# Patient Record
Sex: Female | Born: 1940 | Race: White | Hispanic: No | Smoking: Current every day smoker
Health system: Southern US, Community
[De-identification: ages and names within clinical notes are randomized; demographics above are authoritative.]

## PROBLEM LIST (undated history)

## (undated) DIAGNOSIS — E079 Disorder of thyroid, unspecified: Secondary | ICD-10-CM

## (undated) DIAGNOSIS — I5032 Chronic diastolic (congestive) heart failure: Secondary | ICD-10-CM

## (undated) DIAGNOSIS — R000 Tachycardia, unspecified: Secondary | ICD-10-CM

## (undated) DIAGNOSIS — J449 Chronic obstructive pulmonary disease, unspecified: Secondary | ICD-10-CM

## (undated) DIAGNOSIS — F419 Anxiety disorder, unspecified: Secondary | ICD-10-CM

## (undated) DIAGNOSIS — F329 Major depressive disorder, single episode, unspecified: Secondary | ICD-10-CM

## (undated) DIAGNOSIS — F32A Depression, unspecified: Secondary | ICD-10-CM

## (undated) DIAGNOSIS — E039 Hypothyroidism, unspecified: Secondary | ICD-10-CM

## (undated) DIAGNOSIS — G8929 Other chronic pain: Secondary | ICD-10-CM

## (undated) HISTORY — PX: CHOLECYSTECTOMY: SHX55

## (undated) HISTORY — PX: TONSILLECTOMY: SUR1361

## (undated) HISTORY — PX: ABDOMINAL HYSTERECTOMY: SHX81

---

## 1999-01-16 ENCOUNTER — Inpatient Hospital Stay (HOSPITAL_COMMUNITY): Admission: AD | Admit: 1999-01-16 | Discharge: 1999-01-17 | Payer: Self-pay | Admitting: Internal Medicine

## 1999-01-17 ENCOUNTER — Encounter: Payer: Self-pay | Admitting: Internal Medicine

## 2000-06-05 ENCOUNTER — Encounter: Payer: Self-pay | Admitting: Gastroenterology

## 2000-06-05 ENCOUNTER — Ambulatory Visit (HOSPITAL_COMMUNITY): Admission: RE | Admit: 2000-06-05 | Discharge: 2000-06-05 | Payer: Self-pay | Admitting: Gastroenterology

## 2000-11-02 ENCOUNTER — Emergency Department (HOSPITAL_COMMUNITY): Admission: EM | Admit: 2000-11-02 | Discharge: 2000-11-02 | Payer: Self-pay | Admitting: Emergency Medicine

## 2000-11-02 ENCOUNTER — Encounter: Payer: Self-pay | Admitting: Emergency Medicine

## 2000-11-09 ENCOUNTER — Inpatient Hospital Stay (HOSPITAL_COMMUNITY): Admission: AD | Admit: 2000-11-09 | Discharge: 2000-11-12 | Payer: Self-pay | Admitting: Family Medicine

## 2001-01-27 ENCOUNTER — Ambulatory Visit (HOSPITAL_COMMUNITY): Admission: RE | Admit: 2001-01-27 | Discharge: 2001-01-27 | Payer: Self-pay | Admitting: Internal Medicine

## 2001-01-27 ENCOUNTER — Encounter: Payer: Self-pay | Admitting: Internal Medicine

## 2002-01-19 ENCOUNTER — Encounter: Payer: Self-pay | Admitting: Family Medicine

## 2002-01-19 ENCOUNTER — Ambulatory Visit (HOSPITAL_COMMUNITY): Admission: RE | Admit: 2002-01-19 | Discharge: 2002-01-19 | Payer: Self-pay | Admitting: Family Medicine

## 2003-01-19 ENCOUNTER — Encounter: Payer: Self-pay | Admitting: Emergency Medicine

## 2003-01-19 ENCOUNTER — Inpatient Hospital Stay (HOSPITAL_COMMUNITY): Admission: EM | Admit: 2003-01-19 | Discharge: 2003-01-24 | Payer: Self-pay | Admitting: Emergency Medicine

## 2003-01-24 ENCOUNTER — Encounter: Payer: Self-pay | Admitting: Cardiology

## 2003-04-05 ENCOUNTER — Emergency Department (HOSPITAL_COMMUNITY): Admission: EM | Admit: 2003-04-05 | Discharge: 2003-04-05 | Payer: Self-pay | Admitting: Emergency Medicine

## 2003-06-28 ENCOUNTER — Ambulatory Visit (HOSPITAL_COMMUNITY): Admission: RE | Admit: 2003-06-28 | Discharge: 2003-06-28 | Payer: Self-pay | Admitting: *Deleted

## 2004-06-10 ENCOUNTER — Ambulatory Visit (HOSPITAL_COMMUNITY): Admission: RE | Admit: 2004-06-10 | Discharge: 2004-06-10 | Payer: Self-pay | Admitting: Family Medicine

## 2006-09-16 ENCOUNTER — Emergency Department (HOSPITAL_COMMUNITY): Admission: EM | Admit: 2006-09-16 | Discharge: 2006-09-16 | Payer: Self-pay | Admitting: Emergency Medicine

## 2008-05-28 ENCOUNTER — Emergency Department (HOSPITAL_COMMUNITY): Admission: EM | Admit: 2008-05-28 | Discharge: 2008-05-28 | Payer: Self-pay | Admitting: Emergency Medicine

## 2008-06-09 ENCOUNTER — Ambulatory Visit: Payer: Self-pay | Admitting: Cardiothoracic Surgery

## 2009-05-10 ENCOUNTER — Ambulatory Visit (HOSPITAL_COMMUNITY): Admission: RE | Admit: 2009-05-10 | Discharge: 2009-05-10 | Payer: Self-pay | Admitting: Family Medicine

## 2010-06-02 ENCOUNTER — Encounter: Payer: Self-pay | Admitting: Cardiothoracic Surgery

## 2010-08-26 LAB — COMPREHENSIVE METABOLIC PANEL WITH GFR
ALT: 14 U/L (ref 0–35)
AST: 17 U/L (ref 0–37)
Albumin: 4.1 g/dL (ref 3.5–5.2)
Alkaline Phosphatase: 99 U/L (ref 39–117)
BUN: 12 mg/dL (ref 6–23)
CO2: 29 meq/L (ref 19–32)
Calcium: 9.6 mg/dL (ref 8.4–10.5)
Chloride: 103 meq/L (ref 96–112)
Creatinine, Ser: 0.65 mg/dL (ref 0.4–1.2)
GFR calc non Af Amer: >60 mL/min
Glucose, Bld: 103 mg/dL — ABNORMAL HIGH (ref 70–99)
Potassium: 3.8 meq/L (ref 3.5–5.1)
Sodium: 139 meq/L (ref 135–145)
Total Bilirubin: 0.5 mg/dL (ref 0.3–1.2)
Total Protein: 7.1 g/dL (ref 6.0–8.3)

## 2010-08-26 LAB — DIFFERENTIAL
Eosinophils Absolute: 0.1 10*3/uL (ref 0.0–0.7)
Lymphs Abs: 2.8 10*3/uL (ref 0.7–4.0)
Monocytes Absolute: 0.4 10*3/uL (ref 0.1–1.0)
Monocytes Relative: 5 % (ref 3–12)
Neutrophils Relative %: 62 % (ref 43–77)

## 2010-08-26 LAB — CBC
HCT: 39.5 % (ref 36.0–46.0)
Hemoglobin: 13.1 g/dL (ref 12.0–15.0)
MCHC: 33 g/dL (ref 30.0–36.0)
MCV: 88.4 fL (ref 78.0–100.0)
Platelets: 420 K/uL — ABNORMAL HIGH (ref 150–400)
RBC: 4.47 MIL/uL (ref 3.87–5.11)
RDW: 16.1 % — ABNORMAL HIGH (ref 11.5–15.5)
WBC: 8.7 K/uL (ref 4.0–10.5)

## 2010-09-06 ENCOUNTER — Ambulatory Visit (HOSPITAL_COMMUNITY)
Admission: RE | Admit: 2010-09-06 | Discharge: 2010-09-06 | Disposition: A | Discharge status: Home / Self Care | Payer: Medicare Other | Source: Ambulatory Visit | Attending: Family Medicine | Admitting: Family Medicine

## 2010-09-06 ENCOUNTER — Other Ambulatory Visit (HOSPITAL_COMMUNITY): Payer: Self-pay | Admitting: Family Medicine

## 2010-09-06 DIAGNOSIS — M25519 Pain in unspecified shoulder: Secondary | ICD-10-CM | POA: Insufficient documentation

## 2010-09-06 DIAGNOSIS — M25511 Pain in right shoulder: Secondary | ICD-10-CM

## 2010-09-24 NOTE — Consult Note (Signed)
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  The  patient was recently hospitalized at [facility] with abdominal  pain and a CT scan was performed.  The CT scan of the abdomen showed  some nonspecific findings in the abdomen and a small area of air fluid  levels in the right colon.  The cuts through the thoracic aorta showed a  ascending aorta measuring 4.7 cm x 4.4 cm.  The descending thoracic  aorta measures 2.6 cm.  There is no evidence of dissection, false lumen,  or penetrating ulcer of the aorta.  The abdominal aorta shows no  significant abnormalities.  Because of her abnormal CT scan, a CT  surgical evaluation was requested.   The patient previously has been seen and evaluated for her ascending  aortic disease and in 2004 and 2005, she had CT scan by [facility].  The ascending aorta at this time measured 4.5 cm.  The  patient has been a heavy smoker and has hyperlipidemia and is currently  not taking her Vytorin due to concern over side effects as seen on TV.  She denies any significant chest pain but does have palpitations.  She  denies any significant symptoms of CHF or fluid retention.  She did have  a 2-D echo in 2004 which showed a normal EF and no significant valve  disease.  She also had a left heart cath at that time which showed no  significant coronary disease, but there was some dilatation of the  ascending aorta.   PAST MEDICAL HISTORY:  1. History of DVT in the left leg with pulmonary embolus in 2004      treated with over a year of Coumadin,  now resolved.  2. Hyperlipidemia.  3. GERD.  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  5. Degenerative arthritis.  6. Status post cholecystectomy and abdominal hysterectomy.   CURRENT MEDICATIONS:  1. Doxepin 100 mg nightly.  2. Ativan 2 mg t.i.d.  3. Albuterol inhaler p.r.n.  4. Aspirin a 325 mg daily.  5. Hydrocodone p.r.n. pain.  6. Prevacid 50 mg daily.  7. Vytorin 10/41 daily.  8. Flexeril 10 mg p.r.n.   ALLERGIES/INTOLERANCE:  DARVOCET causes her to have a feeling of  suffocation.   SOCIAL HISTORY:  The patient is married.  She is retired.  He smokes  least a pack of cigarettes a day.   FAMILY HISTORY:  No history of aneurysmal disease.  Positive history of  coronary disease and MI.   REVIEW OF SYSTEMS:  No fever, weight loss, productive cough, or  hemoptysis.  No history of thoracic trauma or abnormal pulmonary nodules  on her x-ray studies.  No active dental problems and she has a full  upper plate and intact remaining mandibular teeth.  No difficulty  swallowing.  No hepatitis, jaundice, or blood per rectum.  No history of  kidney stones or hematuria.  No history of diabetes or thyroid disease.  No history of bleeding complications from prior Coumadin therapy or  history of blood transfusions.  Positive history of DVT in her left leg,  not related to an injury or operation.  Positive history of leg pain at  night.  Negative history of stroke, seizure, or TIA.   PHYSICAL EXAMINATION:  The patient is 5 feet 6 and weighs 131 pounds.  Blood pressure 108/70, pulse 86, respirations 18, saturation room air is  94%.  She is anxious and she is accompanied by her daughter.  HEENT exam  is normocephalic with upper dental plate.  Pharynx clear.  Neck US  without JVD, mass, or bruit.  Lymphatics show no palpable  supraclavicular or cervical adenopathy.  Thorax is without deformity.  Breath sounds are distant, but clear.  Cardiac rhythm is regular without  S3,  gallop, murmur, or rub.  Abdominal exam is soft, well-healed lower  midline incision from her hysterectomy.  There is no pulsatile mass.  Extremities reveal mild clubbing but no cyanosis or edema.  Peripheral  pulses are intact in all extremities.  There is no evidence of venous  insufficiency of the lower extremities.  Her neurologic exam is intact  and she is alert and oriented.   LABORATORY DATA:  Reviewed her CT scans dating back to 2004 at which  time she had a 4.5-cm fusiform ascending aneurysm.  This is not  significantly changed over the past 6 years measuring now 4.6 cm.   IMPRESSION AND RECOMMENDATIONS:  Surgical intervention of a fusiform  ascending aorta were usually reserved for a aortic diameter of 5 cm or  greater unless there is a strong family history of dissection.  I have  recommended smoking cessation and that she resume taking her cholesterol  medication as well as aspirin 81 mg a day and again smoking cessation.  Her ascending aorta has been fairly stable over 6 years, so I will plan  on seeing her back with a CT scan of her thoracic aorta in 1 year.  She  understands that she is at a small but definite risk of dissection of  about 5% and she should report any symptoms of intrascapular back pain  or severe chest pain immediately.  Smoking cessation would also have a  significant positive impact on her operative recovery if she does come  to Surgery.  All questions were addressed and the patient agrees to  return for the CT scan in 1 year's time.   Kerin Perna, M.D.  Electronically Signed   PV/MEDQ  D:  06/09/2008  T:  06/10/2008  Job:  [id number]   cc:   Kirk Ruths, M.D.  [facility] Office

## 2010-09-27 NOTE — H&P (Signed)
NAME:  [name], [name]                      ACCOUNT NO.:  [id number]   MEDICAL RECORD NO.:  [id number]                   PATIENT TYPE:  INP   LOCATION:  A226                                 FACILITY:  [facility]   PHYSICIAN:  Thomas C. Wall, M.D.                DATE OF BIRTH:  1941-03-05   DATE OF ADMISSION:  01/19/2003  DATE OF DISCHARGE:                                HISTORY & PHYSICAL   Mrs. Ashley Houston is a 70 year old white female who has a history of DVD and  pulmonary emboli as well as gastroesophageal reflux.  She reported having  substernal chest pressure and heaviness radiating into her left arm with  left arm numbness and shortness of breath while walking up the stairs  yesterday.  It was relieved with 1 sublingual nitroglycerin by EMS.  It was  relieved completely by 1 sublingual nitroglycerin in the emergency room.   Since admission, she has had a negative EKG except for nonspecific ST  segment changes.  She has had negative cardiac enzymes x 3.   Her risk factors are significant and positive for age, tobacco use, and  hyperlipidemia.   ALLERGIES:  She is intolerant of DARVOCET.   MEDICATIONS PRIOR TO ADMISSION:  1. Prevacid 30 mg a day.  2. Hydrocodone APAP p.r.n.  3. Lorazepam 2 mg p.r.n.  4. Coumadin 5 mg q.h.s.  5. Doxepin HCl 10 mg q.h.s.  6. Albuterol negative q.6h. p.r.n.  7. Lescol 80 mg q.h.s.   She has a history of anxiety.  She became quite tearful today when we talked  to her.   She had a cardiac catheterization in 2000.  She had minor irregularities to  the proximal right coronary artery and normal left ventricular function.  She had a slight dilatation of the proximal distal aorta with normal renal  vessels.   She has had a cholecystectomy and hysterectomy.   SOCIAL HISTORY:  She lives in [place] with her husband.  She has five children.  She is married and retired.  She does not drink.   FAMILY HISTORY:  Remarkable for no premature history of  coronary disease.   REVIEW OF SYSTEMS:  Really unremarkable.  Her reflux symptoms have been  under good control and do not feel similar to what she had yesterday as  described in the HPI.   PHYSICAL EXAMINATION:  VITAL SIGNS:  Blood pressure 106/57, pulse 78 and  regular, temperature 96.7, respiratory rate 20, O2 saturation 91% on 1  liter.  Weight 147/7.  GENERAL:  She is in no acute distress.  She is quite anxious and tearful,  however, after being told she needs catheterization.  HEENT:  Unremarkable.  NECK:  No JVD.  Carotid upstrokes are equal bilaterally without bruits.  There is no thyromegaly.  LUNGS: Relatively clear except for decreased breath sounds throughout.  HEART:  Regular rate and rhythm without gallop, rub, or murmur.  ABDOMEN:  Soft with good bowel sounds.  EXTREMITIES:  No cyanosis, clubbing, or edema.  Pulses are intact.  NEUROLOGIC:  Exam is intact.   LABORATORY DATA:  Chest x-ray shows no acute cardiopulmonary disease.   LABORATORY DATA:  Remarkable for AST and ALT being slightly up.  This may be  secondary to drug.  Alkaline phosphatase and total bilirubin were normal.  Total protein and albumin are normal.   ASSESSMENT:  1. Chest discomfort consistent with angina.  She has significant risk     factors.  She had nonobstructive plaque on catheterization in 2000.     Repeat catheterization has been recommended.  We are waiting for a bed at     [facility].  She and her husband are in agreement with this plan.  2. Hyperlipidemia.  3. Tobacco use.  4. History of deep vein thrombosis and pulmonary emboli on Coumadin.  She     was subtherapeutic on admission.  D-dimer is normal.  5. Gastroesophageal reflux.  This is under good control on her current     medications.  6. Elevated transaminase.  Most likely culprit would be her Lescol.  We will     discontinue this for now and reassess liver function tests again in about     a week.                                                 Thomas C. Wall, M.D.    TCW/MEDQ  D:  01/20/2003  T:  01/21/2003  Job:  [id number]   cc:   Kirk Ruths, M.D.  [address]  [place]  [place] [address]  Fax: [phone]   Duke Salvia, M.D.

## 2010-09-27 NOTE — Discharge Summary (Signed)
   NAME:  EVVA, DIN                      ACCOUNT NO.:  [id number]   MEDICAL RECORD NO.:  [id number]                   PATIENT TYPE:  INP   LOCATION:  4740                                 FACILITY:  [facility]   PHYSICIAN:  Corrie Mckusick, M.D.               DATE OF BIRTH:  [birth year]   DATE OF ADMISSION:  01/19/2003  DATE OF DISCHARGE:  01/23/2003                                 DISCHARGE SUMMARY   DISPOSITION:  Transferred to [facility] on January 23, 2003.   HISTORY OF PRESENT ILLNESS AND PAST MEDICAL HISTORY:  Please see admission  H&P.   HOSPITAL COURSE:  A 70 year old female with history of DVT and pulmonary  embolism with mild chronic obstructive pulmonary disease who presented with  chest pain.  She was admitted for rule out protocol and the [facility]  was consulted.  Catheterization status was reviewed which was negative in  2000.  She was on Coumadin on admission.  CPK and troponins were all  negative x3.   The day after admission the patient had no chest pain and felt quite well.  It was decided after rule out protocol that she should be set up for cardiac  catheterization.  The [place] group set her up for transfer on September 13  for catheterization.  INR had normalized to 1.1 on that day.  Vital signs  had otherwise remained stable and the patient was chest-pain free over the  weekend.   CONDITION ON TRANSFER:  Stable.     ___________________________________________                                         Corrie Mckusick, M.D.   JCG/MEDQ  D:  03/22/2003  T:  03/22/2003  Job:  [id number]

## 2010-09-27 NOTE — Cardiovascular Report (Signed)
Ashley Houston, Ashley Houston                      ACCOUNT NO.:  [id number]   MEDICAL RECORD NO.:  [id number]                   PATIENT TYPE:  INP   LOCATION:  4740                                 FACILITY:  [facility]   PHYSICIAN:  Veneda Melter, M.D.                   DATE OF BIRTH:  11/22/1940   DATE OF PROCEDURE:  01/23/2003  DATE OF DISCHARGE:                              CARDIAC CATHETERIZATION   PROCEDURES PERFORMED:  1. Left heart catheterization.  2. Left ventriculogram.  3. Selective coronary angiography.  4. Aortic root angiogram.  5. Abdominal aortogram.   DIAGNOSES:  1. Mild coronary artery disease by angiogram.  2. Normal left ventricular systolic function.  3. Thoracic aortic aneurysm.   CARDIOLOGIST:  Veneda Melter, M.D.   HISTORY:  Ashley Houston is a 70 year old female with a long history of  tobacco use who presented with substernal chest discomfort.  The patient was  admitted to the hospital and ruled out for acute infarction.  She had a  prior cardiac catheterization in September 2000 showing only mild disease.  She also has a history of deep venous thrombosis requiring anticoagulation  with Coumadin.   TECHNIQUE:  Informed consent was obtained.  The patient was brought to the  catheterization lab. A 6 French sheath was placed in the right femoral  artery using the modified Seldinger technique.  A 6 [place] JL-4 catheter was  introduced and attempts were made to engage the left coronary artery;  however, due to aortic root enlargement this was unsuccessful, and an AL-2  catheter was used to engage the left coronary artery.  Selective angiography  was performed in various projections using manual injection of contrast.  A  JR-4 catheter was then introduced and used to engaged the right coronary  artery; again, selective angiography was performed. Subsequently a 6 French  pigtail catheter was advanced into the left ventricle and left  ventriculogram was performed using  power injection of contrast.  Pigtail  catheter was brought back into the ascending aorta and aortic root angiogram  performed using power injection of contrast.  The pigtail catheter was  brought back into the abdominal aorta and abdominal aortogram performed  using power injection of contrast.   At the termination of the case catheters abdomen sheaths were removed.  Manuel pressure was applied until adequate hemostasis was achieved.   The patient tolerated the procedure well and was transferred to the floor in  stable condition.   FINDINGS:  Findings are as follows:   1. Left Main Trunk:  Large caliber vessel with mild irregularities.   1. LAD:  The LAD is a medium caliber vessel that provides a medium-sized     first diagonal branch in the midsection.  The LAD has mid disease of 30%.   1. Left Circumflex Artery:  This is a large caliber vessel that provides a     medium caliber first marginal  branch in the proximal segment and large     second marginal branch in the midsection.  The left circumflex system had     diffuse disease of 30%.   1. Right Coronary:  Dominant, medium  caliber vessel that provides the     posterior descending artery and several posterior ventricular branches at     the terminal segment.  The right coronary has mild disease of 30% in the     midsection.   1. LV:  Normal end systolic and end diastolic dimensions.  Overall left     ventricular function is well-preserved.  The ejection fraction was     greater than 55%.  No mitral regurgitation.  Left ventricular pressure     124/8, aortic was 125/68 and LVEDP was 15.   1. Aortic Root:  This is dilated.  Evidence of dilatation is noted through     the ascending and descending segments up to the diaphragm.  The origin of     the great vessels appears normal.   1. The abdominal aorta is of normal caliber vessel.  There is moderate     atheromatous disease without critical stenosis or dilatation.  The  renal     arteries are single and patent bilaterally.  The iliac arteries have mild     disease.   ASSESSMENT/PLAN:  Ashley Houston is a 70 year old female with mild coronary  artery disease and well-preserved left ventricular function.  By angiogram  she has dilated aortic root and ascending aorta, which will be sized using  imaging study.  She has had a history of pulmonary embolus; consideration  should be given towards discontinuation of the Coumadin.  Should she be at  high risk for recurrent pulmonary embolus placement of an IVC filter will be  considered.                                                 Veneda Melter, M.D.    NG/MEDQ  D:  01/23/2003  T:  01/23/2003  Job:  [id number]   cc:   Kirk Ruths, M.D.  [address]  [place]  [place] [address]  Fax: [phone]   Jesse Sans. Wall, M.D.   Duke Salvia, M.D.

## 2010-09-27 NOTE — Discharge Summary (Signed)
NAME:  Ashley Houston, Ashley Houston                      ACCOUNT NO.:  [id number]   MEDICAL RECORD NO.:  [id number]                   PATIENT TYPE:  INP   LOCATION:  4740                                 FACILITY:  [facility]   PHYSICIAN:  Thomas C. Wall, M.D.                DATE OF BIRTH:  09-Feb-1941   DATE OF ADMISSION:  01/23/2003  DATE OF DISCHARGE:  01/24/2003                           DISCHARGE SUMMARY - REFERRING   PROCEDURES:  1. Cardiac catheterization.  2. Coronary arteriogram.  3. Left ventriculogram.  4. CT scan of the chest with contrast.   HOSPITAL COURSE:  [name] is a 70 year old female with a history of  DVT as well as pulmonary embolus and reflux disease who went to [facility] for evaluation of chest pain.  She was admitted there and ruled out  for an MI.  She had a catheterization in 2000 which showed moderate  irregularities but otherwise normal coronaries and a normal LV.  At that  time she also had slight dilatation of the proximal and distal aorta but no  renal artery stenosis.  She was evaluated at [facility] and it was  felt that further evaluation was needed so she was transferred to [facility] for cardiac catheterization.   [name] had a cardiac catheterization on January 23, 2003 and it  showed nonobstructive coronary disease in the LAD, circumflex and RCA.  Her  EF was greater than 55% with no MR.  She had mild irregularities in her  abdominal aorta but her ascending aorta was dilated.  CT scan was ordered to  size her thoracic aorta and to rule out PE as well.   The chest CT scan was performed with contrast and it showed no pulmonary  embolus.  There was no dissection.  She had a dilated ascending aorta that  was 4.5 cm in the maximum diameter.   [name] had been on Coumadin prior to admission for her history of PE.  However, this was discontinued and she was started on a full-strength  aspirin every day.  Her  catheter site was without hematoma, ecchymosis or  bruit. She was ambulating without chest pain or shortness of breath and  considered stable for discharge on January 24, 2003.  She will be followed  closely for her aortic root aneurysm with followup CT scans at regular  intervals.   LABORATORY DATA:  Hemoglobin 13.5, hematocrit 40.3, WBC 9.1, platelets 346.  Sodium 139, potassium 3.9, chloride 104, CO2 29, BUN 13, creatinine 0.7,  glucose 96.  Serial CK-MB and troponin are negative for MI.  D-dimer within  normal limits at 0.26.   Chest x-ray (performed at [facility]): Heart size and vascularity are  normal.  Lungs are hyperinflated with slight flattening of the diaphragm and  accentuation of the interstitial markings on a chronic basis.  Findings are  consistent with chronic interstitial and obstructive disease with  no acute  problems.   CONDITION ON DISCHARGE:  Stable.   DISCHARGE DIAGNOSES:  1. Chest pain, negative pulmonary embolus by CT scan and no obstructive     coronary artery disease by catheterization this admission.  2. Preserved left ventricular function with an ejection fraction greater     than 55% by catheterization this admission.  3. History of deep venous thrombosis and pulmonary embolus.  4. Hyperlipidemia.  5. Gastroesophageal reflux disease.  6. Anxiety.  7. Chronic obstructive pulmonary disease/interstitial lung disease.  8. Allergies to DARVOCET.  9. Status post cholecystectomy and hysterectomy.  10.      Arthritis.   DISCHARGE INSTRUCTIONS:  Her activity level is to include no driving, sexual  or strenuous activity for two days.  She was advised not to overexert  herself but it is okay to start a walking program at a low level next week.  She is to stick to a low-fat diet and not use tobacco.  She is to call the  office for problems with the cath site.  She has an appointment with a  physician and she is to check with Dr. Daleen Squibb on September 27 at 2 p.m.   Dr.  Daleen Squibb will determine the frequency of the CT scans for her aneurysm.  She is  to follow up with Dr. Regino Schultze as scheduled.   DISCHARGE MEDICATIONS:  1. Aspirin 325 mg daily.  2. Prevacid 30 mg daily.  3. Ativan 2 mg b.i.d.  4. Doxepin 100 mg q.h.s.  5. Albuterol MDI p.r.n.  6. Lescol 80 mg daily.  7. She is not to take Coumadin.         Lavella Hammock, P.A. [facility]                  Thomas C. Wall, M.D.    RG/MEDQ  D:  01/24/2003  T:  01/24/2003  Job:  [id number]   cc:   Kirk Ruths, M.D.  [address]  [place]  [place] [address]  Fax: [phone]

## 2010-09-27 NOTE — H&P (Signed)
NAME:  Ashley Houston, Ashley Houston                      ACCOUNT NO.:  [id number]   MEDICAL RECORD NO.:  [id number]                   PATIENT TYPE:  INP   LOCATION:  A226                                 FACILITY:  [facility]   PHYSICIAN:  Corrie Mckusick, M.D.               DATE OF BIRTH:  1941/04/16   DATE OF ADMISSION:  01/19/2003  DATE OF DISCHARGE:                                HISTORY & PHYSICAL   ADMITTING DIAGNOSIS:  Chest pain.   HISTORY OF PRESENT ILLNESS:  [age]-year-old female with 2 days of  intermittent left-sided chest pain, some radiation to the left arm, some  associated shortness of breath, no nausea, vomiting, or diaphoresis.  Patient does have some underlying mild COPD.  She has had a DVT and  resultant pulmonary embolism 4 to 5 years ago, and is on Coumadin for that.  She had no acute shortness of breath.  She is on nebs at home.  Respiratory  status is otherwise in pretty good shape for [place].  She had a  catheterization 3 to 4 years ago by the [facility]; it was reportedly  negative.   PAST MEDICAL HISTORY:  GERD, COPD, history of DVT and PE.   PAST SURGICAL HISTORY:  Cholecystectomy, hysterectomy.   MEDICATIONS ON ADMISSION:  1. Prevacid 30 mg q.d.  2. Hydrocodone q.6h. p.r.n.  3. Lorazepam 2 mg q.6h. p.r.n.  4. Coumadin 5 mg q.p.m.  5. Doxepin 100 mg q.h.s.  6. Albuterol nebs q.i.d. p.r.n.   ALLERGIES:  No known drug allergies.   SOCIAL HISTORY:  She smokes.   FAMILY HISTORY:  With some coronary artery disease.   PHYSICAL EXAMINATION:  VITAL SIGNS:  Temp 97.9, pulse 89, respirations 16,  blood pressure 122/71, O2 sat 96% on room air.  GENERAL:  When I saw the patient, she was pleasant and talking in no acute  distress with no chest pain.  HEENT:  Normocephalic, atraumatic.  Pupils are equal and round.  Extraocular  muscles are intact.  Nasopharynx clear.  NECK:  Supple, no lymphadenopathy noted or JVD.  CHEST:  Clear to auscultation bilaterally.  CARDIOVASCULAR:  Regular rate and rhythm with normal S1 and S2, no S3,  murmurs, gallops, or rubs.  ABDOMEN:  Soft, nontender, nondistended.  EXTREMITIES:  No clubbing, cyanosis, or erythema, no edema.   LABORATORIES:  CBC:  White blood cell count 9.1, hemoglobin 13.5, hematocrit  40.3, platelets 346, INR 1.8.  D-dimer 0.26.  Sodium 133, potassium 3.4,  chloride 103, bicarb 28, glucose 96, BUN 9, creatinine 0.7, ALT 54, AST 93,  CPK 68, CK-MB 1.6, troponin 0.01.  Chest x-ray reportedly negative by the  emergency department.   ASSESSMENT:  [age]-year-old female with history of deep vein thrombosis and  pulmonary embolism with mild chronic obstructive pulmonary disease who  presents with chest pain.   PLAN:  1. Admit for rule out protocol.  2. Monitor.  3. [facility]  to see.  4. Will continue home mediations.                                                 Corrie Mckusick, M.D.    Flint Melter  D:  01/20/2003  T:  01/20/2003  Job:  [id number]

## 2011-04-07 DIAGNOSIS — I359 Nonrheumatic aortic valve disorder, unspecified: Secondary | ICD-10-CM

## 2011-04-07 DIAGNOSIS — G459 Transient cerebral ischemic attack, unspecified: Secondary | ICD-10-CM

## 2011-04-21 ENCOUNTER — Other Ambulatory Visit (HOSPITAL_COMMUNITY): Payer: Self-pay | Admitting: Family Medicine

## 2011-04-21 DIAGNOSIS — Z139 Encounter for screening, unspecified: Secondary | ICD-10-CM

## 2011-04-23 ENCOUNTER — Other Ambulatory Visit (HOSPITAL_COMMUNITY): Payer: Medicare Other

## 2011-05-26 ENCOUNTER — Other Ambulatory Visit (HOSPITAL_COMMUNITY): Payer: Medicare Other

## 2011-07-15 ENCOUNTER — Other Ambulatory Visit (HOSPITAL_COMMUNITY): Payer: Medicare Other

## 2011-07-22 ENCOUNTER — Other Ambulatory Visit (HOSPITAL_COMMUNITY): Payer: Medicare Other

## 2011-11-28 DIAGNOSIS — R079 Chest pain, unspecified: Secondary | ICD-10-CM

## 2011-12-31 ENCOUNTER — Other Ambulatory Visit (HOSPITAL_COMMUNITY): Payer: Self-pay | Admitting: Family Medicine

## 2011-12-31 DIAGNOSIS — Z139 Encounter for screening, unspecified: Secondary | ICD-10-CM

## 2012-01-06 ENCOUNTER — Ambulatory Visit (HOSPITAL_COMMUNITY): Payer: Medicare Other

## 2012-01-06 ENCOUNTER — Other Ambulatory Visit (HOSPITAL_COMMUNITY): Payer: Medicare Other

## 2012-01-15 ENCOUNTER — Ambulatory Visit (HOSPITAL_COMMUNITY)
Admission: RE | Admit: 2012-01-15 | Discharge: 2012-01-15 | Disposition: A | Discharge status: Home / Self Care | Payer: Medicare Other | Source: Ambulatory Visit | Attending: Family Medicine | Admitting: Family Medicine

## 2012-01-15 DIAGNOSIS — Z139 Encounter for screening, unspecified: Secondary | ICD-10-CM

## 2012-01-15 DIAGNOSIS — Z1231 Encounter for screening mammogram for malignant neoplasm of breast: Secondary | ICD-10-CM | POA: Insufficient documentation

## 2012-01-15 DIAGNOSIS — M899 Disorder of bone, unspecified: Secondary | ICD-10-CM | POA: Insufficient documentation

## 2012-01-15 DIAGNOSIS — M949 Disorder of cartilage, unspecified: Secondary | ICD-10-CM | POA: Insufficient documentation

## 2012-11-08 ENCOUNTER — Ambulatory Visit (HOSPITAL_COMMUNITY)
Admission: RE | Admit: 2012-11-08 | Discharge: 2012-11-08 | Disposition: A | Discharge status: Home / Self Care | Payer: Medicare Other | Source: Ambulatory Visit | Attending: Family Medicine | Admitting: Family Medicine

## 2012-11-08 ENCOUNTER — Other Ambulatory Visit (HOSPITAL_COMMUNITY): Payer: Self-pay | Admitting: Family Medicine

## 2012-11-08 DIAGNOSIS — G8929 Other chronic pain: Secondary | ICD-10-CM

## 2012-11-08 DIAGNOSIS — S99929A Unspecified injury of unspecified foot, initial encounter: Secondary | ICD-10-CM | POA: Insufficient documentation

## 2012-11-08 DIAGNOSIS — M25579 Pain in unspecified ankle and joints of unspecified foot: Secondary | ICD-10-CM | POA: Insufficient documentation

## 2012-11-08 DIAGNOSIS — S8990XA Unspecified injury of unspecified lower leg, initial encounter: Secondary | ICD-10-CM | POA: Insufficient documentation

## 2012-11-08 DIAGNOSIS — X500XXA Overexertion from strenuous movement or load, initial encounter: Secondary | ICD-10-CM | POA: Insufficient documentation

## 2012-12-27 ENCOUNTER — Other Ambulatory Visit (HOSPITAL_COMMUNITY): Payer: Self-pay | Admitting: Family Medicine

## 2012-12-27 ENCOUNTER — Ambulatory Visit (HOSPITAL_COMMUNITY)
Admission: RE | Admit: 2012-12-27 | Discharge: 2012-12-27 | Disposition: A | Discharge status: Home / Self Care | Payer: Medicare Other | Source: Ambulatory Visit | Attending: Family Medicine | Admitting: Family Medicine

## 2012-12-27 DIAGNOSIS — I517 Cardiomegaly: Secondary | ICD-10-CM | POA: Insufficient documentation

## 2012-12-27 DIAGNOSIS — M549 Dorsalgia, unspecified: Secondary | ICD-10-CM

## 2012-12-27 DIAGNOSIS — J449 Chronic obstructive pulmonary disease, unspecified: Secondary | ICD-10-CM | POA: Insufficient documentation

## 2012-12-27 DIAGNOSIS — R0789 Other chest pain: Secondary | ICD-10-CM | POA: Insufficient documentation

## 2012-12-27 DIAGNOSIS — J4489 Other specified chronic obstructive pulmonary disease: Secondary | ICD-10-CM | POA: Insufficient documentation

## 2014-04-25 ENCOUNTER — Other Ambulatory Visit (HOSPITAL_COMMUNITY): Payer: Self-pay | Admitting: Physician Assistant

## 2014-04-25 DIAGNOSIS — J029 Acute pharyngitis, unspecified: Secondary | ICD-10-CM

## 2014-04-25 DIAGNOSIS — R634 Abnormal weight loss: Secondary | ICD-10-CM

## 2014-05-23 DIAGNOSIS — H2513 Age-related nuclear cataract, bilateral: Secondary | ICD-10-CM | POA: Diagnosis not present

## 2014-05-23 DIAGNOSIS — H5203 Hypermetropia, bilateral: Secondary | ICD-10-CM | POA: Diagnosis not present

## 2014-05-23 DIAGNOSIS — H524 Presbyopia: Secondary | ICD-10-CM | POA: Diagnosis not present

## 2014-05-23 DIAGNOSIS — H52223 Regular astigmatism, bilateral: Secondary | ICD-10-CM | POA: Diagnosis not present

## 2014-05-31 DIAGNOSIS — Z6821 Body mass index (BMI) 21.0-21.9, adult: Secondary | ICD-10-CM | POA: Diagnosis not present

## 2014-05-31 DIAGNOSIS — E782 Mixed hyperlipidemia: Secondary | ICD-10-CM | POA: Diagnosis not present

## 2014-05-31 DIAGNOSIS — H269 Unspecified cataract: Secondary | ICD-10-CM | POA: Diagnosis not present

## 2014-05-31 DIAGNOSIS — I10 Essential (primary) hypertension: Secondary | ICD-10-CM | POA: Diagnosis not present

## 2014-06-20 DIAGNOSIS — M5410 Radiculopathy, site unspecified: Secondary | ICD-10-CM | POA: Diagnosis not present

## 2014-06-20 DIAGNOSIS — M4806 Spinal stenosis, lumbar region: Secondary | ICD-10-CM | POA: Diagnosis not present

## 2014-06-20 DIAGNOSIS — M7062 Trochanteric bursitis, left hip: Secondary | ICD-10-CM | POA: Diagnosis not present

## 2014-06-23 DIAGNOSIS — M5116 Intervertebral disc disorders with radiculopathy, lumbar region: Secondary | ICD-10-CM | POA: Diagnosis not present

## 2014-06-23 DIAGNOSIS — M4806 Spinal stenosis, lumbar region: Secondary | ICD-10-CM | POA: Diagnosis not present

## 2014-06-23 DIAGNOSIS — M5117 Intervertebral disc disorders with radiculopathy, lumbosacral region: Secondary | ICD-10-CM | POA: Diagnosis not present

## 2014-06-23 DIAGNOSIS — M47816 Spondylosis without myelopathy or radiculopathy, lumbar region: Secondary | ICD-10-CM | POA: Diagnosis not present

## 2014-06-23 DIAGNOSIS — M5126 Other intervertebral disc displacement, lumbar region: Secondary | ICD-10-CM | POA: Diagnosis not present

## 2014-06-27 DIAGNOSIS — M5126 Other intervertebral disc displacement, lumbar region: Secondary | ICD-10-CM | POA: Diagnosis not present

## 2014-06-27 DIAGNOSIS — M4806 Spinal stenosis, lumbar region: Secondary | ICD-10-CM | POA: Diagnosis not present

## 2014-06-28 DIAGNOSIS — M5136 Other intervertebral disc degeneration, lumbar region: Secondary | ICD-10-CM | POA: Diagnosis not present

## 2014-06-28 DIAGNOSIS — G894 Chronic pain syndrome: Secondary | ICD-10-CM | POA: Diagnosis not present

## 2014-06-28 DIAGNOSIS — Z6821 Body mass index (BMI) 21.0-21.9, adult: Secondary | ICD-10-CM | POA: Diagnosis not present

## 2014-07-10 DIAGNOSIS — Z6820 Body mass index (BMI) 20.0-20.9, adult: Secondary | ICD-10-CM | POA: Diagnosis not present

## 2014-07-10 DIAGNOSIS — R102 Pelvic and perineal pain: Secondary | ICD-10-CM | POA: Diagnosis not present

## 2014-07-28 DIAGNOSIS — G894 Chronic pain syndrome: Secondary | ICD-10-CM | POA: Diagnosis not present

## 2014-07-28 DIAGNOSIS — Z6821 Body mass index (BMI) 21.0-21.9, adult: Secondary | ICD-10-CM | POA: Diagnosis not present

## 2014-08-03 DIAGNOSIS — H5211 Myopia, right eye: Secondary | ICD-10-CM | POA: Diagnosis not present

## 2014-08-03 DIAGNOSIS — H538 Other visual disturbances: Secondary | ICD-10-CM | POA: Diagnosis not present

## 2014-08-21 DIAGNOSIS — H524 Presbyopia: Secondary | ICD-10-CM | POA: Diagnosis not present

## 2014-08-21 DIAGNOSIS — J449 Chronic obstructive pulmonary disease, unspecified: Secondary | ICD-10-CM | POA: Diagnosis not present

## 2014-08-21 DIAGNOSIS — H2513 Age-related nuclear cataract, bilateral: Secondary | ICD-10-CM | POA: Diagnosis not present

## 2014-08-21 DIAGNOSIS — Z7982 Long term (current) use of aspirin: Secondary | ICD-10-CM | POA: Diagnosis not present

## 2014-08-21 DIAGNOSIS — H5200 Hypermetropia, unspecified eye: Secondary | ICD-10-CM | POA: Diagnosis not present

## 2014-08-21 DIAGNOSIS — H52209 Unspecified astigmatism, unspecified eye: Secondary | ICD-10-CM | POA: Diagnosis not present

## 2014-08-21 DIAGNOSIS — Z79899 Other long term (current) drug therapy: Secondary | ICD-10-CM | POA: Diagnosis not present

## 2014-08-21 DIAGNOSIS — H2511 Age-related nuclear cataract, right eye: Secondary | ICD-10-CM | POA: Diagnosis not present

## 2014-08-21 DIAGNOSIS — Z888 Allergy status to other drugs, medicaments and biological substances status: Secondary | ICD-10-CM | POA: Diagnosis not present

## 2014-08-21 DIAGNOSIS — H538 Other visual disturbances: Secondary | ICD-10-CM | POA: Diagnosis not present

## 2014-08-25 DIAGNOSIS — M810 Age-related osteoporosis without current pathological fracture: Secondary | ICD-10-CM | POA: Diagnosis not present

## 2014-08-25 DIAGNOSIS — M5136 Other intervertebral disc degeneration, lumbar region: Secondary | ICD-10-CM | POA: Diagnosis not present

## 2014-08-25 DIAGNOSIS — M5126 Other intervertebral disc displacement, lumbar region: Secondary | ICD-10-CM | POA: Diagnosis not present

## 2014-08-25 DIAGNOSIS — M4806 Spinal stenosis, lumbar region: Secondary | ICD-10-CM | POA: Diagnosis not present

## 2014-08-25 DIAGNOSIS — M47816 Spondylosis without myelopathy or radiculopathy, lumbar region: Secondary | ICD-10-CM | POA: Diagnosis not present

## 2014-09-11 ENCOUNTER — Other Ambulatory Visit (HOSPITAL_COMMUNITY): Payer: Self-pay | Admitting: Physician Assistant

## 2014-09-11 DIAGNOSIS — J449 Chronic obstructive pulmonary disease, unspecified: Secondary | ICD-10-CM | POA: Diagnosis not present

## 2014-09-11 DIAGNOSIS — Z6820 Body mass index (BMI) 20.0-20.9, adult: Secondary | ICD-10-CM | POA: Diagnosis not present

## 2014-09-11 DIAGNOSIS — Z1231 Encounter for screening mammogram for malignant neoplasm of breast: Secondary | ICD-10-CM

## 2014-09-11 DIAGNOSIS — Z0000 Encounter for general adult medical examination without abnormal findings: Secondary | ICD-10-CM | POA: Diagnosis not present

## 2014-09-11 DIAGNOSIS — Z1389 Encounter for screening for other disorder: Secondary | ICD-10-CM | POA: Diagnosis not present

## 2014-09-12 DIAGNOSIS — Z6820 Body mass index (BMI) 20.0-20.9, adult: Secondary | ICD-10-CM | POA: Diagnosis not present

## 2014-09-12 DIAGNOSIS — J449 Chronic obstructive pulmonary disease, unspecified: Secondary | ICD-10-CM | POA: Diagnosis not present

## 2014-09-12 DIAGNOSIS — Z0000 Encounter for general adult medical examination without abnormal findings: Secondary | ICD-10-CM | POA: Diagnosis not present

## 2014-09-14 ENCOUNTER — Ambulatory Visit (HOSPITAL_COMMUNITY): Payer: Self-pay

## 2014-09-14 DIAGNOSIS — Z780 Asymptomatic menopausal state: Secondary | ICD-10-CM | POA: Diagnosis not present

## 2014-09-14 DIAGNOSIS — M85851 Other specified disorders of bone density and structure, right thigh: Secondary | ICD-10-CM | POA: Diagnosis not present

## 2014-09-14 DIAGNOSIS — M8580 Other specified disorders of bone density and structure, unspecified site: Secondary | ICD-10-CM | POA: Diagnosis not present

## 2014-09-14 DIAGNOSIS — Z79899 Other long term (current) drug therapy: Secondary | ICD-10-CM | POA: Diagnosis not present

## 2014-09-14 DIAGNOSIS — M85852 Other specified disorders of bone density and structure, left thigh: Secondary | ICD-10-CM | POA: Diagnosis not present

## 2014-09-14 DIAGNOSIS — F17200 Nicotine dependence, unspecified, uncomplicated: Secondary | ICD-10-CM | POA: Diagnosis not present

## 2014-09-14 DIAGNOSIS — E2839 Other primary ovarian failure: Secondary | ICD-10-CM | POA: Diagnosis not present

## 2014-09-14 DIAGNOSIS — Z7982 Long term (current) use of aspirin: Secondary | ICD-10-CM | POA: Diagnosis not present

## 2014-09-14 DIAGNOSIS — M8589 Other specified disorders of bone density and structure, multiple sites: Secondary | ICD-10-CM | POA: Diagnosis not present

## 2014-09-18 DIAGNOSIS — Z7982 Long term (current) use of aspirin: Secondary | ICD-10-CM | POA: Diagnosis not present

## 2014-09-18 DIAGNOSIS — Z888 Allergy status to other drugs, medicaments and biological substances status: Secondary | ICD-10-CM | POA: Diagnosis not present

## 2014-09-18 DIAGNOSIS — R42 Dizziness and giddiness: Secondary | ICD-10-CM | POA: Diagnosis not present

## 2014-09-18 DIAGNOSIS — F17210 Nicotine dependence, cigarettes, uncomplicated: Secondary | ICD-10-CM | POA: Diagnosis not present

## 2014-09-18 DIAGNOSIS — R072 Precordial pain: Secondary | ICD-10-CM | POA: Diagnosis not present

## 2014-09-18 DIAGNOSIS — J439 Emphysema, unspecified: Secondary | ICD-10-CM | POA: Diagnosis not present

## 2014-09-18 DIAGNOSIS — R918 Other nonspecific abnormal finding of lung field: Secondary | ICD-10-CM | POA: Diagnosis not present

## 2014-09-18 DIAGNOSIS — R404 Transient alteration of awareness: Secondary | ICD-10-CM | POA: Diagnosis not present

## 2014-09-18 DIAGNOSIS — R531 Weakness: Secondary | ICD-10-CM | POA: Diagnosis not present

## 2014-09-18 DIAGNOSIS — R079 Chest pain, unspecified: Secondary | ICD-10-CM | POA: Diagnosis not present

## 2014-09-18 DIAGNOSIS — I719 Aortic aneurysm of unspecified site, without rupture: Secondary | ICD-10-CM | POA: Diagnosis not present

## 2014-09-18 DIAGNOSIS — I2510 Atherosclerotic heart disease of native coronary artery without angina pectoris: Secondary | ICD-10-CM | POA: Diagnosis not present

## 2014-09-18 DIAGNOSIS — I10 Essential (primary) hypertension: Secondary | ICD-10-CM | POA: Diagnosis not present

## 2014-09-18 DIAGNOSIS — I712 Thoracic aortic aneurysm, without rupture: Secondary | ICD-10-CM | POA: Diagnosis not present

## 2014-09-21 DIAGNOSIS — K625 Hemorrhage of anus and rectum: Secondary | ICD-10-CM | POA: Diagnosis not present

## 2014-09-21 DIAGNOSIS — F329 Major depressive disorder, single episode, unspecified: Secondary | ICD-10-CM | POA: Diagnosis not present

## 2014-09-21 DIAGNOSIS — Z6821 Body mass index (BMI) 21.0-21.9, adult: Secondary | ICD-10-CM | POA: Diagnosis not present

## 2014-09-21 DIAGNOSIS — Z0000 Encounter for general adult medical examination without abnormal findings: Secondary | ICD-10-CM | POA: Diagnosis not present

## 2014-09-27 ENCOUNTER — Ambulatory Visit (HOSPITAL_COMMUNITY): Payer: Self-pay

## 2014-09-27 DIAGNOSIS — M5126 Other intervertebral disc displacement, lumbar region: Secondary | ICD-10-CM | POA: Diagnosis not present

## 2014-09-27 DIAGNOSIS — M810 Age-related osteoporosis without current pathological fracture: Secondary | ICD-10-CM | POA: Diagnosis not present

## 2014-09-27 DIAGNOSIS — M4806 Spinal stenosis, lumbar region: Secondary | ICD-10-CM | POA: Diagnosis not present

## 2014-09-27 DIAGNOSIS — M47816 Spondylosis without myelopathy or radiculopathy, lumbar region: Secondary | ICD-10-CM | POA: Diagnosis not present

## 2014-10-24 DIAGNOSIS — Z6820 Body mass index (BMI) 20.0-20.9, adult: Secondary | ICD-10-CM | POA: Diagnosis not present

## 2014-10-24 DIAGNOSIS — G894 Chronic pain syndrome: Secondary | ICD-10-CM | POA: Diagnosis not present

## 2014-11-20 DIAGNOSIS — M4806 Spinal stenosis, lumbar region: Secondary | ICD-10-CM | POA: Diagnosis not present

## 2014-11-20 DIAGNOSIS — M7062 Trochanteric bursitis, left hip: Secondary | ICD-10-CM | POA: Diagnosis not present

## 2014-12-04 DIAGNOSIS — M47816 Spondylosis without myelopathy or radiculopathy, lumbar region: Secondary | ICD-10-CM | POA: Diagnosis not present

## 2014-12-04 DIAGNOSIS — M4806 Spinal stenosis, lumbar region: Secondary | ICD-10-CM | POA: Diagnosis not present

## 2015-01-17 DIAGNOSIS — G894 Chronic pain syndrome: Secondary | ICD-10-CM | POA: Diagnosis not present

## 2015-01-17 DIAGNOSIS — Z1389 Encounter for screening for other disorder: Secondary | ICD-10-CM | POA: Diagnosis not present

## 2015-01-17 DIAGNOSIS — Z6820 Body mass index (BMI) 20.0-20.9, adult: Secondary | ICD-10-CM | POA: Diagnosis not present

## 2015-01-17 DIAGNOSIS — E063 Autoimmune thyroiditis: Secondary | ICD-10-CM | POA: Diagnosis not present

## 2015-02-05 DIAGNOSIS — J439 Emphysema, unspecified: Secondary | ICD-10-CM | POA: Diagnosis not present

## 2015-02-05 DIAGNOSIS — I712 Thoracic aortic aneurysm, without rupture: Secondary | ICD-10-CM | POA: Diagnosis not present

## 2015-02-07 DIAGNOSIS — I712 Thoracic aortic aneurysm, without rupture: Secondary | ICD-10-CM | POA: Diagnosis not present

## 2015-02-16 DIAGNOSIS — M4806 Spinal stenosis, lumbar region: Secondary | ICD-10-CM | POA: Diagnosis not present

## 2015-02-16 DIAGNOSIS — M7062 Trochanteric bursitis, left hip: Secondary | ICD-10-CM | POA: Diagnosis not present

## 2015-02-21 DIAGNOSIS — I2510 Atherosclerotic heart disease of native coronary artery without angina pectoris: Secondary | ICD-10-CM | POA: Diagnosis not present

## 2015-02-21 DIAGNOSIS — E785 Hyperlipidemia, unspecified: Secondary | ICD-10-CM | POA: Diagnosis not present

## 2015-02-21 DIAGNOSIS — J449 Chronic obstructive pulmonary disease, unspecified: Secondary | ICD-10-CM | POA: Diagnosis not present

## 2015-02-21 DIAGNOSIS — I350 Nonrheumatic aortic (valve) stenosis: Secondary | ICD-10-CM | POA: Diagnosis not present

## 2015-02-26 DIAGNOSIS — K047 Periapical abscess without sinus: Secondary | ICD-10-CM | POA: Diagnosis not present

## 2015-02-26 DIAGNOSIS — Z1389 Encounter for screening for other disorder: Secondary | ICD-10-CM | POA: Diagnosis not present

## 2015-02-26 DIAGNOSIS — Z6820 Body mass index (BMI) 20.0-20.9, adult: Secondary | ICD-10-CM | POA: Diagnosis not present

## 2015-02-27 DIAGNOSIS — I2510 Atherosclerotic heart disease of native coronary artery without angina pectoris: Secondary | ICD-10-CM | POA: Diagnosis not present

## 2015-02-27 DIAGNOSIS — I350 Nonrheumatic aortic (valve) stenosis: Secondary | ICD-10-CM | POA: Diagnosis not present

## 2015-03-15 DIAGNOSIS — J449 Chronic obstructive pulmonary disease, unspecified: Secondary | ICD-10-CM | POA: Diagnosis not present

## 2015-03-15 DIAGNOSIS — Z86718 Personal history of other venous thrombosis and embolism: Secondary | ICD-10-CM | POA: Diagnosis not present

## 2015-03-15 DIAGNOSIS — I2510 Atherosclerotic heart disease of native coronary artery without angina pectoris: Secondary | ICD-10-CM | POA: Diagnosis not present

## 2015-03-15 DIAGNOSIS — R002 Palpitations: Secondary | ICD-10-CM | POA: Diagnosis not present

## 2015-03-15 DIAGNOSIS — I350 Nonrheumatic aortic (valve) stenosis: Secondary | ICD-10-CM | POA: Diagnosis not present

## 2015-03-15 DIAGNOSIS — R0602 Shortness of breath: Secondary | ICD-10-CM | POA: Diagnosis not present

## 2015-03-15 DIAGNOSIS — I739 Peripheral vascular disease, unspecified: Secondary | ICD-10-CM | POA: Diagnosis not present

## 2015-03-15 DIAGNOSIS — E785 Hyperlipidemia, unspecified: Secondary | ICD-10-CM | POA: Diagnosis not present

## 2015-03-15 DIAGNOSIS — F17210 Nicotine dependence, cigarettes, uncomplicated: Secondary | ICD-10-CM | POA: Diagnosis not present

## 2015-03-21 DIAGNOSIS — I712 Thoracic aortic aneurysm, without rupture: Secondary | ICD-10-CM | POA: Diagnosis not present

## 2015-03-21 DIAGNOSIS — I2510 Atherosclerotic heart disease of native coronary artery without angina pectoris: Secondary | ICD-10-CM | POA: Diagnosis not present

## 2015-03-21 DIAGNOSIS — I350 Nonrheumatic aortic (valve) stenosis: Secondary | ICD-10-CM | POA: Diagnosis not present

## 2015-04-10 DIAGNOSIS — Z6820 Body mass index (BMI) 20.0-20.9, adult: Secondary | ICD-10-CM | POA: Diagnosis not present

## 2015-04-10 DIAGNOSIS — Z1389 Encounter for screening for other disorder: Secondary | ICD-10-CM | POA: Diagnosis not present

## 2015-04-10 DIAGNOSIS — I2510 Atherosclerotic heart disease of native coronary artery without angina pectoris: Secondary | ICD-10-CM | POA: Diagnosis not present

## 2015-04-10 DIAGNOSIS — G894 Chronic pain syndrome: Secondary | ICD-10-CM | POA: Diagnosis not present

## 2015-04-17 DIAGNOSIS — I712 Thoracic aortic aneurysm, without rupture: Secondary | ICD-10-CM | POA: Diagnosis not present

## 2015-04-17 DIAGNOSIS — Z01810 Encounter for preprocedural cardiovascular examination: Secondary | ICD-10-CM | POA: Diagnosis not present

## 2015-04-17 DIAGNOSIS — I6523 Occlusion and stenosis of bilateral carotid arteries: Secondary | ICD-10-CM | POA: Diagnosis not present

## 2015-06-04 DIAGNOSIS — M7062 Trochanteric bursitis, left hip: Secondary | ICD-10-CM | POA: Diagnosis not present

## 2015-06-04 DIAGNOSIS — Z6820 Body mass index (BMI) 20.0-20.9, adult: Secondary | ICD-10-CM | POA: Diagnosis not present

## 2015-06-04 DIAGNOSIS — Z1389 Encounter for screening for other disorder: Secondary | ICD-10-CM | POA: Diagnosis not present

## 2015-07-16 DIAGNOSIS — Z1389 Encounter for screening for other disorder: Secondary | ICD-10-CM | POA: Diagnosis not present

## 2015-07-16 DIAGNOSIS — Z6820 Body mass index (BMI) 20.0-20.9, adult: Secondary | ICD-10-CM | POA: Diagnosis not present

## 2015-07-16 DIAGNOSIS — J449 Chronic obstructive pulmonary disease, unspecified: Secondary | ICD-10-CM | POA: Diagnosis not present

## 2015-07-16 DIAGNOSIS — G894 Chronic pain syndrome: Secondary | ICD-10-CM | POA: Diagnosis not present

## 2015-08-14 DIAGNOSIS — R0789 Other chest pain: Secondary | ICD-10-CM | POA: Diagnosis not present

## 2015-08-15 ENCOUNTER — Inpatient Hospital Stay (HOSPITAL_COMMUNITY)
Admission: EM | Admit: 2015-08-15 | Discharge: 2015-08-18 | DRG: 190 | Disposition: A | Discharge status: Home / Self Care | Payer: Commercial Managed Care - HMO | Attending: Internal Medicine | Admitting: Internal Medicine

## 2015-08-15 ENCOUNTER — Emergency Department (HOSPITAL_COMMUNITY): Payer: Commercial Managed Care - HMO

## 2015-08-15 ENCOUNTER — Encounter (HOSPITAL_COMMUNITY): Payer: Self-pay | Admitting: Emergency Medicine

## 2015-08-15 DIAGNOSIS — Z720 Tobacco use: Secondary | ICD-10-CM | POA: Diagnosis present

## 2015-08-15 DIAGNOSIS — I110 Hypertensive heart disease with heart failure: Secondary | ICD-10-CM | POA: Diagnosis present

## 2015-08-15 DIAGNOSIS — J440 Chronic obstructive pulmonary disease with acute lower respiratory infection: Secondary | ICD-10-CM | POA: Diagnosis not present

## 2015-08-15 DIAGNOSIS — F17210 Nicotine dependence, cigarettes, uncomplicated: Secondary | ICD-10-CM | POA: Diagnosis not present

## 2015-08-15 DIAGNOSIS — E039 Hypothyroidism, unspecified: Secondary | ICD-10-CM | POA: Diagnosis not present

## 2015-08-15 DIAGNOSIS — I5040 Unspecified combined systolic (congestive) and diastolic (congestive) heart failure: Secondary | ICD-10-CM | POA: Diagnosis not present

## 2015-08-15 DIAGNOSIS — J438 Other emphysema: Secondary | ICD-10-CM | POA: Diagnosis not present

## 2015-08-15 DIAGNOSIS — R0789 Other chest pain: Secondary | ICD-10-CM

## 2015-08-15 DIAGNOSIS — E876 Hypokalemia: Secondary | ICD-10-CM | POA: Diagnosis not present

## 2015-08-15 DIAGNOSIS — I5032 Chronic diastolic (congestive) heart failure: Secondary | ICD-10-CM | POA: Diagnosis not present

## 2015-08-15 DIAGNOSIS — J449 Chronic obstructive pulmonary disease, unspecified: Secondary | ICD-10-CM | POA: Diagnosis not present

## 2015-08-15 DIAGNOSIS — Z7982 Long term (current) use of aspirin: Secondary | ICD-10-CM | POA: Diagnosis not present

## 2015-08-15 DIAGNOSIS — K219 Gastro-esophageal reflux disease without esophagitis: Secondary | ICD-10-CM | POA: Diagnosis not present

## 2015-08-15 DIAGNOSIS — E079 Disorder of thyroid, unspecified: Secondary | ICD-10-CM | POA: Diagnosis present

## 2015-08-15 DIAGNOSIS — R079 Chest pain, unspecified: Secondary | ICD-10-CM | POA: Diagnosis not present

## 2015-08-15 DIAGNOSIS — R0902 Hypoxemia: Secondary | ICD-10-CM

## 2015-08-15 DIAGNOSIS — J439 Emphysema, unspecified: Secondary | ICD-10-CM | POA: Diagnosis present

## 2015-08-15 DIAGNOSIS — J441 Chronic obstructive pulmonary disease with (acute) exacerbation: Secondary | ICD-10-CM

## 2015-08-15 DIAGNOSIS — R0781 Pleurodynia: Secondary | ICD-10-CM

## 2015-08-15 DIAGNOSIS — J189 Pneumonia, unspecified organism: Secondary | ICD-10-CM

## 2015-08-15 DIAGNOSIS — R0600 Dyspnea, unspecified: Secondary | ICD-10-CM | POA: Diagnosis not present

## 2015-08-15 HISTORY — DX: Major depressive disorder, single episode, unspecified: F32.9

## 2015-08-15 HISTORY — DX: Tachycardia, unspecified: R00.0

## 2015-08-15 HISTORY — DX: Anxiety disorder, unspecified: F41.9

## 2015-08-15 HISTORY — DX: Depression, unspecified: F32.A

## 2015-08-15 HISTORY — DX: Chronic obstructive pulmonary disease, unspecified: J44.9

## 2015-08-15 HISTORY — DX: Disorder of thyroid, unspecified: E07.9

## 2015-08-15 HISTORY — DX: Other chronic pain: G89.29

## 2015-08-15 LAB — CBC WITH DIFFERENTIAL/PLATELET
Basophils Absolute: 0.1 10*3/uL (ref 0.0–0.1)
Basophils Relative: 0 %
EOS ABS: 0.2 10*3/uL (ref 0.0–0.7)
EOS PCT: 2 %
HCT: 34.6 % — ABNORMAL LOW (ref 36.0–46.0)
Hemoglobin: 11.2 g/dL — ABNORMAL LOW (ref 12.0–15.0)
LYMPHS ABS: 2.3 10*3/uL (ref 0.7–4.0)
Lymphocytes Relative: 15 %
MCH: 27.7 pg (ref 26.0–34.0)
MCHC: 32.4 g/dL (ref 30.0–36.0)
MCV: 85.4 fL (ref 78.0–100.0)
MONO ABS: 1.7 10*3/uL — AB (ref 0.1–1.0)
Monocytes Relative: 11 %
Neutro Abs: 10.9 10*3/uL — ABNORMAL HIGH (ref 1.7–7.7)
Neutrophils Relative %: 72 %
PLATELETS: 228 10*3/uL (ref 150–400)
RBC: 4.05 MIL/uL (ref 3.87–5.11)
RDW: 17.2 % — AB (ref 11.5–15.5)
WBC: 15.2 10*3/uL — AB (ref 4.0–10.5)

## 2015-08-15 LAB — COMPREHENSIVE METABOLIC PANEL
ALT: 12 U/L — AB (ref 14–54)
AST: 17 U/L (ref 15–41)
Albumin: 3.5 g/dL (ref 3.5–5.0)
Alkaline Phosphatase: 87 U/L (ref 38–126)
Anion gap: 8 (ref 5–15)
BILIRUBIN TOTAL: 0.5 mg/dL (ref 0.3–1.2)
BUN: 19 mg/dL (ref 6–20)
CHLORIDE: 100 mmol/L — AB (ref 101–111)
CO2: 28 mmol/L (ref 22–32)
CREATININE: 0.73 mg/dL (ref 0.44–1.00)
Calcium: 8.5 mg/dL — ABNORMAL LOW (ref 8.9–10.3)
GFR calc Af Amer: >60 mL/min (ref >60)
Glucose, Bld: 132 mg/dL — ABNORMAL HIGH (ref 65–99)
Potassium: 3.5 mmol/L (ref 3.5–5.1)
Sodium: 136 mmol/L (ref 135–145)
Total Protein: 7.4 g/dL (ref 6.5–8.1)

## 2015-08-15 LAB — URINALYSIS, ROUTINE W REFLEX MICROSCOPIC
Bilirubin Urine: NEGATIVE
Glucose, UA: NEGATIVE mg/dL
Hgb urine dipstick: NEGATIVE
KETONES UR: NEGATIVE mg/dL
LEUKOCYTES UA: NEGATIVE
NITRITE: NEGATIVE
PH: 6 (ref 5.0–8.0)
Protein, ur: NEGATIVE mg/dL
SPECIFIC GRAVITY, URINE: 1.02 (ref 1.005–1.030)

## 2015-08-15 LAB — INFLUENZA PANEL BY PCR (TYPE A & B)
H1N1 flu by pcr: NOT DETECTED
Influenza A By PCR: NEGATIVE
Influenza B By PCR: NEGATIVE

## 2015-08-15 LAB — TSH: TSH: 1.859 u[IU]/mL (ref 0.350–4.500)

## 2015-08-15 LAB — LACTIC ACID, PLASMA: Lactic Acid, Venous: 0.9 mmol/L (ref 0.5–2.0)

## 2015-08-15 LAB — STREP PNEUMONIAE URINARY ANTIGEN: STREP PNEUMO URINARY ANTIGEN: NEGATIVE

## 2015-08-15 MED ORDER — CETYLPYRIDINIUM CHLORIDE 0.05 % MT LIQD
7.0000 mL | Freq: Two times a day (BID) | OROMUCOSAL | Status: DC
  Administered 2015-08-15 – 2015-08-18 (×6): 7 mL via OROMUCOSAL

## 2015-08-15 MED ORDER — ENOXAPARIN SODIUM 40 MG/0.4ML ~~LOC~~ SOLN
40.0000 mg | SUBCUTANEOUS | Status: DC
  Administered 2015-08-15 – 2015-08-18 (×4): 40 mg via SUBCUTANEOUS
  Filled 2015-08-15 (×4): qty 0.4

## 2015-08-15 MED ORDER — SODIUM CHLORIDE 0.9 % IV SOLN
INTRAVENOUS | Status: DC
  Administered 2015-08-15 – 2015-08-17 (×5): via INTRAVENOUS

## 2015-08-15 MED ORDER — ALBUTEROL SULFATE (2.5 MG/3ML) 0.083% IN NEBU
2.5000 mg | INHALATION_SOLUTION | Freq: Four times a day (QID) | RESPIRATORY_TRACT | Status: DC
  Administered 2015-08-15 – 2015-08-16 (×7): 2.5 mg via RESPIRATORY_TRACT
  Filled 2015-08-15 (×7): qty 3

## 2015-08-15 MED ORDER — HYDROCODONE-HOMATROPINE 5-1.5 MG/5ML PO SYRP
5.0000 mL | ORAL_SOLUTION | ORAL | Status: DC | PRN
  Administered 2015-08-15 – 2015-08-16 (×5): 5 mL via ORAL
  Filled 2015-08-15 (×5): qty 5

## 2015-08-15 MED ORDER — ALBUTEROL SULFATE (2.5 MG/3ML) 0.083% IN NEBU
2.5000 mg | INHALATION_SOLUTION | RESPIRATORY_TRACT | Status: DC | PRN
  Administered 2015-08-15 – 2015-08-17 (×2): 2.5 mg via RESPIRATORY_TRACT
  Filled 2015-08-15 (×2): qty 3

## 2015-08-15 MED ORDER — ACETAMINOPHEN 325 MG PO TABS
650.0000 mg | ORAL_TABLET | Freq: Four times a day (QID) | ORAL | Status: DC | PRN
  Administered 2015-08-15: 650 mg via ORAL
  Filled 2015-08-15: qty 2

## 2015-08-15 MED ORDER — HYDROCODONE-ACETAMINOPHEN 5-325 MG PO TABS
1.0000 | ORAL_TABLET | Freq: Four times a day (QID) | ORAL | Status: DC | PRN
  Administered 2015-08-15 – 2015-08-18 (×10): 2 via ORAL
  Filled 2015-08-15 (×10): qty 2

## 2015-08-15 MED ORDER — DEXTROSE 5 % IV SOLN
1.0000 g | Freq: Once | INTRAVENOUS | Status: AC
  Administered 2015-08-15: 1 g via INTRAVENOUS
  Filled 2015-08-15: qty 10

## 2015-08-15 MED ORDER — LEVOTHYROXINE SODIUM 25 MCG PO TABS
25.0000 ug | ORAL_TABLET | Freq: Every day | ORAL | Status: DC
  Administered 2015-08-15 – 2015-08-18 (×4): 25 ug via ORAL
  Filled 2015-08-15 (×4): qty 1

## 2015-08-15 MED ORDER — SODIUM CHLORIDE 0.9 % IV BOLUS (SEPSIS)
1000.0000 mL | INTRAVENOUS | Status: AC
  Administered 2015-08-15 (×2): 1000 mL via INTRAVENOUS

## 2015-08-15 MED ORDER — ACETAMINOPHEN 650 MG RE SUPP: 650.0000 mg | Freq: Four times a day (QID) | RECTAL | Status: DC | PRN

## 2015-08-15 MED ORDER — DEXTROSE 5 % IV SOLN
500.0000 mg | Freq: Once | INTRAVENOUS | Status: AC
  Administered 2015-08-15: 500 mg via INTRAVENOUS
  Filled 2015-08-15: qty 500

## 2015-08-15 MED ORDER — ACETAMINOPHEN 500 MG PO TABS
1000.0000 mg | ORAL_TABLET | Freq: Once | ORAL | Status: AC
  Administered 2015-08-15: 1000 mg via ORAL
  Filled 2015-08-15: qty 2

## 2015-08-15 MED ORDER — METOPROLOL TARTRATE 25 MG PO TABS
12.5000 mg | ORAL_TABLET | Freq: Every day | ORAL | Status: DC
  Administered 2015-08-16 – 2015-08-18 (×3): 12.5 mg via ORAL
  Filled 2015-08-15 (×4): qty 1

## 2015-08-15 MED ORDER — BUDESONIDE 0.25 MG/2ML IN SUSP
0.2500 mg | Freq: Two times a day (BID) | RESPIRATORY_TRACT | Status: DC
  Administered 2015-08-15 – 2015-08-18 (×7): 0.25 mg via RESPIRATORY_TRACT
  Filled 2015-08-15 (×7): qty 2

## 2015-08-15 MED ORDER — ASPIRIN EC 81 MG PO TBEC
81.0000 mg | DELAYED_RELEASE_TABLET | Freq: Every day | ORAL | Status: DC
  Administered 2015-08-15 – 2015-08-18 (×4): 81 mg via ORAL
  Filled 2015-08-15 (×4): qty 1

## 2015-08-15 MED ORDER — DEXTROSE 5 % IV SOLN
1.0000 g | INTRAVENOUS | Status: DC
  Administered 2015-08-16 – 2015-08-18 (×3): 1 g via INTRAVENOUS
  Filled 2015-08-15 (×5): qty 10

## 2015-08-15 MED ORDER — LORAZEPAM 1 MG PO TABS
2.0000 mg | ORAL_TABLET | Freq: Four times a day (QID) | ORAL | Status: DC | PRN
  Administered 2015-08-15 – 2015-08-18 (×10): 2 mg via ORAL
  Filled 2015-08-15 (×10): qty 2

## 2015-08-15 MED ORDER — ONDANSETRON HCL 4 MG PO TABS: 4.0000 mg | ORAL_TABLET | Freq: Four times a day (QID) | ORAL | Status: DC | PRN

## 2015-08-15 MED ORDER — KETOROLAC TROMETHAMINE 30 MG/ML IJ SOLN
30.0000 mg | Freq: Once | INTRAMUSCULAR | Status: AC
  Administered 2015-08-15: 30 mg via INTRAVENOUS
  Filled 2015-08-15: qty 1

## 2015-08-15 MED ORDER — ONDANSETRON HCL 4 MG/2ML IJ SOLN: 4.0000 mg | Freq: Four times a day (QID) | INTRAMUSCULAR | Status: DC | PRN

## 2015-08-15 MED ORDER — DEXTROSE 5 % IV SOLN
500.0000 mg | INTRAVENOUS | Status: DC
  Administered 2015-08-16 – 2015-08-18 (×3): 500 mg via INTRAVENOUS
  Filled 2015-08-15 (×5): qty 500

## 2015-08-15 MED ORDER — GABAPENTIN 300 MG PO CAPS
300.0000 mg | ORAL_CAPSULE | Freq: Three times a day (TID) | ORAL | Status: DC
  Administered 2015-08-15 – 2015-08-18 (×10): 300 mg via ORAL
  Filled 2015-08-15 (×3): qty 1
  Filled 2015-08-15 (×2): qty 3
  Filled 2015-08-15 (×2): qty 1
  Filled 2015-08-15: qty 3
  Filled 2015-08-15 (×2): qty 1

## 2015-08-15 NOTE — Care Management Note (Signed)
Case Management Note  Patient Details  Name: Ashley Houston MRN: [id number] Date of Birth: 09/05/1940  Subjective/Objective:      Spoke with patient who is alert and oriented from home with Son and daughter. Stated that she can afford medications and are able to make their medical appointments without difficulty. Still drives. No DME or O2 at home.   May qualify for Home O2.  No other CM needs identified.              Action/Plan: Home with self care. May need O2   Expected Discharge Date:  08/17/15               Expected Discharge Plan:  Home/Self Care  In-House Referral:     Discharge planning Services  CM Consult  Post Acute Care Choice:    Choice offered to:     DME Arranged:    DME Agency:     HH Arranged:    HH Agency:     Status of Service:  In process, will continue to follow  Medicare Important Message Given:    Date Medicare IM Given:    Medicare IM give by:    Date Additional Medicare IM Given:    Additional Medicare Important Message give by:     If discussed at Long Length of Stay Meetings, dates discussed:    Additional Comments:  Adonis HugueninBerkhead, Mirielle Byrum L, RN 08/15/2015, 11:18 AM

## 2015-08-15 NOTE — Progress Notes (Signed)
Patient seen and examined. Admitted after midnight secondary to right side CP, SOB and fever. Found to have right side infiltrates on CXR and initiated on treatment for CAP. Patient also with elevated WBC's and presumed vascular congestion on CXR (possible CHF). Currently afebrile and denying CP. VS stable. Please referred to H&P written by Dr. Conley RollsLe for further info/details on admission.  Plan: -will check influenza -start pulmicort and flutter valve -given CP and presumed vascular congestion, will check 2-D echo -continue current antibiotics and follow culture data -continue supportive care  Vassie LollMadera, Yarlin Breisch [phone]

## 2015-08-15 NOTE — Progress Notes (Signed)
Pharmacy Antibiotic Note  Ashley Houston is a 75 y.o. female admitted on 08/15/2015 with pneumonia.  Pharmacy has been consulted for ROCEPHIN AND ZITHROMAX dosing.  Plan:  Rocephin 1gm IV q24hrs  Zithromax 500mg  IV q24hrs, switch to PO when improved  Monitor labs, progress and c/s  Height: 5\' 5"  (165.1 cm) Weight: 129 lb 14.4 oz (58.922 kg) IBW/kg (Calculated) : 57  Temp (24hrs), Avg:100.7 F (38.2 C), Min:98.6 F (37 C), Max:103.2 F (39.6 C)   Recent Labs Lab 08/15/15 0049 08/15/15 0050  WBC  --  15.2*  CREATININE  --  0.73  LATICACIDVEN 0.9  --     Estimated Creatinine Clearance: 56.4 mL/min (by C-G formula based on Cr of 0.73).    Allergies  Allergen Reactions  . Lipitor [Atorvastatin]    Anti-infectives    Start     Dose/Rate Route Frequency Ordered Stop   08/16/15 0000  cefTRIAXone (ROCEPHIN) 1 g in dextrose 5 % 50 mL IVPB     1 g 100 mL/hr over 30 Minutes Intravenous Every 24 hours 08/15/15 0402     08/16/15 0000  azithromycin (ZITHROMAX) 500 mg in dextrose 5 % 250 mL IVPB     500 mg 250 mL/hr over 60 Minutes Intravenous Every 24 hours 08/15/15 0402     08/15/15 0045  cefTRIAXone (ROCEPHIN) 1 g in dextrose 5 % 50 mL IVPB     1 g 100 mL/hr over 30 Minutes Intravenous  Once 08/15/15 0042 08/15/15 0133   08/15/15 0045  azithromycin (ZITHROMAX) 500 mg in dextrose 5 % 250 mL IVPB     500 mg 250 mL/hr over 60 Minutes Intravenous  Once 08/15/15 0042 08/15/15 0245     Results for orders placed or performed during the hospital encounter of 08/15/15  Blood Culture (routine x 2)     Status: None (Preliminary result)   Collection Time: 08/15/15 12:49 AM  Result Value Ref Range Status   Specimen Description BLOOD RIGHT ARM  Final   Special Requests BOTTLES DRAWN AEROBIC AND ANAEROBIC 4CC EACH  Final   Culture NO GROWTH < 12 HOURS  Final   Report Status PENDING  Incomplete  Blood Culture (routine x 2)     Status: None (Preliminary result)   Collection Time:  08/15/15 12:51 AM  Result Value Ref Range Status   Specimen Description BLOOD LEFT HAND  Final   Special Requests BOTTLES DRAWN AEROBIC AND ANAEROBIC 4CC EAH  Final   Culture NO GROWTH < 12 HOURS  Final   Report Status PENDING  Incomplete   Thank you for allowing pharmacy to be a part of this patient's care.  Valrie HartHall, Markitta Ausburn A 08/15/2015 11:21 AM

## 2015-08-15 NOTE — ED Provider Notes (Signed)
CSN: [id number]     Arrival date & time 08/15/15  0014 History  By signing my name below, I, Linna Darner, attest that this documentation has been prepared under the direction and in the presence of physician practitioner, Devoria Albe, MD at 0033 AM. Electronically Signed: Linna Darner, Scribe. 08/15/2015. 12:35 AM.    Chief Complaint  Patient presents with  . Chest Pain    right sided, ribs  . Fever    [name]. No language interpreter was used.     HPI Comments: Ashley Houston is a 75 y.o. female who presents to the Emergency Department complaining of sudden onset, constant, sharp, right chest/ribcage pain since approximately 10PM tonight when she laid down to go to sleep. Pt notes that she has been sick for the last 4-5 days; she endorses associated  nasal congestion, wheezing, intermittent productive cough with brown-gray mucus, weakness, and sore throat that is now gone. He nose has been stuffy. She denies chills and though she may have had a fever yesterday because she felt hot.  Pt states that coughing exacerbates her chest pain. She states that her sore throat has resolved. She notes that she occasionally uses inhalers but has been using nebulizers recently with significant relief of her breathing issues. She notes that her cough is not worsening. Pt states that she has been eating normally. Pt takes aspirin, gabapentin, ativan, lopressor, doxepin, and hydrocodone. She takes hydrocodone x4 daily for a ruptured disc in her back and bursitis in her right hip. She received a flu shot this year. She also received the pneumonia booster. She denies nausea, vomiting, diarrhea, chills, SOB, dizziness, lightheadedness, or any other associated symptoms.  PCP Darci Current, PA   Past Medical History  Diagnosis Date  . COPD (chronic obstructive pulmonary disease) (HCC)   . Tachycardia   . Thyroid disease   . Anxiety   . Chronic pain     hips back and shoulders  .  Depression    Past Surgical History  Procedure Laterality Date  . Abdominal hysterectomy    . Tonsillectomy    . Cholecystectomy     History reviewed. No pertinent family history. Social History  Substance Use Topics  . Smoking status: Current Every Day Smoker -- 0.50 packs/day    Types: Cigarettes  . Smokeless tobacco: Never Used  . Alcohol Use: No    She smokes slightly less than 1 ppd.  Pt lives with her oldest daughter and her daughter's husband.  OB History    No data available     Review of Systems  Constitutional: Positive for fever. Negative for chills.  HENT: Positive for congestion and sore throat.   Respiratory: Positive for cough and wheezing. Negative for shortness of breath.   Cardiovascular: Positive for chest pain.  Gastrointestinal: Negative for nausea, vomiting, abdominal pain and diarrhea.  Genitourinary: Negative for dysuria and frequency.  Neurological: Positive for weakness. Negative for dizziness and light-headedness.  All other systems reviewed and are negative.   Allergies  Lipitor  Home Medications  doxepin  Prior to Admission medications   Medication Sig Start Date End Date Taking? Authorizing Provider  aspirin 81 MG tablet Take 81 mg by mouth daily.   Yes Historical Provider, MD  gabapentin (NEURONTIN) 300 MG capsule Take 300 mg by mouth 3 (three) times daily.   Yes Historical Provider, MD  HYDROcodone-acetaminophen (NORCO) 10-325 MG tablet Take 1 tablet by mouth every 4 (four) hours as needed.  Yes Historical Provider, MD  levothyroxine (SYNTHROID, LEVOTHROID) 25 MCG tablet Take 25 mcg by mouth daily before breakfast.   Yes Historical Provider, MD  LORazepam (ATIVAN) 2 MG tablet Take 2 mg by mouth every 6 (six) hours as needed for anxiety.   Yes Historical Provider, MD  metoprolol tartrate (LOPRESSOR) 25 MG tablet Take 12.5 mg by mouth daily.   Yes Historical Provider, MD   BP 134/99 mmHg  Pulse 95  Temp(Src) 103.2 F (39.6 C) (Oral)   Resp 20  Ht 5\' 5"  (1.651 m)  Wt 123 lb (55.792 kg)  BMI 20.47 kg/m2  SpO2 88%  Vital signs normal except for fever  Physical Exam  Constitutional: She is oriented to person, place, and time. She appears well-developed and well-nourished.  Non-toxic appearance. She does not appear ill. No distress.  HENT:  Head: Normocephalic and atraumatic.  Right Ear: External ear normal.  Left Ear: External ear normal.  Nose: Nose normal. No mucosal edema or rhinorrhea.  Mouth/Throat: Oropharynx is clear and moist and mucous membranes are normal. No dental abscesses or uvula swelling.  Dry tongue  Eyes: Conjunctivae and EOM are normal. Pupils are equal, round, and reactive to light.  Neck: Normal range of motion and full passive range of motion without pain. Neck supple.  Cardiovascular: Normal rate, regular rhythm and normal heart sounds.  Exam reveals no gallop and no friction rub.   No murmur heard. Pulmonary/Chest: Effort normal and breath sounds normal. No respiratory distress. She has no wheezes. She has no rhonchi. She has no rales. She exhibits no tenderness and no crepitus.  Diminished breath sounds Rales in the bases that cleared when she coughed   Abdominal: Soft. Normal appearance and bowel sounds are normal. She exhibits no distension. There is no tenderness. There is no rebound and no guarding.  Musculoskeletal: Normal range of motion. She exhibits no edema or tenderness.  Moves all extremities well.   Neurological: She is alert and oriented to person, place, and time. She has normal strength. No cranial nerve deficit.  Tremulous  Skin: Skin is warm, dry and intact. No rash noted. No erythema. No pallor.  Psychiatric: She has a normal mood and affect. Her speech is normal and behavior is normal. Her mood appears not anxious.  Nursing note and vitals reviewed.   ED Course  Procedures (including critical care time)  Medications  acetaminophen (TYLENOL) tablet 1,000 mg (1,000 mg  Oral Given 08/15/15 0058)  sodium chloride 0.9 % bolus 1,000 mL (0 mLs Intravenous Stopped 08/15/15 0305)  cefTRIAXone (ROCEPHIN) 1 g in dextrose 5 % 50 mL IVPB (0 g Intravenous Stopped 08/15/15 0133)  azithromycin (ZITHROMAX) 500 mg in dextrose 5 % 250 mL IVPB (0 mg Intravenous Stopped 08/15/15 0245)  ketorolac (TORADOL) 30 MG/ML injection 30 mg (30 mg Intravenous Given 08/15/15 0232)     DIAGNOSTIC STUDIES: Oxygen Saturation is 88% on RA, low by my interpretation.    COORDINATION OF CARE: 12:44 AM Discussed treatment plan with pt at bedside and pt agreed to plan. Patient was given Tylenol for her fever. She was started on community-acquired pneumonia antibiotics due to her high fever and her symptoms of cough with productive sputum. She was given Toradol for her chest pain.  Recheck 01:45 pt is feeling better, still getting IV fluids and IV antibiotics. Discussed her xray and lab results. Will have nurses ambulate with pulse ox.   Patient's x-ray read as consistent with congestive heart failure however patient appeared to be  dehydrated on exam especially and especially en lieu of the fact she has a extremely high fever of 103.  Patient was ambulated by nursing staff and they report that she dropped her pulse ox to 83%, however she denied feeling short of breath. At this point I felt patient should be admitted to the hospital.  02:30 AM Dr Conley Rolls, will see patient and admit.        Labs Review Results for orders placed or performed during the hospital encounter of 08/15/15  Blood Culture (routine x 2)  Result Value Ref Range   Specimen Description BLOOD RIGHT ARM    Special Requests BOTTLES DRAWN AEROBIC AND ANAEROBIC 4CC EACH    Culture PENDING    Report Status PENDING   Blood Culture (routine x 2)  Result Value Ref Range   Specimen Description BLOOD LEFT HAND    Special Requests BOTTLES DRAWN AEROBIC AND ANAEROBIC 4CC EAH    Culture PENDING    Report Status PENDING   Comprehensive  metabolic panel  Result Value Ref Range   Sodium 136 135 - 145 mmol/L   Potassium 3.5 3.5 - 5.1 mmol/L   Chloride 100 (L) 101 - 111 mmol/L   CO2 28 22 - 32 mmol/L   Glucose, Bld 132 (H) 65 - 99 mg/dL   BUN 19 6 - 20 mg/dL   Creatinine, Ser 0.45 0.44 - 1.00 mg/dL   Calcium 8.5 (L) 8.9 - 10.3 mg/dL   Total Protein 7.4 6.5 - 8.1 g/dL   Albumin 3.5 3.5 - 5.0 g/dL   AST 17 15 - 41 U/L   ALT 12 (L) 14 - 54 U/L   Alkaline Phosphatase 87 38 - 126 U/L   Total Bilirubin 0.5 0.3 - 1.2 mg/dL   GFR calc non Af Amer >60 >60 mL/min   GFR calc Af Amer >60 >60 mL/min   Anion gap 8 5 - 15  CBC WITH DIFFERENTIAL  Result Value Ref Range   WBC 15.2 (H) 4.0 - 10.5 K/uL   RBC 4.05 3.87 - 5.11 MIL/uL   Hemoglobin 11.2 (L) 12.0 - 15.0 g/dL   HCT 40.9 (L) 81.1 - 91.4 %   MCV 85.4 78.0 - 100.0 fL   MCH 27.7 26.0 - 34.0 pg   MCHC 32.4 30.0 - 36.0 g/dL   RDW 78.2 (H) 95.6 - 21.3 %   Platelets 228 150 - 400 K/uL   Neutrophils Relative % 72 %   Neutro Abs 10.9 (H) 1.7 - 7.7 K/uL   Lymphocytes Relative 15 %   Lymphs Abs 2.3 0.7 - 4.0 K/uL   Monocytes Relative 11 %   Monocytes Absolute 1.7 (H) 0.1 - 1.0 K/uL   Eosinophils Relative 2 %   Eosinophils Absolute 0.2 0.0 - 0.7 K/uL   Basophils Relative 0 %   Basophils Absolute 0.1 0.0 - 0.1 K/uL  Urinalysis, Routine w reflex microscopic (not at [facility])  Result Value Ref Range   Color, Urine ORANGE (A) YELLOW   APPearance CLEAR CLEAR   Specific Gravity, Urine 1.020 1.005 - 1.030   pH 6.0 5.0 - 8.0   Glucose, UA NEGATIVE NEGATIVE mg/dL   Hgb urine dipstick NEGATIVE NEGATIVE   Bilirubin Urine NEGATIVE NEGATIVE   Ketones, ur NEGATIVE NEGATIVE mg/dL   Protein, ur NEGATIVE NEGATIVE mg/dL   Nitrite NEGATIVE NEGATIVE   Leukocytes, UA NEGATIVE NEGATIVE  Lactic acid, plasma  Result Value Ref Range   Lactic Acid, Venous 0.9 0.5 - 2.0 mmol/L  Laboratory interpretation all normal except leukocytosis   Imaging Review Dg Chest 2 View  08/15/2015  CLINICAL  DATA:  Sudden onset sharp right-sided chest pain, onset at 22:00. EXAM: CHEST  2 VIEW COMPARISON:  12/27/2012 FINDINGS: There is moderate vascular and interstitial prominence, new. This likely represents congestive heart failure. There is hyperinflation, with upper lobe emphysematous changes. There are small pleural effusions. There is no confluent airspace consolidation. There is moderate cardiomegaly. IMPRESSION: Probable CHF superimposed on COPD. Electronically Signed   By: Ellery Plunk M.D.   On: 08/15/2015 01:33   Dg Ribs Unilateral Right  08/15/2015  CLINICAL DATA:  Sudden onset sharp right-sided chest wall pain, onset at 22:00. EXAM: RIGHT RIBS - 2 VIEW COMPARISON:  None. FINDINGS: No fracture or other bone lesions are seen involving the ribs. IMPRESSION: Negative. Electronically Signed   By: Ellery Plunk M.D.   On: 08/15/2015 01:41   I have personally reviewed and evaluated these images and lab results as part of my medical decision-making.   EKG Interpretation   Date/Time:  Wednesday August 15 2015 00:20:08 EDT Ventricular Rate:  95 PR Interval:  168 QRS Duration: 83 QT Interval:  332 QTC Calculation: 417 R Axis:   50 Text Interpretation:  Sinus rhythm Abnormal R-wave progression, early  transition No old tracing to compare Confirmed by Alexei Ey  MD-I, Carlie Corpus ([id number])  on 08/15/2015 12:31:32 AM      MDM   Final diagnoses:  COPD exacerbation (HCC)  CAP (community acquired pneumonia)  Hypoxia    Plan admission   Devoria Albe, MD, FACEP  I personally performed the services described in this documentation, which was scribed in my presence. The recorded information has been reviewed and considered.  Devoria Albe, MD, Concha Pyo, MD 08/15/15 [phone]

## 2015-08-15 NOTE — H&P (Signed)
[facility] History and Physical  Ashley Houston ZOX:[id number] DOB: 12/13/1940    PCP:   Lenise HeraldMANN, BENJAMIN, PA-C   Chief Complaint: right sided chest pain and fever  HPI: Ashley Houston is an 75 y.o. female with a hx of COPD, thyroid disease, and tobacco abuse who presents to the ED with complaints of sudden onset, constant, sharp right sided chest pain that onset around 10pm tonight which is exacerbated by coughing. Pt reports having felt sick for about a week, with fever, nasal congestion, wheeze, intermittent productive cough with brown-gray sputum, weakness and a sore throat. She has inhalers at home that have provided relief of her breathing symptoms. She currently lives with her daughter and son-in-law. She admits to smoking 1ppd. She denies any N/V/D, chills, SOB, or any other associated symptoms.   While in the ED, workup showed her lactic acid was wnl, WBC elevated at 15.2. UA was unremarkable. CXR showed probable CHF superimposed on COPD. Hospitalist was asked to admit for management of CAP.  Rewiew of Systems:  Constitutional: Negative for malaise, fever and chills. No significant weight loss or weight gain Eyes: Negative for eye pain, redness and discharge, diplopia, visual changes, or flashes of light. ENMT: Negative for ear pain, hoarseness, nasal congestion, sinus pressure and sore throat. No headaches; tinnitus, drooling, or problem swallowing. Cardiovascular: Negative for chest pain, palpitations, diaphoresis, dyspnea and peripheral edema. ; No orthopnea, PND Respiratory: Negative for cough, hemoptysis, wheezing and stridor. No pleuritic chestpain. Gastrointestinal: Negative for nausea, vomiting, diarrhea, constipation, abdominal pain, melena, blood in stool, hematemesis, jaundice and rectal bleeding.    Genitourinary: Negative for frequency, dysuria, incontinence,flank pain and hematuria; Musculoskeletal: Negative for back pain and neck pain. Negative for swelling and  trauma.;  Skin: . Negative for pruritus, rash, abrasions, bruising and skin lesion.; ulcerations Neuro: Negative for headache, lightheadedness and neck stiffness. Negative for weakness, altered level of consciousness , altered mental status, extremity weakness, burning feet, involuntary movement, seizure and syncope.  Psych: negative for anxiety, depression, insomnia, tearfulness, panic attacks, hallucinations, paranoia, suicidal or homicidal ideation    Past Medical History  Diagnosis Date  . COPD (chronic obstructive pulmonary disease) (HCC)   . Tachycardia   . Thyroid disease   . Anxiety   . Chronic pain     hips back and shoulders  . Depression     Past Surgical History  Procedure Laterality Date  . Abdominal hysterectomy    . Tonsillectomy    . Cholecystectomy      Medications:  HOME MEDS: Prior to Admission medications   Medication Sig Start Date End Date Taking? Authorizing Provider  aspirin 81 MG tablet Take 81 mg by mouth daily.   Yes Historical Provider, MD  gabapentin (NEURONTIN) 300 MG capsule Take 300 mg by mouth 3 (three) times daily.   Yes Historical Provider, MD  HYDROcodone-acetaminophen (NORCO) 10-325 MG tablet Take 1 tablet by mouth every 4 (four) hours as needed.   Yes Historical Provider, MD  levothyroxine (SYNTHROID, LEVOTHROID) 25 MCG tablet Take 25 mcg by mouth daily before breakfast.   Yes Historical Provider, MD  LORazepam (ATIVAN) 2 MG tablet Take 2 mg by mouth every 6 (six) hours as needed for anxiety.   Yes Historical Provider, MD  metoprolol tartrate (LOPRESSOR) 25 MG tablet Take 12.5 mg by mouth daily.   Yes Historical Provider, MD     Allergies:  Allergies  Allergen Reactions  . Lipitor [Atorvastatin]     Social History:   reports that she  has been smoking Cigarettes.  She has been smoking about 0.50 packs per day. She has never used smokeless tobacco. She reports that she does not drink alcohol or use illicit drugs.  Family History: No  family history on file.   Physical Exam: Filed Vitals:   08/15/15 0017 08/15/15 0100 08/15/15 0151 08/15/15 0200  BP: 134/99 120/82  103/56  Pulse: 95 95  89  Temp: 103.2 F (39.6 C)  102.3 F (39.1 C)   TempSrc: Oral  Oral   Resp: Height:  (1.651 m)     Weight: 55.792 kg (123 lb)     SpO2: 88% 94%  95%   Blood pressure 103/56, pulse 89, temperature 102.3 F (39.1 C), temperature source Oral, resp. rate 20, height  (1.651 m), weight 55.792 kg (123 lb), SpO2 95 %.  GEN:  Pleasant, patient lying in the stretcher in no acute distress; cooperative with exam. PSYCH:  alert and oriented x4; does not appear anxious or depressed; affect is appropriate. HEENT: Mucous membranes pink and anicteric; PERRLA; EOM intact; no cervical lymphadenopathy nor thyromegaly or carotid bruit; no JVD; There were no stridor. Neck is very supple. Breasts:: Not examined CHEST WALL: No tenderness CHEST: Normal respiration, decreased BS throughout, with mild wheezing. No rales.  HEART: Regular rate and rhythm.  There are no murmur, rub, or gallops.   BACK: No kyphosis or scoliosis; no CVA tenderness ABDOMEN: soft and non-tender; no masses, no organomegaly, normal abdominal bowel sounds; no pannus; no intertriginous candida. There is no rebound and no distention. Rectal Exam: Not done EXTREMITIES: No bone or joint deformity; age-appropriate arthropathy of the hands and knees; no edema; no ulcerations.  There is no calf tenderness. Genitalia: not examined PULSES: 2+ and symmetric SKIN: Normal hydration no rash or ulceration CNS: Cranial nerves 2-12 grossly intact no focal lateralizing neurologic deficit.  Speech is fluent; uvula elevated with phonation, facial symmetry and tongue midline. DTR are normal bilaterally, cerebella exam is intact, barbinski is negative and strengths are equaled bilaterally.  No sensory loss.   Labs on Admission:  Basic Metabolic Panel:  Recent Labs Lab  08/15/15 0050  NA 136  K 3.5  CL 100*  CO2 28  GLUCOSE 132*  BUN 19  CREATININE 0.73  CALCIUM 8.5*   Liver Function Tests:  Recent Labs Lab 08/15/15 0050  AST 17  ALT 12*  ALKPHOS 87  BILITOT 0.5  PROT 7.4  ALBUMIN 3.5   CBC:  Recent Labs Lab 08/15/15 0050  WBC 15.2*  NEUTROABS 10.9*  HGB 11.2*  HCT 34.6*  MCV 85.4  PLT 228    Radiological Exams on Admission: Dg Chest 2 View  08/15/2015  CLINICAL DATA:  Sudden onset sharp right-sided chest pain, onset at 22:00. EXAM: CHEST  2 VIEW COMPARISON:  12/27/2012 FINDINGS: There is moderate vascular and interstitial prominence, new. This likely represents congestive heart failure. There is hyperinflation, with upper lobe emphysematous changes. There are small pleural effusions. There is no confluent airspace consolidation. There is moderate cardiomegaly. IMPRESSION: Probable CHF superimposed on COPD. Electronically Signed   By: Ellery Plunk M.D.   On: 08/15/2015 01:33   Dg Ribs Unilateral Right  08/15/2015  CLINICAL DATA:  Sudden onset sharp right-sided chest wall pain, onset at 22:00. EXAM: RIGHT RIBS - 2 VIEW COMPARISON:  None. FINDINGS: No fracture or other bone lesions are seen involving the ribs. IMPRESSION: Negative. Electronically Signed   By: Rosey Bath.D.  On: 08/15/2015 01:41    EKG: Independently reviewed.    Assessment/Plan Present on Admission:  . CAP (community acquired pneumonia) . COPD (chronic obstructive pulmonary disease) (HCC) . Thyroid disease . Chest pain . Tobacco abuse  1. CAP. I suspect she has CAP with fever, productive sputum, hypoxia, and SOB.  CXR showed probably CHF superimposed on COPD, but I don't think she has CHF.  X-ray ribs was unremarkable. O2 level had dropped to 80's while ambulating on RA. Will give abx, IVFs, and O2. Start on pulmonary hygiene and give cough suppressant. 2. COPD. Stable.  She doesn't need IV Steroids.   3. Atypical chest pain: This is clearly from  coughing, as it was on her flank rather than retrosternal CP.  4. Hypothyroidism. Continue on synthroid. Check TSH.  5. Tobacco abuse. Counseled on the importance of cessation.  Other plans as per orders. Code Status: Full  Houston Siren, MD.FACP.  [facility] Pager [phone] 7pm to 7am.  08/15/2015, 2:29 AM  By signing my name below, I, Adron Bene, attest that this documentation has been prepared under the direction and in the presence of Houston Siren, MD. Electronically Signed: Adron Bene, Scribe 08/15/2015 2:30am

## 2015-08-15 NOTE — ED Notes (Signed)
Pt states she started having pain in her right ribs that wrapped around, pt states she has been fighting a cold that isn't getting better

## 2015-08-15 NOTE — ED Notes (Signed)
Patient ambulated to the nurses station with O2 94% on room air. Patient walked back to the room with  O2 83% on room air Denies any short of breath and dizziness. Patient is complaining of her ribs is hurting. Patient ambulated with minimal assistance.

## 2015-08-16 ENCOUNTER — Inpatient Hospital Stay (HOSPITAL_COMMUNITY): Payer: Commercial Managed Care - HMO

## 2015-08-16 DIAGNOSIS — R079 Chest pain, unspecified: Secondary | ICD-10-CM

## 2015-08-16 DIAGNOSIS — R0600 Dyspnea, unspecified: Secondary | ICD-10-CM

## 2015-08-16 DIAGNOSIS — E039 Hypothyroidism, unspecified: Secondary | ICD-10-CM

## 2015-08-16 DIAGNOSIS — K219 Gastro-esophageal reflux disease without esophagitis: Secondary | ICD-10-CM

## 2015-08-16 LAB — ECHOCARDIOGRAM COMPLETE
Height: 65 in
Weight: 2078.4 oz

## 2015-08-16 LAB — URINE CULTURE: CULTURE: NO GROWTH

## 2015-08-16 LAB — BASIC METABOLIC PANEL
ANION GAP: 8 (ref 5–15)
BUN: 10 mg/dL (ref 6–20)
CHLORIDE: 107 mmol/L (ref 101–111)
CO2: 26 mmol/L (ref 22–32)
Calcium: 8.1 mg/dL — ABNORMAL LOW (ref 8.9–10.3)
Creatinine, Ser: 0.45 mg/dL (ref 0.44–1.00)
GFR calc Af Amer: >60 mL/min (ref >60)
GFR calc non Af Amer: >60 mL/min (ref >60)
Glucose, Bld: 95 mg/dL (ref 65–99)
POTASSIUM: 3.3 mmol/L — AB (ref 3.5–5.1)
Sodium: 141 mmol/L (ref 135–145)

## 2015-08-16 LAB — CBC
HCT: 29.5 % — ABNORMAL LOW (ref 36.0–46.0)
HEMOGLOBIN: 9.6 g/dL — AB (ref 12.0–15.0)
MCH: 27.6 pg (ref 26.0–34.0)
MCHC: 32.5 g/dL (ref 30.0–36.0)
MCV: 84.8 fL (ref 78.0–100.0)
Platelets: 228 10*3/uL (ref 150–400)
RBC: 3.48 MIL/uL — AB (ref 3.87–5.11)
RDW: 17.2 % — ABNORMAL HIGH (ref 11.5–15.5)
WBC: 10.4 10*3/uL (ref 4.0–10.5)

## 2015-08-16 LAB — LEGIONELLA PNEUMOPHILA SEROGP 1 UR AG: L. PNEUMOPHILA SEROGP 1 UR AG: NEGATIVE

## 2015-08-16 MED ORDER — POTASSIUM CHLORIDE CRYS ER 20 MEQ PO TBCR
40.0000 meq | EXTENDED_RELEASE_TABLET | ORAL | Status: AC
  Administered 2015-08-16 (×2): 40 meq via ORAL
  Filled 2015-08-16 (×2): qty 2

## 2015-08-16 MED ORDER — PREDNISONE 20 MG PO TABS
50.0000 mg | ORAL_TABLET | Freq: Every day | ORAL | Status: DC
  Administered 2015-08-16 – 2015-08-18 (×3): 50 mg via ORAL
  Filled 2015-08-16 (×3): qty 2

## 2015-08-16 MED ORDER — PANTOPRAZOLE SODIUM 40 MG PO TBEC
40.0000 mg | DELAYED_RELEASE_TABLET | Freq: Every day | ORAL | Status: DC
  Administered 2015-08-16 – 2015-08-18 (×3): 40 mg via ORAL
  Filled 2015-08-16 (×3): qty 1

## 2015-08-16 MED ORDER — ALBUTEROL SULFATE (2.5 MG/3ML) 0.083% IN NEBU
2.5000 mg | INHALATION_SOLUTION | Freq: Three times a day (TID) | RESPIRATORY_TRACT | Status: DC
  Administered 2015-08-17 – 2015-08-18 (×5): 2.5 mg via RESPIRATORY_TRACT
  Filled 2015-08-16 (×5): qty 3

## 2015-08-16 NOTE — Consult Note (Signed)
   [facility]   08/16/2015  Jule Economyhyllis Shiner 02/10/1941 [id number]   Sp[facility] with patient at bedside [facility]. [facility]. [facility] given [facility] and contact information for future reference.  Of note, [facility] services would not replace or interfere with any services that are arranged by inpatient case management or social work. For additional questions or referrals please contact:  Alben SpittleMary E. Albertha GheeNiemczura, RN, BSN, [facility]  [facility] Hospital Liaison [phone]

## 2015-08-16 NOTE — Plan of Care (Signed)
Problem: Safety: Goal: Ability to remain free from injury will improve Outcome: Progressing Pt requires assistance when out of bed. Bed alarm on.   Problem: Physical Regulation: Goal: Ability to maintain clinical measurements within normal limits will improve Outcome: Progressing Pt had a temperature of 101.2 but resolved after administration of acetaminophen and antibiotics. Will continue to monitor pt.

## 2015-08-16 NOTE — Progress Notes (Signed)
[facility] PROGRESS NOTE  Ashley Houston AVW:[id number] DOB: [birth year] DOA: 08/15/2015 PCP: [name], PA-C  Assessment/Plan: 1-shortness of breath and right-sided chest pain: Appears to be secondary to COPD exacerbation and community-acquired pneumonia. -Patient was still spiking fever last night; but overall significantly improve -Will continue current antibiotic therapy, prednisone, nebulizer treatments -Follow clinical response and wean oxygen supplementation as tolerated -Patient endorses improvement in her overall chest discomfort -Continue cough suppressants  2-chronic diastolic heart failure: -Compensated -Preserved ejection fraction on 2-D echo -Continue metoprolol (low dose) -Advised to follow low sodium diet and to check her weights on daily basis -No need for diuretics currently.  3-essential hypertension: Continue metoprolol -Patient advised to follow a heart healthy diet  4-hypothyroidism: -Continue Synthroid  5-GERD: -continue PPI  6-hypokalemia: -Mile and most likely secondary to ongoing use of nebulizer therapy -Will replete as needed Code Status: Full code Family Communication: no family at bedside  Disposition Plan: Remains inpatient, start weaning oxygen supplementation as tolerated; continue IV antibiotics and the use of nebulize steroids. Hopefully home in the next 1-2 days.   Consultants:  None  Procedures:  See below for x-ray reports  2-D echo - Left ventricle: The cavity size was normal. There was mild focal  basal hypertrophy of the septum. Systolic function was normal.  The estimated ejection fraction was in the range of 55% to 60%.  Wall motion was normal; there were no regional wall motion  abnormalities. Features are consistent with a pseudonormal left  ventricular filling pattern, with concomitant abnormal relaxation  and increased filling pressure (grade 2 diastolic dysfunction). - Aortic valve: Possibly bicuspid;  moderately thickened, moderately  calcified leaflets. There was severe stenosis. Mean gradient (S):  42 mm Hg. Peak gradient (S): 71 mm Hg. VTI ratio of LVOT to  aortic valve: 0.27. Valve area (VTI): 1.03 cm^2. Valve area  (Vmax): 0.89 cm^2. - Aortic root: The aortic root was mildly dilated. - Mitral valve: Calcified annulus. There was trivial regurgitation. - Right atrium: Central venous pressure (est): 8 mm Hg. - Tricuspid valve: There was trivial regurgitation. - Pulmonary arteries: Systolic pressure could not be accurately  estimated. - Pericardium, extracardiac: A prominent pericardial fat pad was  present.  Impressions: - Mild basal septal LV hypertrophy with LVEF 55-60%. Grade 2  diastolic dysfunction with increased LV filling pressure. MAC  with trivial mitral regurgitation. Possibly bicuspid aortic  valve, moderately thickened and calcified with evidence of severe  aortic stenosis as outlined above. There is no old study for  comparison. Aortic root is mildly dilated. Trivial tricuspid  regurgitation.  Antibiotics:  Rocephin and Zithromax 4/517  HPI/Subjective: Afebrile, denies chest pain, no nausea, no vomiting. Patient with improvement in her breathing.  Objective: Filed Vitals:   08/16/15 0914 08/16/15 1500  BP: 114/43 108/51  Pulse: 85 74  Temp:  98.4 F (36.9 C)  Resp:  16    Intake/Output Summary (Last 24 hours) at 08/16/15 1658 Last data filed at 08/16/15 1500  Gross per 24 hour  Intake 3723.33 ml  Output      0 ml  Net 3723.33 ml   Filed Weights   08/15/15 0017 08/15/15 0351  Weight: 55.792 kg (123 lb) 58.922 kg (129 lb 14.4 oz)    Exam:   General:  Afebrile, denies chest pain and reports breathing is improving. Still with some intermittent episode of coughing spells and mild difficulty speaking in full sentences.  Cardiovascular: S1 and S2, no rubs, no gallops; no JVD on exam  Respiratory: Positive rhonchi, mild expiratory  wheezing, no crackles  Abdomen: Soft, nontender, nondistended, positive bowel sounds  Musculoskeletal: No edema, no cyanosis  Data Reviewed: Basic Metabolic Panel:  Recent Labs Lab 08/15/15 0050 08/16/15 0534  NA 136 141  K 3.5 3.3*  CL 100* 107  CO2 28 26  GLUCOSE 132* 95  BUN 19 10  CREATININE 0.73 0.45  CALCIUM 8.5* 8.1*   Liver Function Tests:  Recent Labs Lab 08/15/15 0050  AST 17  ALT 12*  ALKPHOS 87  BILITOT 0.5  PROT 7.4  ALBUMIN 3.5   CBC:  Recent Labs Lab 08/15/15 0050 08/16/15 0534  WBC 15.2* 10.4  NEUTROABS 10.9*  --   HGB 11.2* 9.6*  HCT 34.6* 29.5*  MCV 85.4 84.8  PLT 228 228   CBG: No results for input(s): GLUCAP in the last 168 hours.  Recent Results (from the past 240 hour(s))  Blood Culture (routine x 2)     Status: None (Preliminary result)   Collection Time: 08/15/15 12:49 AM  Result Value Ref Range Status   Specimen Description BLOOD RIGHT ARM DRAWN BY RN  Final   Special Requests BOTTLES DRAWN AEROBIC AND ANAEROBIC 4CC EACH  Final   Culture NO GROWTH 1 DAY  Final   Report Status PENDING  Incomplete  Blood Culture (routine x 2)     Status: None (Preliminary result)   Collection Time: 08/15/15 12:51 AM  Result Value Ref Range Status   Specimen Description BLOOD LEFT HAND DRAWN BY RN  Final   Special Requests BOTTLES DRAWN AEROBIC AND ANAEROBIC 4CC EACH  Final   Culture NO GROWTH 1 DAY  Final   Report Status PENDING  Incomplete  Urine culture     Status: None   Collection Time: 08/15/15  1:30 AM  Result Value Ref Range Status   Specimen Description URINE, CLEAN CATCH  Final   Special Requests NONE  Final   Culture   Final    NO GROWTH 1 DAY [facility]    Report Status 08/16/2015 FINAL  Final     Studies: Dg Chest 2 View  08/15/2015  CLINICAL DATA:  Sudden onset sharp right-sided chest pain, onset at 22:00. EXAM: CHEST  2 VIEW COMPARISON:  12/27/2012 FINDINGS: There is moderate vascular and  interstitial prominence, new. This likely represents congestive heart failure. There is hyperinflation, with upper lobe emphysematous changes. There are small pleural effusions. There is no confluent airspace consolidation. There is moderate cardiomegaly. IMPRESSION: Probable CHF superimposed on COPD. Electronically Signed   By: Ellery Plunkaniel R Mitchell M.D.   On: 08/15/2015 01:33   Dg Ribs Unilateral Right  08/15/2015  CLINICAL DATA:  Sudden onset sharp right-sided chest wall pain, onset at 22:00. EXAM: RIGHT RIBS - 2 VIEW COMPARISON:  None. FINDINGS: No fracture or other bone lesions are seen involving the ribs. IMPRESSION: Negative. Electronically Signed   By: Ellery Plunkaniel R Mitchell M.D.   On: 08/15/2015 01:41    Scheduled Meds: . albuterol  2.5 mg Nebulization Q6H  . antiseptic oral rinse  7 mL Mouth Rinse BID  . aspirin EC  81 mg Oral Daily  . azithromycin  500 mg Intravenous Q24H  . budesonide (PULMICORT) nebulizer solution  0.25 mg Nebulization BID  . cefTRIAXone (ROCEPHIN)  IV  1 g Intravenous Q24H  . enoxaparin (LOVENOX) injection  40 mg Subcutaneous Q24H  . gabapentin  300 mg Oral TID  . levothyroxine  25 mcg Oral QAC breakfast  . metoprolol  tartrate  12.5 mg Oral Daily   Continuous Infusions: . sodium chloride 100 mL/hr at 08/16/15 6387    Principal Problem:   CAP (community acquired pneumonia) Active Problems:   COPD (chronic obstructive pulmonary disease) (HCC)   Thyroid disease   Chest pain   Tobacco abuse    Time spent: 35 minutes    Vassie Loll  [facility] Pager [phone]. If 7PM-7AM, please contact night-coverage at [email or web address], password [facility] 08/16/2015, 4:58 PM  LOS: 1 day

## 2015-08-16 NOTE — Clinical Documentation Improvement (Signed)
Internal Medicine  Please clarify if CHF ruled in or out and if it did, please clarify type and acuity. Please document findings in next progress note.   . Acuity - Acute, Chronic, Acute on Chronic   Type - Systolic, Diastolic, Systolic and Diastolic  Other  Clinically Undetermined  Document any associated diagnoses/conditions  Supporting Information:  Patient currently being treated with ASA and PO Lopressor daily  Please exercise your independent, professional judgment when responding. A specific answer is not anticipated or expected.  Thank You,  Shellee MiloEileen T Marely Apgar RN, BSN, CCDS Health Information Management [place] [phone]; Cell: [phone]

## 2015-08-17 DIAGNOSIS — I5032 Chronic diastolic (congestive) heart failure: Secondary | ICD-10-CM

## 2015-08-17 DIAGNOSIS — J441 Chronic obstructive pulmonary disease with (acute) exacerbation: Secondary | ICD-10-CM

## 2015-08-17 LAB — BASIC METABOLIC PANEL
ANION GAP: 8 (ref 5–15)
BUN: 9 mg/dL (ref 6–20)
CALCIUM: 8.8 mg/dL — AB (ref 8.9–10.3)
CO2: 28 mmol/L (ref 22–32)
CREATININE: 0.48 mg/dL (ref 0.44–1.00)
Chloride: 108 mmol/L (ref 101–111)
GFR calc Af Amer: >60 mL/min (ref >60)
GLUCOSE: 128 mg/dL — AB (ref 65–99)
Potassium: 4.4 mmol/L (ref 3.5–5.1)
Sodium: 144 mmol/L (ref 135–145)

## 2015-08-17 NOTE — Care Management (Signed)
Referral placed with [facility] for Home O2. In anticipation of future discharge.

## 2015-08-17 NOTE — Progress Notes (Signed)
Patient's room air saturation at rest was 83 percent. Patient was placed on 4 liters nasal canula, while ambulating oxygen saturation was 86 percent. Oxygen was increased to 6 liters while ambulating,patient's saturation was 87 percent. I increased patient's oxygen to 8 liters via nasal canula, while ambulating oxygen saturation was 92 percent.Dr Gwenlyn PerkingMadera notified. Will continue to monitor patient.

## 2015-08-17 NOTE — Progress Notes (Addendum)
[facility] PROGRESS NOTE  Ashley Houston ZOX:[id number] DOB: February 16, 1941 DOA: 08/15/2015 PCP: [name], PA-C  Assessment/Plan: 1-shortness of breath and right-sided chest pain: Appears to be secondary to COPD exacerbation and community-acquired pneumonia. -Patient is now afebrile and WBC's WNL -still requiring O2 supplementation and desaturating badly even on oxygen while on exertion. -Will continue current antibiotic therapy, prednisone, nebulizer treatments -Follow clinical response and wean oxygen supplementation as tolerated (anticipate will need O2 at discharge) -no further CP -Continue cough suppressants -continue pulmicort and flutter valve  2-chronic diastolic heart failure: -Compensated -Preserved ejection fraction on 2-D echo -Continue metoprolol (low dose) -Advised to follow low sodium diet and to check her weights on daily basis -No need for diuretics currently.  3-essential hypertension: Continue metoprolol -Patient advised to follow a heart healthy diet  4-hypothyroidism: -Continue Synthroid  5-GERD: -continue PPI  6-hypokalemia: -Mild and most likely secondary to ongoing use of nebulizer therapy -Will replete as needed  7. Tobacco abuse -cessation counseling provided   Code Status: Full code Family Communication: no family at bedside  Disposition Plan: Remains inpatient, start weaning oxygen supplementation as tolerated; continue IV antibiotics and the use of nebulize steroids. Hopefully home in the next 1-2 days.   Consultants:  None  Procedures:  See below for x-ray reports  2-D echo - Left ventricle: The cavity size was normal. There was mild focal  basal hypertrophy of the septum. Systolic function was normal.  The estimated ejection fraction was in the range of 55% to 60%.  Wall motion was normal; there were no regional wall motion  abnormalities. Features are consistent with a pseudonormal left  ventricular filling pattern,  with concomitant abnormal relaxation  and increased filling pressure (grade 2 diastolic dysfunction). - Aortic valve: Possibly bicuspid; moderately thickened, moderately  calcified leaflets. There was severe stenosis. Mean gradient (S):  42 mm Hg. Peak gradient (S): 71 mm Hg. VTI ratio of LVOT to  aortic valve: 0.27. Valve area (VTI): 1.03 cm^2. Valve area  (Vmax): 0.89 cm^2. - Aortic root: The aortic root was mildly dilated. - Mitral valve: Calcified annulus. There was trivial regurgitation. - Right atrium: Central venous pressure (est): 8 mm Hg. - Tricuspid valve: There was trivial regurgitation. - Pulmonary arteries: Systolic pressure could not be accurately  estimated. - Pericardium, extracardiac: A prominent pericardial fat pad was  present.  Impressions: - Mild basal septal LV hypertrophy with LVEF 55-60%. Grade 2  diastolic dysfunction with increased LV filling pressure. MAC  with trivial mitral regurgitation. Possibly bicuspid aortic  valve, moderately thickened and calcified with evidence of severe  aortic stenosis as outlined above. There is no old study for  comparison. Aortic root is mildly dilated. Trivial tricuspid  regurgitation.  Antibiotics:  Rocephin and Zithromax 4/517  HPI/Subjective: Afebrile, denies chest pain, no nausea, no vomiting. Patient with some improvement in her breathing. Still requiring O2 supplementation and while attempting ambulation experienced bad desaturation.   Objective: Filed Vitals:   08/17/15 1100 08/17/15 1320  BP: 122/56 137/64  Pulse: 86 84  Temp:  97.7 F (36.5 C)  Resp:  20    Intake/Output Summary (Last 24 hours) at 08/17/15 1911 Last data filed at 08/17/15 1821  Gross per 24 hour  Intake 1948.75 ml  Output    200 ml  Net 1748.75 ml   Filed Weights   08/15/15 0017 08/15/15 0351  Weight: 55.792 kg (123 lb) 58.922 kg (129 lb 14.4 oz)    Exam:   General:  Afebrile, denies chest  pain and reports  breathing continue to improved slowly. Patient still requiring oxygen supplementation and is experiencing bad desaturation on exertion.   Cardiovascular: S1 and S2, no rubs, no gallops; no JVD on exam  Respiratory: Positive rhonchi, mild expiratory wheezing, no crackles; moving more air  Abdomen: Soft, nontender, nondistended, positive bowel sounds  Musculoskeletal: No edema, no cyanosis  Data Reviewed: Basic Metabolic Panel:  Recent Labs Lab 08/15/15 0050 08/16/15 0534 08/17/15 0514  NA 136 141 144  K 3.5 3.3* 4.4  CL 100* 107 108  CO2 28 26 28   GLUCOSE 132* 95 128*  BUN 19 10 9   CREATININE 0.73 0.45 0.48  CALCIUM 8.5* 8.1* 8.8*   Liver Function Tests:  Recent Labs Lab 08/15/15 0050  AST 17  ALT 12*  ALKPHOS 87  BILITOT 0.5  PROT 7.4  ALBUMIN 3.5   CBC:  Recent Labs Lab 08/15/15 0050 08/16/15 0534  WBC 15.2* 10.4  NEUTROABS 10.9*  --   HGB 11.2* 9.6*  HCT 34.6* 29.5*  MCV 85.4 84.8  PLT 228 228    Recent Results (from the past 240 hour(s))  Blood Culture (routine x 2)     Status: None (Preliminary result)   Collection Time: 08/15/15 12:49 AM  Result Value Ref Range Status   Specimen Description BLOOD RIGHT ARM DRAWN BY RN  Final   Special Requests BOTTLES DRAWN AEROBIC AND ANAEROBIC 4CC EACH  Final   Culture NO GROWTH 2 DAYS  Final   Report Status PENDING  Incomplete  Blood Culture (routine x 2)     Status: None (Preliminary result)   Collection Time: 08/15/15 12:51 AM  Result Value Ref Range Status   Specimen Description BLOOD LEFT HAND DRAWN BY RN  Final   Special Requests BOTTLES DRAWN AEROBIC AND ANAEROBIC 4CC EACH  Final   Culture NO GROWTH 2 DAYS  Final   Report Status PENDING  Incomplete  Urine culture     Status: None   Collection Time: 08/15/15  1:30 AM  Result Value Ref Range Status   Specimen Description URINE, CLEAN CATCH  Final   Special Requests NONE  Final   Culture   Final    NO GROWTH 1 DAY [facility]     Report Status 08/16/2015 FINAL  Final     Studies: No results found.  Scheduled Meds: . albuterol  2.5 mg Nebulization TID  . antiseptic oral rinse  7 mL Mouth Rinse BID  . aspirin EC  81 mg Oral Daily  . azithromycin  500 mg Intravenous Q24H  . budesonide (PULMICORT) nebulizer solution  0.25 mg Nebulization BID  . cefTRIAXone (ROCEPHIN)  IV  1 g Intravenous Q24H  . enoxaparin (LOVENOX) injection  40 mg Subcutaneous Q24H  . gabapentin  300 mg Oral TID  . levothyroxine  25 mcg Oral QAC breakfast  . metoprolol tartrate  12.5 mg Oral Daily  . pantoprazole  40 mg Oral Daily  . predniSONE  50 mg Oral Q breakfast   Continuous Infusions: . sodium chloride 75 mL/hr at 08/17/15 0919    Principal Problem:   CAP (community acquired pneumonia) Active Problems:   COPD (chronic obstructive pulmonary disease) (HCC)   Thyroid disease   Chest pain   Tobacco abuse    Time spent: 35 minutes    Vassie LollMadera, Chyrel Taha  [facility] Pager [phone]. If 7PM-7AM, please contact night-coverage at [email or web address], password [email or web address] 08/17/2015, 7:11 PM  LOS: 2 days

## 2015-08-17 NOTE — Care Management Important Message (Signed)
Important Message  Patient Details  Name: Ashley Houston MRN: [id number] Date of Birth: 04/20/1941   Medicare Important Message Given:  Yes    Adonis HugueninBerkhead, [name], RN 08/17/2015, 8:42 AM

## 2015-08-18 DIAGNOSIS — J189 Pneumonia, unspecified organism: Secondary | ICD-10-CM

## 2015-08-18 DIAGNOSIS — J438 Other emphysema: Secondary | ICD-10-CM

## 2015-08-18 MED ORDER — LEVOFLOXACIN 500 MG PO TABS: 500.0000 mg | ORAL_TABLET | Freq: Every day | ORAL | Status: DC

## 2015-08-18 MED ORDER — PREDNISONE 10 MG PO TABS: 10.0000 mg | ORAL_TABLET | Freq: Every day | ORAL | Status: DC

## 2015-08-18 NOTE — Progress Notes (Signed)
[facility]able oxygen delivered by [facility] and patient now waiting for transportation to return.  Oxygen set on 2.5 L for  home use.

## 2015-08-18 NOTE — Discharge Summary (Signed)
Physician Discharge Summary  Ashley Houston ZOX:[id number] DOB: 04/25/1941 DOA: 08/15/2015  PCP: [name], PA-C  Admit date: 08/15/2015 Discharge date: 08/18/2015  Time spent: 45 minutes  Recommendations for Outpatient Follow-up:  -Will be discharged home today. -Advised to follow-up with primary care provider in 2 weeks.   Discharge Diagnoses:  Principal Problem:   CAP (community acquired pneumonia) Active Problems:   COPD (chronic obstructive pulmonary disease) (HCC)   Thyroid disease   Chest pain   Tobacco abuse Chronic Diastolic CHF  Discharge Condition: Stable and improved  Filed Weights   08/15/15 0017 08/15/15 0351  Weight: 55.792 kg (123 lb) 58.922 kg (129 lb 14.4 oz)    History of present illness:  As per Dr. Conley Rolls on 4/4: Ashley Houston is an 75 y.o. female with a hx of COPD, thyroid disease, and tobacco abuse who presents to the ED with complaints of sudden onset, constant, sharp right sided chest pain that onset around 10pm tonight which is exacerbated by coughing. Pt reports having felt sick for about a week, with fever, nasal congestion, wheeze, intermittent productive cough with brown-gray sputum, weakness and a sore throat. She has inhalers at home that have provided relief of her breathing symptoms. She currently lives with her daughter and son-in-law. She admits to smoking 1ppd. She denies any N/V/D, chills, SOB, or any other associated symptoms.   While in the ED, workup showed her lactic acid was wnl, WBC elevated at 15.2. UA was unremarkable. CXR showed probable CHF superimposed on COPD. Hospitalist was asked to admit for management of CAP.  Hospital Course:   1-shortness of breath and right-sided chest pain: Appears to be secondary to COPD exacerbation and community-acquired pneumonia. -Patient is now afebrile and WBC's WNL -still requiring O2 supplementation and desaturating badly even on oxygen while on exertion. -We'll need to be discharged on  home oxygen. -Will discharge on 5 days of Levaquin, prednisone taper.  2-chronic diastolic heart failure: -Compensated -Preserved ejection fraction on 2-D echo -Continue metoprolol (low dose) -Advised to follow low sodium diet and to check her weights on daily basis -No need for diuretics currently.  3-essential hypertension: Continue metoprolol -Patient advised to follow a heart healthy diet  4-hypothyroidism: -Continue Synthroid  5-GERD: -continue PPI  6-hypokalemia: -Mild and most likely secondary to ongoing use of nebulizer therapy -Will replete as needed  7. Tobacco abuse -cessation counseling provided   Procedures:  None   Consultations:  None  Discharge Instructions  Discharge Instructions    Diet - low sodium heart healthy    Complete by:  As directed      Increase activity slowly    Complete by:  As directed             Medication List    STOP taking these medications        OVER THE COUNTER MEDICATION      TAKE these medications        albuterol (2.5 MG/3ML) 0.083% nebulizer solution  Commonly known as:  PROVENTIL  Take 3 mLs by nebulization daily as needed.     aspirin 81 MG tablet  Take 81 mg by mouth daily.     CALCIUM PO  Take 1 tablet by mouth daily.     doxepin 100 MG capsule  Commonly known as:  SINEQUAN  Take 1 capsule by mouth at bedtime.     gabapentin 300 MG capsule  Commonly known as:  NEURONTIN  Take 300 mg by mouth 3 (three)  times daily.     HYDROcodone-acetaminophen 10-325 MG tablet  Commonly known as:  NORCO  Take 1 tablet by mouth every 4 (four) hours as needed for moderate pain.     levofloxacin 500 MG tablet  Commonly known as:  LEVAQUIN  Take 1 tablet (500 mg total) by mouth daily.     levothyroxine 25 MCG tablet  Commonly known as:  SYNTHROID, LEVOTHROID  Take 25 mcg by mouth daily before breakfast.     LORazepam 2 MG tablet  Commonly known as:  ATIVAN  Take 2 mg by mouth every 6 (six) hours as needed  for anxiety.     metoprolol tartrate 25 MG tablet  Commonly known as:  LOPRESSOR  Take 12.5 mg by mouth daily.     predniSONE 10 MG tablet  Commonly known as:  DELTASONE  Take 1 tablet (10 mg total) by mouth daily with breakfast. Take 6 tablets today and then decrease by 1 tablet daily until none are left.       Allergies  Allergen Reactions  . Lipitor [Atorvastatin] Itching       Follow-up Information    Follow up with MANN, BENJAMIN, PA-C. Schedule an appointment as soon as possible for a visit in 2 weeks.   Specialties:  Physician Assistant, Internal Medicine   Contact information:   [address] [place] [address] [phone]        The results of significant diagnostics from this hospitalization (including imaging, microbiology, ancillary and laboratory) are listed below for reference.    Significant Diagnostic Studies: Dg Chest 2 View  08/15/2015  CLINICAL DATA:  Sudden onset sharp right-sided chest pain, onset at 22:00. EXAM: CHEST  2 VIEW COMPARISON:  12/27/2012 FINDINGS: There is moderate vascular and interstitial prominence, new. This likely represents congestive heart failure. There is hyperinflation, with upper lobe emphysematous changes. There are small pleural effusions. There is no confluent airspace consolidation. There is moderate cardiomegaly. IMPRESSION: Probable CHF superimposed on COPD. Electronically Signed   By: Ellery Plunk M.D.   On: 08/15/2015 01:33   Dg Ribs Unilateral Right  08/15/2015  CLINICAL DATA:  Sudden onset sharp right-sided chest wall pain, onset at 22:00. EXAM: RIGHT RIBS - 2 VIEW COMPARISON:  None. FINDINGS: No fracture or other bone lesions are seen involving the ribs. IMPRESSION: Negative. Electronically Signed   By: Ellery Plunk M.D.   On: 08/15/2015 01:41    Microbiology: Recent Results (from the past 240 hour(s))  Blood Culture (routine x 2)     Status: None (Preliminary result)   Collection Time: 08/15/15 12:49  AM  Result Value Ref Range Status   Specimen Description BLOOD RIGHT ARM DRAWN BY RN  Final   Special Requests BOTTLES DRAWN AEROBIC AND ANAEROBIC 4CC EACH  Final   Culture NO GROWTH 2 DAYS  Final   Report Status PENDING  Incomplete  Blood Culture (routine x 2)     Status: None (Preliminary result)   Collection Time: 08/15/15 12:51 AM  Result Value Ref Range Status   Specimen Description BLOOD LEFT HAND DRAWN BY RN  Final   Special Requests BOTTLES DRAWN AEROBIC AND ANAEROBIC 4CC EACH  Final   Culture NO GROWTH 2 DAYS  Final   Report Status PENDING  Incomplete  Urine culture     Status: None   Collection Time: 08/15/15  1:30 AM  Result Value Ref Range Status   Specimen Description URINE, CLEAN CATCH  Final   Special Requests NONE  Final  Culture   Final    NO GROWTH 1 DAY [facility]    Report Status 08/16/2015 FINAL  Final     Labs: Basic Metabolic Panel:  Recent Labs Lab 08/15/15 0050 08/16/15 0534 08/17/15 0514  NA 136 141 144  K 3.5 3.3* 4.4  CL 100* 107 108  CO2 28 26 28   GLUCOSE 132* 95 128*  BUN 19 10 9   CREATININE 0.73 0.45 0.48  CALCIUM 8.5* 8.1* 8.8*   Liver Function Tests:  Recent Labs Lab 08/15/15 0050  AST 17  ALT 12*  ALKPHOS 87  BILITOT 0.5  PROT 7.4  ALBUMIN 3.5   No results for input(s): LIPASE, AMYLASE in the last 168 hours. No results for input(s): AMMONIA in the last 168 hours. CBC:  Recent Labs Lab 08/15/15 0050 08/16/15 0534  WBC 15.2* 10.4  NEUTROABS 10.9*  --   HGB 11.2* 9.6*  HCT 34.6* 29.5*  MCV 85.4 84.8  PLT 228 228   Cardiac Enzymes: No results for input(s): CKTOTAL, CKMB, CKMBINDEX, TROPONINI in the last 168 hours. BNP: BNP (last 3 results) No results for input(s): BNP in the last 8760 hours.  ProBNP (last 3 results) No results for input(s): PROBNP in the last 8760 hours.  CBG: No results for input(s): GLUCAP in the last 168 hours.     SignedChaya Jan:  HERNANDEZ ACOSTA,ESTELA  [facility] Pager: [phone] 08/18/2015, 11:10 AM

## 2015-08-18 NOTE — Progress Notes (Signed)
Down via wheelchair accompanied by staff for discharge home home on portable oxygen at 2.5L  for discharge home in care of family.  Stable at discharge.

## 2015-08-18 NOTE — Progress Notes (Signed)
[facility]  [facility]  Spoke with Misty and delivery window time will be between 3pm to 7pm.

## 2015-08-18 NOTE — Progress Notes (Signed)
Called [facility] to notify of patient's discharge for oxygen t be delivered, patient not in system, called house supervisor, paged Ardyth HarpsHernandez, received phone call from social worker, will have oxygen delivered by [facility] and equipment will be delivered to home.  Patient and family informed.  Discharge instructions reviewed with patient and family questions answered, understanding verbalized.  Waiting delivery of oxygen.

## 2015-08-20 LAB — CULTURE, BLOOD (ROUTINE X 2)
CULTURE: NO GROWTH
CULTURE: NO GROWTH

## 2015-08-28 DIAGNOSIS — J189 Pneumonia, unspecified organism: Secondary | ICD-10-CM | POA: Diagnosis not present

## 2015-08-28 DIAGNOSIS — Z6820 Body mass index (BMI) 20.0-20.9, adult: Secondary | ICD-10-CM | POA: Diagnosis not present

## 2015-08-28 DIAGNOSIS — Z1389 Encounter for screening for other disorder: Secondary | ICD-10-CM | POA: Diagnosis not present

## 2015-09-03 ENCOUNTER — Other Ambulatory Visit: Payer: Self-pay | Admitting: *Deleted

## 2015-09-03 ENCOUNTER — Ambulatory Visit (HOSPITAL_COMMUNITY)
Admission: RE | Admit: 2015-09-03 | Discharge: 2015-09-03 | Disposition: A | Discharge status: Home / Self Care | Payer: Commercial Managed Care - HMO | Source: Ambulatory Visit | Attending: Physician Assistant | Admitting: Physician Assistant

## 2015-09-03 ENCOUNTER — Other Ambulatory Visit (HOSPITAL_COMMUNITY): Payer: Self-pay | Admitting: Physician Assistant

## 2015-09-03 DIAGNOSIS — R918 Other nonspecific abnormal finding of lung field: Secondary | ICD-10-CM | POA: Diagnosis not present

## 2015-09-03 DIAGNOSIS — Z1389 Encounter for screening for other disorder: Secondary | ICD-10-CM | POA: Diagnosis not present

## 2015-09-03 DIAGNOSIS — R0781 Pleurodynia: Secondary | ICD-10-CM

## 2015-09-03 DIAGNOSIS — Z6821 Body mass index (BMI) 21.0-21.9, adult: Secondary | ICD-10-CM | POA: Diagnosis not present

## 2015-09-03 DIAGNOSIS — J189 Pneumonia, unspecified organism: Secondary | ICD-10-CM | POA: Diagnosis not present

## 2015-09-03 NOTE — Patient Outreach (Signed)
[facility] Care Management  09/03/2015  Ashley Houston 04/12/1941 [id number]   [facility] referral: Telephone call to patient who was advised of reason for call. [facility] and unable to [facility] call. States better to call her back tomorrow after noon.   Plan: will follow up. Screening call appointment placed on calendar. [name], RN BSN CCM Care Management Coordinator [facility] Care Management  [phone]

## 2015-09-04 ENCOUNTER — Other Ambulatory Visit: Payer: Self-pay | Admitting: *Deleted

## 2015-09-04 NOTE — Patient Outreach (Signed)
[facility] Care Management  09/04/2015  Ashley Houston 09/12/1940 [id number]   Telephone call to patient; left message on voice mail requesting call back.  Plan: will follow up.  Ashley CanLinda Shayana Hornstein, RN BSN CCM Care Management Coordinator [facility] Care Management  [phone]

## 2015-09-05 ENCOUNTER — Ambulatory Visit: Payer: Self-pay | Admitting: *Deleted

## 2015-09-07 ENCOUNTER — Inpatient Hospital Stay (HOSPITAL_COMMUNITY)
Admission: EM | Admit: 2015-09-07 | Discharge: 2015-09-10 | DRG: 190 | Disposition: A | Discharge status: Home / Self Care | Payer: Commercial Managed Care - HMO | Attending: Internal Medicine | Admitting: Internal Medicine

## 2015-09-07 ENCOUNTER — Emergency Department (HOSPITAL_COMMUNITY): Payer: Commercial Managed Care - HMO

## 2015-09-07 ENCOUNTER — Encounter (HOSPITAL_COMMUNITY): Payer: Self-pay | Admitting: Emergency Medicine

## 2015-09-07 DIAGNOSIS — R531 Weakness: Secondary | ICD-10-CM | POA: Diagnosis not present

## 2015-09-07 DIAGNOSIS — Z7982 Long term (current) use of aspirin: Secondary | ICD-10-CM | POA: Diagnosis not present

## 2015-09-07 DIAGNOSIS — T380X5A Adverse effect of glucocorticoids and synthetic analogues, initial encounter: Secondary | ICD-10-CM | POA: Diagnosis present

## 2015-09-07 DIAGNOSIS — R0602 Shortness of breath: Secondary | ICD-10-CM | POA: Diagnosis present

## 2015-09-07 DIAGNOSIS — J441 Chronic obstructive pulmonary disease with (acute) exacerbation: Secondary | ICD-10-CM | POA: Diagnosis present

## 2015-09-07 DIAGNOSIS — J440 Chronic obstructive pulmonary disease with acute lower respiratory infection: Secondary | ICD-10-CM | POA: Diagnosis present

## 2015-09-07 DIAGNOSIS — F17210 Nicotine dependence, cigarettes, uncomplicated: Secondary | ICD-10-CM | POA: Diagnosis not present

## 2015-09-07 DIAGNOSIS — D649 Anemia, unspecified: Secondary | ICD-10-CM | POA: Diagnosis present

## 2015-09-07 DIAGNOSIS — R42 Dizziness and giddiness: Secondary | ICD-10-CM | POA: Diagnosis not present

## 2015-09-07 DIAGNOSIS — T50905A Adverse effect of unspecified drugs, medicaments and biological substances, initial encounter: Secondary | ICD-10-CM

## 2015-09-07 DIAGNOSIS — J438 Other emphysema: Secondary | ICD-10-CM

## 2015-09-07 DIAGNOSIS — R404 Transient alteration of awareness: Secondary | ICD-10-CM | POA: Diagnosis not present

## 2015-09-07 DIAGNOSIS — F329 Major depressive disorder, single episode, unspecified: Secondary | ICD-10-CM | POA: Diagnosis not present

## 2015-09-07 DIAGNOSIS — J9601 Acute respiratory failure with hypoxia: Secondary | ICD-10-CM | POA: Diagnosis present

## 2015-09-07 DIAGNOSIS — J9621 Acute and chronic respiratory failure with hypoxia: Secondary | ICD-10-CM | POA: Diagnosis present

## 2015-09-07 DIAGNOSIS — E039 Hypothyroidism, unspecified: Secondary | ICD-10-CM | POA: Diagnosis present

## 2015-09-07 DIAGNOSIS — R739 Hyperglycemia, unspecified: Secondary | ICD-10-CM | POA: Diagnosis present

## 2015-09-07 DIAGNOSIS — Z720 Tobacco use: Secondary | ICD-10-CM | POA: Diagnosis present

## 2015-09-07 DIAGNOSIS — Y95 Nosocomial condition: Secondary | ICD-10-CM | POA: Diagnosis present

## 2015-09-07 DIAGNOSIS — J439 Emphysema, unspecified: Secondary | ICD-10-CM | POA: Diagnosis present

## 2015-09-07 DIAGNOSIS — I5032 Chronic diastolic (congestive) heart failure: Secondary | ICD-10-CM | POA: Diagnosis present

## 2015-09-07 DIAGNOSIS — J189 Pneumonia, unspecified organism: Secondary | ICD-10-CM | POA: Insufficient documentation

## 2015-09-07 DIAGNOSIS — Z9981 Dependence on supplemental oxygen: Secondary | ICD-10-CM

## 2015-09-07 HISTORY — DX: Hypothyroidism, unspecified: E03.9

## 2015-09-07 HISTORY — DX: Chronic diastolic (congestive) heart failure: I50.32

## 2015-09-07 LAB — COMPREHENSIVE METABOLIC PANEL
ALK PHOS: 79 U/L (ref 38–126)
ALT: 12 U/L — AB (ref 14–54)
AST: 18 U/L (ref 15–41)
Albumin: 3.1 g/dL — ABNORMAL LOW (ref 3.5–5.0)
Anion gap: 10 (ref 5–15)
BILIRUBIN TOTAL: 0.5 mg/dL (ref 0.3–1.2)
BUN: 15 mg/dL (ref 6–20)
CALCIUM: 9.3 mg/dL (ref 8.9–10.3)
CO2: 27 mmol/L (ref 22–32)
CREATININE: 0.85 mg/dL (ref 0.44–1.00)
Chloride: 101 mmol/L (ref 101–111)
GFR calc non Af Amer: >60 mL/min (ref >60)
GLUCOSE: 97 mg/dL (ref 65–99)
Potassium: 4.7 mmol/L (ref 3.5–5.1)
Sodium: 138 mmol/L (ref 135–145)
Total Protein: 7.4 g/dL (ref 6.5–8.1)

## 2015-09-07 LAB — CBC WITH DIFFERENTIAL/PLATELET
Basophils Absolute: 0 10*3/uL (ref 0.0–0.1)
Basophils Relative: 0 %
EOS PCT: 3 %
Eosinophils Absolute: 0.2 10*3/uL (ref 0.0–0.7)
HCT: 32.9 % — ABNORMAL LOW (ref 36.0–46.0)
Hemoglobin: 10.6 g/dL — ABNORMAL LOW (ref 12.0–15.0)
LYMPHS ABS: 1.5 10*3/uL (ref 0.7–4.0)
LYMPHS PCT: 20 %
MCH: 27.3 pg (ref 26.0–34.0)
MCHC: 32.2 g/dL (ref 30.0–36.0)
MCV: 84.8 fL (ref 78.0–100.0)
MONO ABS: 0.7 10*3/uL (ref 0.1–1.0)
Monocytes Relative: 10 %
Neutro Abs: 5 10*3/uL (ref 1.7–7.7)
Neutrophils Relative %: 68 %
PLATELETS: 302 10*3/uL (ref 150–400)
RBC: 3.88 MIL/uL (ref 3.87–5.11)
RDW: 16.8 % — AB (ref 11.5–15.5)
WBC: 7.4 10*3/uL (ref 4.0–10.5)

## 2015-09-07 LAB — I-STAT CG4 LACTIC ACID, ED
LACTIC ACID, VENOUS: 0.52 mmol/L (ref 0.5–2.0)
LACTIC ACID, VENOUS: 0.68 mmol/L (ref 0.5–2.0)

## 2015-09-07 LAB — TROPONIN I: Troponin I: <0.03 ng/mL (ref <0.031)

## 2015-09-07 MED ORDER — ENOXAPARIN SODIUM 40 MG/0.4ML ~~LOC~~ SOLN
40.0000 mg | SUBCUTANEOUS | Status: DC
  Administered 2015-09-07 – 2015-09-09 (×3): 40 mg via SUBCUTANEOUS
  Filled 2015-09-07 (×3): qty 0.4

## 2015-09-07 MED ORDER — PIPERACILLIN-TAZOBACTAM 3.375 G IVPB 30 MIN
3.3750 g | Freq: Once | INTRAVENOUS | Status: AC
  Administered 2015-09-07: 3.375 g via INTRAVENOUS
  Filled 2015-09-07: qty 50

## 2015-09-07 MED ORDER — LEVOTHYROXINE SODIUM 25 MCG PO TABS
25.0000 ug | ORAL_TABLET | Freq: Every day | ORAL | Status: DC
  Administered 2015-09-08 – 2015-09-10 (×3): 25 ug via ORAL
  Filled 2015-09-07 (×3): qty 1

## 2015-09-07 MED ORDER — ALBUTEROL SULFATE (2.5 MG/3ML) 0.083% IN NEBU
2.5000 mg | INHALATION_SOLUTION | Freq: Four times a day (QID) | RESPIRATORY_TRACT | Status: DC
  Administered 2015-09-07: 2.5 mg via RESPIRATORY_TRACT
  Filled 2015-09-07: qty 3

## 2015-09-07 MED ORDER — GABAPENTIN 300 MG PO CAPS
300.0000 mg | ORAL_CAPSULE | Freq: Three times a day (TID) | ORAL | Status: DC
  Administered 2015-09-07 – 2015-09-10 (×8): 300 mg via ORAL
  Filled 2015-09-07 (×8): qty 1

## 2015-09-07 MED ORDER — METHYLPREDNISOLONE SODIUM SUCC 125 MG IJ SOLR
60.0000 mg | Freq: Two times a day (BID) | INTRAMUSCULAR | Status: DC
  Administered 2015-09-08 – 2015-09-10 (×5): 60 mg via INTRAVENOUS
  Filled 2015-09-07 (×5): qty 2

## 2015-09-07 MED ORDER — ASPIRIN 81 MG PO CHEW
81.0000 mg | CHEWABLE_TABLET | Freq: Every day | ORAL | Status: DC
  Administered 2015-09-08 – 2015-09-10 (×3): 81 mg via ORAL
  Filled 2015-09-07 (×3): qty 1

## 2015-09-07 MED ORDER — CEFEPIME HCL 1 G IJ SOLR
1.0000 g | Freq: Three times a day (TID) | INTRAMUSCULAR | Status: DC
  Administered 2015-09-07 – 2015-09-10 (×8): 1 g via INTRAVENOUS
  Filled 2015-09-07 (×12): qty 1

## 2015-09-07 MED ORDER — DEXTROSE 5 % IV SOLN
INTRAVENOUS | Status: AC
  Filled 2015-09-07 (×2): qty 1

## 2015-09-07 MED ORDER — VANCOMYCIN HCL IN DEXTROSE 1-5 GM/200ML-% IV SOLN
1000.0000 mg | Freq: Once | INTRAVENOUS | Status: AC
  Administered 2015-09-07: 1000 mg via INTRAVENOUS
  Filled 2015-09-07: qty 200

## 2015-09-07 MED ORDER — DOXEPIN HCL 25 MG PO CAPS
100.0000 mg | ORAL_CAPSULE | Freq: Every day | ORAL | Status: DC
  Administered 2015-09-07 – 2015-09-09 (×3): 100 mg via ORAL
  Filled 2015-09-07 (×3): qty 4

## 2015-09-07 MED ORDER — SODIUM CHLORIDE 0.9 % IV BOLUS (SEPSIS)
1000.0000 mL | Freq: Once | INTRAVENOUS | Status: AC
Start: 2015-09-07 — End: 2015-09-07
  Administered 2015-09-07: 1000 mL via INTRAVENOUS

## 2015-09-07 MED ORDER — METOPROLOL SUCCINATE ER 25 MG PO TB24
25.0000 mg | ORAL_TABLET | Freq: Every day | ORAL | Status: DC
  Administered 2015-09-08 – 2015-09-10 (×3): 25 mg via ORAL
  Filled 2015-09-07 (×3): qty 1

## 2015-09-07 MED ORDER — METHYLPREDNISOLONE SODIUM SUCC 125 MG IJ SOLR
125.0000 mg | Freq: Once | INTRAMUSCULAR | Status: AC
  Administered 2015-09-07: 125 mg via INTRAVENOUS
  Filled 2015-09-07: qty 2

## 2015-09-07 MED ORDER — ALBUTEROL SULFATE (2.5 MG/3ML) 0.083% IN NEBU: 2.5000 mg | INHALATION_SOLUTION | RESPIRATORY_TRACT | Status: DC | PRN

## 2015-09-07 MED ORDER — LORAZEPAM 1 MG PO TABS
2.0000 mg | ORAL_TABLET | Freq: Four times a day (QID) | ORAL | Status: DC | PRN
  Administered 2015-09-08 – 2015-09-09 (×4): 2 mg via ORAL
  Filled 2015-09-07 (×4): qty 2

## 2015-09-07 MED ORDER — VANCOMYCIN HCL 500 MG IV SOLR
500.0000 mg | Freq: Two times a day (BID) | INTRAVENOUS | Status: DC
  Administered 2015-09-08 – 2015-09-10 (×5): 500 mg via INTRAVENOUS
  Filled 2015-09-07 (×7): qty 500

## 2015-09-07 MED ORDER — HYDROCODONE-ACETAMINOPHEN 10-325 MG PO TABS
1.0000 | ORAL_TABLET | ORAL | Status: DC | PRN
  Administered 2015-09-08 – 2015-09-10 (×6): 1 via ORAL
  Filled 2015-09-07 (×6): qty 1

## 2015-09-07 NOTE — ED Provider Notes (Signed)
CSN: [id number]     Arrival date & time 09/07/15  1317 History   First MD Initiated Contact with Patient 09/07/15 1325     Chief Complaint  Patient presents with  . Dizziness     (Consider location/radiation/quality/duration/timing/severity/associated sxs/prior Treatment) Patient is a 75 y.o. female presenting with dizziness. [name] (The patient states she has been fatigued and having some chills since this weekend. She recently was discharged from the hospital with pneumonia got better and is now having symptoms again. She saw her family doctor Tuesday and he put her on Levaquin).  Dizziness Quality:  Lightheadedness Severity:  Moderate Onset quality:  Sudden Timing:  Intermittent Progression:  Waxing and waning Chronicity:  Recurrent Context: not with head movement   Relieved by:  Nothing Worsened by:  Nothing Ineffective treatments:  None tried Associated symptoms: no chest pain, no diarrhea and no headaches     Past Medical History  Diagnosis Date  . COPD (chronic obstructive pulmonary disease) (HCC)   . Tachycardia   . Thyroid disease   . Anxiety   . Chronic pain     hips back and shoulders  . Depression    Past Surgical History  Procedure Laterality Date  . Abdominal hysterectomy    . Tonsillectomy    . Cholecystectomy     History reviewed. No pertinent family history. Social History  Substance Use Topics  . Smoking status: Current Every Day Smoker -- 0.50 packs/day    Types: Cigarettes  . Smokeless tobacco: Never Used  . Alcohol Use: No   OB History    Gravida Para Term Preterm AB TAB SAB Ectopic Multiple Living   5 5 5             Review of Systems  Constitutional: Negative for appetite change and fatigue.  HENT: Negative for congestion, ear discharge and sinus pressure.   Eyes: Negative for discharge.  Respiratory: Negative for cough.        Sob   Cardiovascular: Negative for chest pain.  Gastrointestinal: Negative  for abdominal pain and diarrhea.  Genitourinary: Negative for frequency and hematuria.  Musculoskeletal: Negative for back pain.  Skin: Negative for rash.  Neurological: Positive for dizziness. Negative for seizures and headaches.  Psychiatric/Behavioral: Negative for hallucinations.      Allergies  Lipitor  Home Medications   Prior to Admission medications   Medication Sig Start Date End Date Taking? Authorizing Provider  albuterol (PROVENTIL) (2.5 MG/3ML) 0.083% nebulizer solution Take 3 mLs by nebulization daily as needed. 08/04/15  Yes Historical Provider, MD  aspirin 81 MG tablet Take 81 mg by mouth daily.   Yes Historical Provider, MD  CALCIUM PO Take 1 tablet by mouth daily.   Yes Historical Provider, MD  doxepin (SINEQUAN) 100 MG capsule Take 1 capsule by mouth at bedtime. 07/31/15  Yes Historical Provider, MD  gabapentin (NEURONTIN) 300 MG capsule Take 300 mg by mouth 3 (three) times daily.   Yes Historical Provider, MD  HYDROcodone-acetaminophen (NORCO) 10-325 MG tablet Take 1 tablet by mouth every 4 (four) hours as needed for moderate pain.    Yes Historical Provider, MD  levofloxacin (LEVAQUIN) 750 MG tablet  09/03/15  Yes Historical Provider, MD  levothyroxine (SYNTHROID, LEVOTHROID) 25 MCG tablet Take 25 mcg by mouth daily before breakfast.   Yes Historical Provider, MD  LORazepam (ATIVAN) 2 MG tablet Take 2 mg by mouth every 6 (six) hours as needed for anxiety.   Yes Historical Provider,  MD  metoprolol succinate (TOPROL-XL) 25 MG 24 hr tablet  08/30/15  Yes Historical Provider, MD  metoprolol tartrate (LOPRESSOR) 25 MG tablet Take 12.5 mg by mouth daily.   Yes Historical Provider, MD  predniSONE (DELTASONE) 10 MG tablet Take 1 tablet (10 mg total) by mouth daily with breakfast. Take 6 tablets today and then decrease by 1 tablet daily until none are left. Patient not taking: Reported on 09/07/2015 08/18/15   Henderson Cloud, MD   BP 95/76 mmHg  Pulse 70  Temp(Src)  98.2 F (36.8 C) (Oral)  Resp 19  Ht  (1.676 m)  Wt 123 lb (55.792 kg)  BMI 19.86 kg/m2  SpO2 85% Physical Exam  Constitutional: She is oriented to person, place, and time. She appears well-developed.  HENT:  Head: Normocephalic.  Eyes: Conjunctivae and EOM are normal. No scleral icterus.  Neck: Neck supple. No thyromegaly present.  Cardiovascular: Normal rate and regular rhythm.  Exam reveals no gallop and no friction rub.   No murmur heard. Pulmonary/Chest: No stridor. She has no wheezes. She has no rales. She exhibits no tenderness.  Abdominal: She exhibits no distension. There is no tenderness. There is no rebound.  Musculoskeletal: Normal range of motion. She exhibits no edema.  Patient was unable to ambulate because when she stood up she felt too weak and lightheaded  Lymphadenopathy:    She has no cervical adenopathy.  Neurological: She is oriented to person, place, and time. She exhibits normal muscle tone. Coordination normal.  Skin: No rash noted. No erythema.  Psychiatric: She has a normal mood and affect. Her behavior is normal.    ED Course  Procedures (including critical care time) Labs Review Labs Reviewed  COMPREHENSIVE METABOLIC PANEL - Abnormal; Notable for the following:    Albumin 3.1 (*)    ALT 12 (*)    All other components within normal limits  CBC WITH DIFFERENTIAL/PLATELET - Abnormal; Notable for the following:    Hemoglobin 10.6 (*)    HCT 32.9 (*)    RDW 16.8 (*)    All other components within normal limits  TROPONIN I  URINALYSIS, ROUTINE W REFLEX MICROSCOPIC (NOT AT [facility])  I-STAT CG4 LACTIC ACID, ED    Imaging Review Dg Chest 2 View  09/07/2015  CLINICAL DATA:  Pneumonia. Intermittent weakness and dizziness for 3 weeks. Symptoms worse today. EXAM: CHEST  2 VIEW COMPARISON:  Radiographs 09/03/2015 FINDINGS: Small focal right mid lung zone opacity is unchanged from recent prior. Background emphysema wood lower lung volumes from prior.  Bibasilar subsegmental atelectasis, left greater than right. Heart size and mediastinal contours are unchanged allowing for rotation. No pleural effusion. No pneumothorax. IMPRESSION: Unchanged opacity in the right mid lung zone, likely anterior right upper lobe, concerning for pneumonia. No progression or change over the past 4 days. Background emphysema.  Subsegmental atelectasis at the lung bases. Electronically Signed   By: Rubye Oaks M.D.   On: 09/07/2015 14:44   I have personally reviewed and evaluated these images and lab results as part of my medical decision-making.   EKG Interpretation None      MDM   Final diagnoses:  Community acquired pneumonia   Patient with pneumonia she'll be admitted for antibiotics and steroids with COPD   Bethann Berkshire, MD 09/07/15 1600

## 2015-09-07 NOTE — ED Notes (Signed)
Pt c/o intermittent weakness and dizziness since hospital d/c 4/8. Pt states symptoms became worse today. Pt had been hospitalized for pneumonia. Pt seen by PCP on Monday and dx with new area of pneumonia.

## 2015-09-07 NOTE — Progress Notes (Signed)
Pharmacy Antibiotic Note  Ashley Houston is a 75 y.o. female admitted on 09/07/2015 with pneumonia.  Pharmacy has been consulted for vancomycin and renal dose antibiotics  Vancomycin 500 mg IV q12 hours Cont cefepime 1 gm IV q8 hours  Height: 5\' 6"  (167.6 cm) Weight: 123 lb (55.792 kg) IBW/kg (Calculated) : 59.3  Temp (24hrs), Avg:98.3 F (36.8 C), Min:98.2 F (36.8 C), Max:98.3 F (36.8 C)   Recent Labs Lab 09/07/15 1350 09/07/15 1431 09/07/15 1714  WBC 7.4  --   --   CREATININE 0.85  --   --   LATICACIDVEN  --  0.52 0.68    Estimated Creatinine Clearance: 51.9 mL/min (by C-G formula based on Cr of 0.85).    Allergies  Allergen Reactions  . Lipitor [Atorvastatin] Itching    Antimicrobials this admission: vanc 4/28 >>  cefepime 4/28 >>    Thank you for allowing pharmacy to be a part of this patient's care.  Talbert CageSeay, Ashaun Gaughan Poteet 09/07/2015 7:07 PM

## 2015-09-07 NOTE — H&P (Signed)
History and Physical  Ashley Houston ZOX:[id number] DOB: Jun 15, 1940 DOA: 09/07/2015  Referring physician: Dr Ashley Houston, ED physician PCP: Ashley Herald, PA-C   Chief Complaint: Shortness of breath  HPI: Ashley Houston is a 75 y.o. female with a history of COPD, tachycardia, chronic pain, thyroid disease, and depression. Patient was recently hospitalized at the beginning of this month for pneumonia. The patient was discharged to home after 3 days to finish her prescription of levofloxacin. She was discharged on home O2. Patient had persistent mild dyspnea that worsened approximately week ago. The patient saw her outpatient doctor beginning of the week and was prescribed levofloxacin again. Patient also started her home O2 again, as she had discontinued this when her antibiotics ran out. As her symptoms were not improving, particularly her shortness of breath, wheezing, and generalized weakness, the patient presented to the hospital for evaluation. On arrival, the patient was hypoxic. She was placed on oxygen with improvement. Patient also states that she's been doing her nebulizer treatments at home, which improves her shortness of breath briefly. Ambulation and activity make her dyspnea worse. Additionally, she has had a mildly productive cough.   Review of Systems:   Pt denies any fevers, chills, nausea, vomiting, diarrhea, constipation, abdominal pain, orthopnea, cough, wheezing, palpitations, headache, vision changes, lightheadedness, dizziness, melena, rectal bleeding.  Review of systems are otherwise negative  Past Medical History  Diagnosis Date  . COPD (chronic obstructive pulmonary disease) (HCC)   . Tachycardia   . Thyroid disease   . Anxiety   . Chronic pain     hips back and shoulders  . Depression    Past Surgical History  Procedure Laterality Date  . Abdominal hysterectomy    . Tonsillectomy    . Cholecystectomy     Social History:  reports that she has been smoking  Cigarettes.  She has been smoking about 0.50 packs per day. She has never used smokeless tobacco. She reports that she does not drink alcohol or use illicit drugs. Patient lives at home  Allergies  Allergen Reactions  . Lipitor [Atorvastatin] Itching    History reviewed. No pertinent family history.   Prior to Admission medications   Medication Sig Start Date End Date Taking? Authorizing Provider  albuterol (PROVENTIL) (2.5 MG/3ML) 0.083% nebulizer solution Take 3 mLs by nebulization daily as needed. 08/04/15  Yes Historical Provider, MD  aspirin 81 MG tablet Take 81 mg by mouth daily.   Yes Historical Provider, MD  CALCIUM PO Take 1 tablet by mouth daily.   Yes Historical Provider, MD  doxepin (SINEQUAN) 100 MG capsule Take 1 capsule by mouth at bedtime. 07/31/15  Yes Historical Provider, MD  gabapentin (NEURONTIN) 300 MG capsule Take 300 mg by mouth 3 (three) times daily.   Yes Historical Provider, MD  HYDROcodone-acetaminophen (NORCO) 10-325 MG tablet Take 1 tablet by mouth every 4 (four) hours as needed for moderate pain.    Yes Historical Provider, MD  levofloxacin (LEVAQUIN) 750 MG tablet  09/03/15  Yes Historical Provider, MD  levothyroxine (SYNTHROID, LEVOTHROID) 25 MCG tablet Take 25 mcg by mouth daily before breakfast.   Yes Historical Provider, MD  LORazepam (ATIVAN) 2 MG tablet Take 2 mg by mouth every 6 (six) hours as needed for anxiety.   Yes Historical Provider, MD  metoprolol succinate (TOPROL-XL) 25 MG 24 hr tablet  08/30/15  Yes Historical Provider, MD  metoprolol tartrate (LOPRESSOR) 25 MG tablet Take 12.5 mg by mouth daily.   Yes Historical Provider, MD  predniSONE (DELTASONE) 10 MG tablet Take 1 tablet (10 mg total) by mouth daily with breakfast. Take 6 tablets today and then decrease by 1 tablet daily until none are left. Patient not taking: Reported on 09/07/2015 08/18/15   Henderson Cloud, MD    Physical Exam: BP 113/63 mmHg  Pulse 73  Temp(Src) 98.2 F (36.8  C) (Oral)  Resp 20  Ht  (1.676 m)  Wt 55.792 kg (123 lb)  BMI 19.86 kg/m2  SpO2 96%  General: Older Caucasian female. Awake and alert and oriented x3. No acute cardiopulmonary distress.  HEENT: Normocephalic atraumatic.  Right and left ears normal in appearance.  Pupils equal, round, reactive to light. Extraocular muscles are intact. Sclerae anicteric and noninjected.  Moist mucosal membranes. No mucosal lesions.  Neck: Neck supple without lymphadenopathy. No carotid bruits. No masses palpated.  Cardiovascular: Regular rate with normal S1-S2 sounds. No murmurs, rubs, gallops auscultated. No JVD.  Respiratory: Diminished breath sounds. Rales on the right side. Wheezes diffusely throughout. Abdomen: Soft, nontender, nondistended. Active bowel sounds. No masses or hepatosplenomegaly  Skin: No rashes, lesions, or ulcerations.  Dry, warm to touch. 2+ dorsalis pedis and radial pulses. Musculoskeletal: No calf or leg pain. All major joints not erythematous nontender.  No upper or lower joint deformation.  Good ROM.  No contractures  Psychiatric: Intact judgment and insight. Pleasant and cooperative. Neurologic: No focal neurological deficits. Strength is 5/5 and symmetric in upper and lower extremities.  Cranial nerves II through XII are grossly intact.           Labs on Admission: I have personally reviewed following labs and imaging studies  CBC:  Recent Labs Lab 09/07/15 1350  WBC 7.4  NEUTROABS 5.0  HGB 10.6*  HCT 32.9*  MCV 84.8  PLT 302   Basic Metabolic Panel:  Recent Labs Lab 09/07/15 1350  NA 138  K 4.7  CL 101  CO2 27  GLUCOSE 97  BUN 15  CREATININE 0.85  CALCIUM 9.3   GFR: Estimated Creatinine Clearance: 51.9 mL/min (by C-G formula based on Cr of 0.85). Liver Function Tests:  Recent Labs Lab 09/07/15 1350  AST 18  ALT 12*  ALKPHOS 79  BILITOT 0.5  PROT 7.4  ALBUMIN 3.1*   No results for input(s): LIPASE, AMYLASE in the last 168 hours. No  results for input(s): AMMONIA in the last 168 hours. Coagulation Profile: No results for input(s): INR, PROTIME in the last 168 hours. Cardiac Enzymes:  Recent Labs Lab 09/07/15 1350  TROPONINI <0.03   BNP (last 3 results) No results for input(s): PROBNP in the last 8760 hours. HbA1C: No results for input(s): HGBA1C in the last 72 hours. CBG: No results for input(s): GLUCAP in the last 168 hours. Lipid Profile: No results for input(s): CHOL, HDL, LDLCALC, TRIG, CHOLHDL, LDLDIRECT in the last 72 hours. Thyroid Function Tests: No results for input(s): TSH, T4TOTAL, FREET4, T3FREE, THYROIDAB in the last 72 hours. Anemia Panel: No results for input(s): VITAMINB12, FOLATE, FERRITIN, TIBC, IRON, RETICCTPCT in the last 72 hours. Urine analysis:    Component Value Date/Time   COLORURINE ORANGE* 08/15/2015 0130   APPEARANCEUR CLEAR 08/15/2015 0130   LABSPEC 1.020 08/15/2015 0130   PHURINE 6.0 08/15/2015 0130   GLUCOSEU NEGATIVE 08/15/2015 0130   HGBUR NEGATIVE 08/15/2015 0130   BILIRUBINUR NEGATIVE 08/15/2015 0130   KETONESUR NEGATIVE 08/15/2015 0130   PROTEINUR NEGATIVE 08/15/2015 0130   NITRITE NEGATIVE 08/15/2015 0130   LEUKOCYTESUR NEGATIVE 08/15/2015 0130  Sepsis Labs: @LABRCNTIP (procalcitonin:4,lacticidven:4) )No results found for this or any previous visit (from the past 240 hour(s)).   Radiological Exams on Admission: Dg Chest 2 View  09/07/2015  CLINICAL DATA:  Pneumonia. Intermittent weakness and dizziness for 3 weeks. Symptoms worse today. EXAM: CHEST  2 VIEW COMPARISON:  Radiographs 09/03/2015 FINDINGS: Small focal right mid lung zone opacity is unchanged from recent prior. Background emphysema wood lower lung volumes from prior. Bibasilar subsegmental atelectasis, left greater than right. Heart size and mediastinal contours are unchanged allowing for rotation. No pleural effusion. No pneumothorax. IMPRESSION: Unchanged opacity in the right mid lung zone, likely  anterior right upper lobe, concerning for pneumonia. No progression or change over the past 4 days. Background emphysema.  Subsegmental atelectasis at the lung bases. Electronically Signed   By: Rubye OaksMelanie  Ehinger M.D.   On: 09/07/2015 14:44    EKG: Independently reviewed. Sinus rhythm. No acute ST changes.  Assessment/Plan: Active Problems:   COPD (chronic obstructive pulmonary disease) (HCC)   Pneumonia   Acute respiratory failure with hypoxia (HCC)   Generalized weakness    This patient was discussed with the ED physician, including pertinent vitals, physical exam findings, labs, and imaging.  We also discussed care given by the ED provider.  #1 acute respiratory failure with hypoxia  Admit to MedSurg  Oxygen to maintain saturation #2 hospital associated pneumonia  Reviewing x-ray shows that this is a new right middle lobe pneumonia  Vancomycin and cefepime  Blood cultures  Sputum culture  Strep antigen by urine  Repeat CBC and metabolic panel in the morning #3 COPD exacerbation  I requested that the ED physician start Solu-Medrol  We'll continue Solu-Medrol 60 mg IV every 12 hours  Nebulizer treatments #4 depression  Continue home antidepressants #5 generalized weakness  Consult PT  Patient may need SNIF placement  DVT prophylaxis: Lovenox Consultants: None Code Status: Full code Family Communication: Family in the room  Disposition Plan: Admission, may need skilled nursing or rehabilitation   Ashley HeritageJacob J Lupita Rosales, DO [facility] Pager [phone]  If 7PM-7AM, please contact night-coverage [email or web address] Password [email or web address]

## 2015-09-07 NOTE — Progress Notes (Signed)
Patient very apprehensive about having an ABG. RT was unable to obtain the sample.

## 2015-09-08 ENCOUNTER — Encounter (HOSPITAL_COMMUNITY): Payer: Self-pay | Admitting: Internal Medicine

## 2015-09-08 DIAGNOSIS — T50905A Adverse effect of unspecified drugs, medicaments and biological substances, initial encounter: Secondary | ICD-10-CM | POA: Diagnosis present

## 2015-09-08 DIAGNOSIS — J439 Emphysema, unspecified: Secondary | ICD-10-CM

## 2015-09-08 DIAGNOSIS — I5032 Chronic diastolic (congestive) heart failure: Secondary | ICD-10-CM

## 2015-09-08 DIAGNOSIS — J189 Pneumonia, unspecified organism: Secondary | ICD-10-CM

## 2015-09-08 DIAGNOSIS — Z720 Tobacco use: Secondary | ICD-10-CM

## 2015-09-08 DIAGNOSIS — E039 Hypothyroidism, unspecified: Secondary | ICD-10-CM

## 2015-09-08 DIAGNOSIS — R739 Hyperglycemia, unspecified: Secondary | ICD-10-CM | POA: Diagnosis present

## 2015-09-08 DIAGNOSIS — J441 Chronic obstructive pulmonary disease with (acute) exacerbation: Secondary | ICD-10-CM | POA: Diagnosis present

## 2015-09-08 DIAGNOSIS — J9601 Acute respiratory failure with hypoxia: Secondary | ICD-10-CM

## 2015-09-08 HISTORY — DX: Hypothyroidism, unspecified: E03.9

## 2015-09-08 LAB — CBC
HEMATOCRIT: 34.5 % — AB (ref 36.0–46.0)
Hemoglobin: 10.9 g/dL — ABNORMAL LOW (ref 12.0–15.0)
MCH: 26.7 pg (ref 26.0–34.0)
MCHC: 31.6 g/dL (ref 30.0–36.0)
MCV: 84.4 fL (ref 78.0–100.0)
PLATELETS: 407 10*3/uL — AB (ref 150–400)
RBC: 4.09 MIL/uL (ref 3.87–5.11)
RDW: 16.5 % — AB (ref 11.5–15.5)
WBC: 6.7 10*3/uL (ref 4.0–10.5)

## 2015-09-08 LAB — BASIC METABOLIC PANEL
ANION GAP: 11 (ref 5–15)
BUN: 13 mg/dL (ref 6–20)
CHLORIDE: 105 mmol/L (ref 101–111)
CO2: 25 mmol/L (ref 22–32)
Calcium: 9.5 mg/dL (ref 8.9–10.3)
Creatinine, Ser: 0.62 mg/dL (ref 0.44–1.00)
GFR calc non Af Amer: >60 mL/min (ref >60)
Glucose, Bld: 145 mg/dL — ABNORMAL HIGH (ref 65–99)
POTASSIUM: 3.9 mmol/L (ref 3.5–5.1)
Sodium: 141 mmol/L (ref 135–145)

## 2015-09-08 LAB — GLUCOSE, CAPILLARY
GLUCOSE-CAPILLARY: 154 mg/dL — AB (ref 65–99)
GLUCOSE-CAPILLARY: 124 mg/dL — AB (ref 65–99)
GLUCOSE-CAPILLARY: 140 mg/dL — AB (ref 65–99)

## 2015-09-08 LAB — URINALYSIS, ROUTINE W REFLEX MICROSCOPIC
Bilirubin Urine: NEGATIVE
Glucose, UA: NEGATIVE mg/dL
Hgb urine dipstick: NEGATIVE
KETONES UR: NEGATIVE mg/dL
LEUKOCYTES UA: NEGATIVE
NITRITE: NEGATIVE
PH: 7 (ref 5.0–8.0)
PROTEIN: NEGATIVE mg/dL
Specific Gravity, Urine: 1.01 (ref 1.005–1.030)

## 2015-09-08 LAB — STREP PNEUMONIAE URINARY ANTIGEN: Strep Pneumo Urinary Antigen: NEGATIVE

## 2015-09-08 MED ORDER — INSULIN ASPART 100 UNIT/ML ~~LOC~~ SOLN
0.0000 [IU] | Freq: Three times a day (TID) | SUBCUTANEOUS | Status: DC
  Administered 2015-09-08: 3 [IU] via SUBCUTANEOUS
  Administered 2015-09-08: 4 [IU] via SUBCUTANEOUS

## 2015-09-08 MED ORDER — VANCOMYCIN HCL 500 MG IV SOLR
INTRAVENOUS | Status: AC
  Filled 2015-09-08: qty 500

## 2015-09-08 MED ORDER — INSULIN ASPART 100 UNIT/ML ~~LOC~~ SOLN
0.0000 [IU] | Freq: Every day | SUBCUTANEOUS | Status: DC
Start: 2015-09-08 — End: 2015-09-08

## 2015-09-08 MED ORDER — INSULIN ASPART 100 UNIT/ML ~~LOC~~ SOLN
0.0000 [IU] | Freq: Three times a day (TID) | SUBCUTANEOUS | Status: DC
  Administered 2015-09-08: 2 [IU] via SUBCUTANEOUS
  Administered 2015-09-09: 3 [IU] via SUBCUTANEOUS
  Administered 2015-09-09 – 2015-09-10 (×2): 2 [IU] via SUBCUTANEOUS

## 2015-09-08 MED ORDER — ALBUTEROL SULFATE (2.5 MG/3ML) 0.083% IN NEBU
2.5000 mg | INHALATION_SOLUTION | Freq: Three times a day (TID) | RESPIRATORY_TRACT | Status: DC
  Administered 2015-09-08 – 2015-09-10 (×7): 2.5 mg via RESPIRATORY_TRACT
  Filled 2015-09-08 (×7): qty 3

## 2015-09-08 MED ORDER — TIOTROPIUM BROMIDE MONOHYDRATE 18 MCG IN CAPS
18.0000 ug | ORAL_CAPSULE | Freq: Every day | RESPIRATORY_TRACT | Status: DC
  Administered 2015-09-09 – 2015-09-10 (×2): 18 ug via RESPIRATORY_TRACT
  Filled 2015-09-08: qty 5

## 2015-09-08 MED ORDER — INSULIN ASPART 100 UNIT/ML ~~LOC~~ SOLN
0.0000 [IU] | Freq: Every day | SUBCUTANEOUS | Status: DC
Start: 2015-09-08 — End: 2015-09-10

## 2015-09-08 MED ORDER — MOMETASONE FURO-FORMOTEROL FUM 100-5 MCG/ACT IN AERO
2.0000 | INHALATION_SPRAY | Freq: Two times a day (BID) | RESPIRATORY_TRACT | Status: DC
  Administered 2015-09-09 – 2015-09-10 (×3): 2 via RESPIRATORY_TRACT
  Filled 2015-09-08: qty 8.8

## 2015-09-08 NOTE — Progress Notes (Addendum)
PROGRESS NOTE    Ashley Houston  ZOX:[id number] DOB: 06/05/1940 DOA: 09/07/2015 PCP: Lenise HeraldMANN, BENJAMIN, PA-C  Outpatient Specialists:     Brief Narrative:  Patient is a [age] old woman with a history of COPD, hypothyroidism, chronic diastolic heart failure, and depression, who was admitted on 09/07/2015 with a chief complaint of shortness of breath. The patient was recently hospitalized and discharged on 08/18/2015 for treatment of CAP. During the previous hospitalization, she was found to be hypoxic and was discharged on home O2. She was also discharged on Levaquin. She was also evaluated by her PCP last week. Outpatient chest x-ray revealed right lobe pneumonia. She was started on another round of Levaquin. She did not improve. In the ED, she was afebrile and hemodynamically stable. With ambulation in the ED, her oxygen saturation fell to 85% on 2 L nasal cannula oxygen. Her chest x-ray was unchanged with the opacity in the right mid lung zone, likely anterior right upper lobe, concerning for pneumonia; no progression or change over the past 4 days. She was admitted for further evaluation and management.   Assessment & Plan:   Principal Problem:   COPD exacerbation (HCC) Active Problems:   CAP (community acquired pneumonia)   COPD (chronic obstructive pulmonary disease) with emphysema (HCC)   Acute respiratory failure with hypoxia (HCC)   Tobacco abuse   Generalized weakness   Chronic diastolic heart failure (HCC)   Hypothyroidism   Hyperglycemia, drug-induced   1. COPD with exacerbation causing acute on chronic respiratory failure with hypoxia. The patient was just discharged 2 weeks ago for treatment of pneumonia with hypoxia. She was discharged on home oxygen for the first time 2 weeks ago. Her oxygen saturation was 85% on 2 L nasal cannula oxygen with ambulation in the ED. She was wheezing. -She was started on albuterol nebulizer and IV Solu-Medrol. Broad-spectrum antibiotics were started  with cefepime and vancomycin, in part, for treatment of persistent CAP. -She was strongly advised to stop smoking. -We'll add Spiriva and Dulera.  CAP. The patient was treated 2 weeks ago for CAP. The chest x-ray revealed unchanged right mid lobe opacity. -Doubt that this is HCAP, but rather residual radiographic evidence of pneumonia she had 2 weeks ago. She was neither febrile nor was her white blood cell count elevated on admission. -Cefepime and vancomycin were started on admission. We'll continue it for another 24-48 hours before quickly narrowing antibiotic treatment.  Chronic diastolic heart failure.  Patient's echo on 08/15/2015 revealed an EF of 55-60% and grade 2 diastolic dysfunction. Her heart failure appears to be compensated currently.  Steroid-induced hyperglycemia. Patient was started on sliding-scale NovoLog for elevated CBGs on IV Solu-Medrol.  Hypothyroidism. Synthroid was restarted. Her TSH was within normal limits on 08/15/15.  DVT prophylaxis: Lovenox Code Status: Full code Family Communication: Family not available Disposition Plan: Discharged home in clinically appropriate, likely in the next 24-48 hours.   Consultants:   None  Procedures:  None  Antimicrobials:  Cefepime 09/07/15>>  Vancomycin 09/07/15 >>   Subjective: Patient says that she is breathing better.  Objective: Filed Vitals:   09/08/15 [id number] 09/08/15 0705 09/08/15 1236 09/08/15 1451  BP:  114/65  122/56  Pulse:  76  86  Temp:  98 F (36.7 C)  97.8 F (36.6 C)  TempSrc:  Oral  Oral  Resp:  15  18  Height:      Weight:      SpO2: 94% 93% 98% 96%    Intake/Output Summary (Last  24 hours) at 09/08/15 1536 Last data filed at 09/08/15 1216  Gross per 24 hour  Intake    480 ml  Output    150 ml  Net    330 ml   Filed Weights   09/07/15 1328 09/07/15 1850  Weight: 55.792 kg (123 lb) 55.792 kg (123 lb)    Examination:  General exam: Appears calm and comfortable  Respiratory  system: Rare wheezes and crackles on the right greater than left. Respiratory effort normal. Cardiovascular system: S1 & S2 heard with soft systolic murmur. No JVD, murmurs, rubs, gallops or clicks. No pedal edema. Gastrointestinal system: Abdomen is nondistended, soft and nontender. No organomegaly or masses felt. Normal bowel sounds heard. Central nervous system: Alert and oriented. No focal neurological deficits. Extremities: Symmetric 5 x 5 power. Skin: No rashes, lesions or ulcers Psychiatry: Judgement and insight appear normal. Mood & affect appropriate.     Data Reviewed: I have personally reviewed following labs and imaging studies  CBC:  Recent Labs Lab 09/07/15 1350 09/08/15 0624  WBC 7.4 6.7  NEUTROABS 5.0  --   HGB 10.6* 10.9*  HCT 32.9* 34.5*  MCV 84.8 84.4  PLT 302 407*   Basic Metabolic Panel:  Recent Labs Lab 09/07/15 1350 09/08/15 0624  NA 138 141  K 4.7 3.9  CL 101 105  CO2 27 25  GLUCOSE 97 145*  BUN 15 13  CREATININE 0.85 0.62  CALCIUM 9.3 9.5   GFR: Estimated Creatinine Clearance: 55.2 mL/min (by C-G formula based on Cr of 0.62). Liver Function Tests:  Recent Labs Lab 09/07/15 1350  AST 18  ALT 12*  ALKPHOS 79  BILITOT 0.5  PROT 7.4  ALBUMIN 3.1*   No results for input(s): LIPASE, AMYLASE in the last 168 hours. No results for input(s): AMMONIA in the last 168 hours. Coagulation Profile: No results for input(s): INR, PROTIME in the last 168 hours. Cardiac Enzymes:  Recent Labs Lab 09/07/15 1350  TROPONINI <0.03   BNP (last 3 results) No results for input(s): PROBNP in the last 8760 hours. HbA1C: No results for input(s): HGBA1C in the last 72 hours. CBG:  Recent Labs Lab 09/08/15 0845 09/08/15 1130  GLUCAP 154* 124*   Lipid Profile: No results for input(s): CHOL, HDL, LDLCALC, TRIG, CHOLHDL, LDLDIRECT in the last 72 hours. Thyroid Function Tests: No results for input(s): TSH, T4TOTAL, FREET4, T3FREE, THYROIDAB in the  last 72 hours. Anemia Panel: No results for input(s): VITAMINB12, FOLATE, FERRITIN, TIBC, IRON, RETICCTPCT in the last 72 hours. Urine analysis:    Component Value Date/Time   COLORURINE YELLOW 09/08/2015 0605   APPEARANCEUR CLEAR 09/08/2015 0605   LABSPEC 1.010 09/08/2015 0605   PHURINE 7.0 09/08/2015 0605   GLUCOSEU NEGATIVE 09/08/2015 0605   HGBUR NEGATIVE 09/08/2015 0605   BILIRUBINUR NEGATIVE 09/08/2015 0605   KETONESUR NEGATIVE 09/08/2015 0605   PROTEINUR NEGATIVE 09/08/2015 0605   NITRITE NEGATIVE 09/08/2015 0605   LEUKOCYTESUR NEGATIVE 09/08/2015 0605   Sepsis Labs: (procalcitonin:4,lacticidven:4)  ) Recent Results (from the past 240 hour(s))  Culture, blood (routine x 2) Call MD if unable to obtain prior to antibiotics being given     Status: None (Preliminary result)   Collection Time: 09/07/15  6:37 PM  Result Value Ref Range Status   Specimen Description BLOOD LEFT ARM  Final   Special Requests BOTTLES DRAWN AEROBIC AND ANAEROBIC 6CC  Final   Culture NO GROWTH < 12 HOURS  Final   Report Status PENDING  Incomplete  Culture, blood (routine x 2) Call MD if unable to obtain prior to antibiotics being given     Status: None (Preliminary result)   Collection Time: 09/07/15  6:41 PM  Result Value Ref Range Status   Specimen Description BLOOD LEFT HAND  Final   Special Requests BOTTLES DRAWN AEROBIC ONLY 6CC  Final   Culture NO GROWTH < 12 HOURS  Final   Report Status PENDING  Incomplete         Radiology Studies: Dg Chest 2 View  09/07/2015  CLINICAL DATA:  Pneumonia. Intermittent weakness and dizziness for 3 weeks. Symptoms worse today. EXAM: CHEST  2 VIEW COMPARISON:  Radiographs 09/03/2015 FINDINGS: Small focal right mid lung zone opacity is unchanged from recent prior. Background emphysema wood lower lung volumes from prior. Bibasilar subsegmental atelectasis, left greater than right. Heart size and mediastinal contours are unchanged allowing for  rotation. No pleural effusion. No pneumothorax. IMPRESSION: Unchanged opacity in the right mid lung zone, likely anterior right upper lobe, concerning for pneumonia. No progression or change over the past 4 days. Background emphysema.  Subsegmental atelectasis at the lung bases. Electronically Signed   By: Rubye Oaks M.D.   On: 09/07/2015 14:44        Scheduled Meds: . albuterol  2.5 mg Nebulization TID  . aspirin  81 mg Oral Daily  . ceFEPime (MAXIPIME) IV  1 g Intravenous Q8H  . doxepin  100 mg Oral QHS  . enoxaparin (LOVENOX) injection  40 mg Subcutaneous Q24H  . gabapentin  300 mg Oral TID  . insulin aspart  0-20 Units Subcutaneous TID WC  . insulin aspart  0-5 Units Subcutaneous QHS  . levothyroxine  25 mcg Oral QAC breakfast  . methylPREDNISolone (SOLU-MEDROL) injection  60 mg Intravenous Q12H  . metoprolol succinate  25 mg Oral Daily  . tiotropium  18 mcg Inhalation Daily  . vancomycin  500 mg Intravenous Q12H   Continuous Infusions:    LOS: 1 day    Time spent: 30 minutes    Elliot Cousin, MD [facility] Pager [phone]  If 7PM-7AM, please contact night-coverage [email or web address] Password [facility] 09/08/2015, 3:36 PM

## 2015-09-09 DIAGNOSIS — D649 Anemia, unspecified: Secondary | ICD-10-CM | POA: Diagnosis present

## 2015-09-09 LAB — GLUCOSE, CAPILLARY
Glucose-Capillary: 155 mg/dL — ABNORMAL HIGH (ref 65–99)
Glucose-Capillary: 106 mg/dL — ABNORMAL HIGH (ref 65–99)
Glucose-Capillary: 126 mg/dL — ABNORMAL HIGH (ref 65–99)

## 2015-09-09 LAB — IRON AND TIBC
Iron: 29 ug/dL (ref 28–170)
Saturation Ratios: 8 % — ABNORMAL LOW (ref 10.4–31.8)
TIBC: 363 ug/dL (ref 250–450)
UIBC: 334 ug/dL

## 2015-09-09 LAB — EXPECTORATED SPUTUM ASSESSMENT W REFEX TO RESP CULTURE

## 2015-09-09 LAB — EXPECTORATED SPUTUM ASSESSMENT W GRAM STAIN, RFLX TO RESP C

## 2015-09-09 LAB — FERRITIN: Ferritin: 82 ng/mL (ref 11–307)

## 2015-09-09 LAB — RETICULOCYTES
RBC.: 3.83 MIL/uL — AB (ref 3.87–5.11)
RETIC COUNT ABSOLUTE: 38.3 10*3/uL (ref 19.0–186.0)
RETIC CT PCT: 1 % (ref 0.4–3.1)

## 2015-09-09 LAB — VITAMIN B12: VITAMIN B 12: 921 pg/mL — AB (ref 180–914)

## 2015-09-09 NOTE — Progress Notes (Signed)
Medicated for back pain

## 2015-09-09 NOTE — Progress Notes (Addendum)
PROGRESS NOTE    Ashley Houston  ZOX:[id number] DOB: 03/05/1941 DOA: 09/07/2015 PCP: Lenise HeraldMANN, BENJAMIN, PA-C  Outpatient Specialists:     Brief Narrative:  Patient is a [age] old woman with a history of COPD, hypothyroidism, chronic diastolic heart failure, and depression, who was admitted on 09/07/2015 with a chief complaint of shortness of breath. The patient was recently hospitalized and discharged on 08/18/2015 for treatment of CAP. During the previous hospitalization, she was found to be hypoxic and was discharged on home O2. She was also discharged on Levaquin. She was also evaluated by her PCP last week. Outpatient chest x-ray revealed right lobe pneumonia. She was started on another round of Levaquin. She did not improve. In the ED, she was afebrile and hemodynamically stable. With ambulation in the ED, her oxygen saturation fell to 85% on 2 L nasal cannula oxygen. Her chest x-ray was unchanged with the opacity in the right mid lung zone, likely anterior right upper lobe, concerning for pneumonia; no progression or change over the past 4 days. She was admitted for further evaluation and management.   Assessment & Plan:   Principal Problem:   COPD exacerbation (HCC) Active Problems:   CAP (community acquired pneumonia)   COPD (chronic obstructive pulmonary disease) with emphysema (HCC)   Acute respiratory failure with hypoxia (HCC)   Tobacco abuse   Generalized weakness   Chronic diastolic heart failure (HCC)   Hypothyroidism   Hyperglycemia, drug-induced   1. COPD with exacerbation causing acute on chronic respiratory failure with hypoxia. The patient was just discharged 2 weeks ago for treatment of pneumonia with hypoxia. She was discharged on home oxygen for the first time 2 weeks ago. Her oxygen saturation was 85% on 2 L nasal cannula oxygen with ambulation in the ED. She was wheezing. -She was started on albuterol nebulizer and IV Solu-Medrol. Broad-spectrum antibiotics were started  with cefepime and vancomycin, in part, for treatment of persistent CAP. Spiriva and Dulera inhalers were added. -She was strongly advised to stop smoking. -She has less wheezing and is improving clinically.  CAP. The patient was treated 2 weeks ago for CAP. The chest x-ray revealed unchanged right mid lobe opacity. -Doubt that this is HCAP, but rather residual radiographic evidence of pneumonia she had 2 weeks ago. She was neither febrile nor was her white blood cell count elevated on admission. -Blood cultures and sputum culture are negative to date. -Cefepime and vancomycin were started on admission. We'll continue it for another 24-48 hours before quickly narrowing antibiotic treatment.  Chronic diastolic heart failure.  Patient's echo on 08/15/2015 revealed an EF of 55-60% and grade 2 diastolic dysfunction. Her heart failure appears to be compensated currently.  Normocytic anemia. Patient has chronic normocytic anemia. Anemia panel has been ordered for further evaluation.  Steroid-induced hyperglycemia. Patient was started on sliding-scale NovoLog for elevated CBGs on IV Solu-Medrol. Her CBGs have been relatively controlled.  Hypothyroidism. Synthroid was restarted. Her TSH was within normal limits on 08/15/15.  DVT prophylaxis: Lovenox Code Status: Full code Family Communication: Family not available Disposition Plan: Discharged home in clinically appropriate, likely in the next 24 hours.   Consultants:   None  Procedures:  None  Antimicrobials:  Cefepime 09/07/15>>  Vancomycin 09/07/15 >>   Subjective: Patient says that she is breathing better, but she wants to try to walk.  Objective: Filed Vitals:   09/09/15 0723 09/09/15 0926 09/09/15 1338 09/09/15 1445  BP:    123/62  Pulse:    79  Temp:    98.5 F (36.9 C)  TempSrc:    Oral  Resp:    18  Height:      Weight:      SpO2: 100% 98% 94%     Intake/Output Summary (Last 24 hours) at 09/09/15 1635 Last data  filed at 09/09/15 1200  Gross per 24 hour  Intake    720 ml  Output      0 ml  Net    720 ml   Filed Weights   09/07/15 1328 09/07/15 1850  Weight: 55.792 kg (123 lb) 55.792 kg (123 lb)    Examination:  General exam: Appears calm and comfortable  Respiratory system: Near resolution of wheezes and crackles. Respiratory effort normal. Cardiovascular system: S1 & S2 heard with soft systolic murmur. No JVD, murmurs, rubs, gallops or clicks. No pedal edema. Gastrointestinal system: Abdomen is nondistended, soft and nontender. No organomegaly or masses felt. Normal bowel sounds heard. Central nervous system: Alert and oriented. No focal neurological deficits. Extremities: Symmetric 5 x 5 power. Skin: No rashes, lesions or ulcers Psychiatry: Judgement and insight appear normal. Mood & affect appropriate.     Data Reviewed: I have personally reviewed following labs and imaging studies  CBC:  Recent Labs Lab 09/07/15 1350 09/08/15 0624  WBC 7.4 6.7  NEUTROABS 5.0  --   HGB 10.6* 10.9*  HCT 32.9* 34.5*  MCV 84.8 84.4  PLT 302 407*   Basic Metabolic Panel:  Recent Labs Lab 09/07/15 1350 09/08/15 0624  NA 138 141  K 4.7 3.9  CL 101 105  CO2 27 25  GLUCOSE 97 145*  BUN 15 13  CREATININE 0.85 0.62  CALCIUM 9.3 9.5   GFR: Estimated Creatinine Clearance: 55.2 mL/min (by C-G formula based on Cr of 0.62). Liver Function Tests:  Recent Labs Lab 09/07/15 1350  AST 18  ALT 12*  ALKPHOS 79  BILITOT 0.5  PROT 7.4  ALBUMIN 3.1*   No results for input(s): LIPASE, AMYLASE in the last 168 hours. No results for input(s): AMMONIA in the last 168 hours. Coagulation Profile: No results for input(s): INR, PROTIME in the last 168 hours. Cardiac Enzymes:  Recent Labs Lab 09/07/15 1350  TROPONINI <0.03   BNP (last 3 results) No results for input(s): PROBNP in the last 8760 hours. HbA1C: No results for input(s): HGBA1C in the last 72 hours. CBG:  Recent Labs Lab  09/08/15 0845 09/08/15 1130 09/08/15 1701 09/09/15 0824 09/09/15 1114  GLUCAP 154* 124* 140* 155* 106*   Lipid Profile: No results for input(s): CHOL, HDL, LDLCALC, TRIG, CHOLHDL, LDLDIRECT in the last 72 hours. Thyroid Function Tests: No results for input(s): TSH, T4TOTAL, FREET4, T3FREE, THYROIDAB in the last 72 hours. Anemia Panel:  Recent Labs  09/09/15 0937  RETICCTPCT 1.0   Urine analysis:    Component Value Date/Time   COLORURINE YELLOW 09/08/2015 0605   APPEARANCEUR CLEAR 09/08/2015 0605   LABSPEC 1.010 09/08/2015 0605   PHURINE 7.0 09/08/2015 0605   GLUCOSEU NEGATIVE 09/08/2015 0605   HGBUR NEGATIVE 09/08/2015 0605   BILIRUBINUR NEGATIVE 09/08/2015 0605   KETONESUR NEGATIVE 09/08/2015 0605   PROTEINUR NEGATIVE 09/08/2015 0605   NITRITE NEGATIVE 09/08/2015 0605   LEUKOCYTESUR NEGATIVE 09/08/2015 0605   Sepsis Labs: (procalcitonin:4,lacticidven:4)  ) Recent Results (from the past 240 hour(s))  Culture, blood (routine x 2) Call MD if unable to obtain prior to antibiotics being given     Status: None (Preliminary result)   Collection Time: 09/07/15  6:37 PM  Result Value Ref Range Status   Specimen Description BLOOD LEFT ARM  Final   Special Requests BOTTLES DRAWN AEROBIC AND ANAEROBIC 6CC  Final   Culture NO GROWTH 2 DAYS  Final   Report Status PENDING  Incomplete  Culture, blood (routine x 2) Call MD if unable to obtain prior to antibiotics being given     Status: None (Preliminary result)   Collection Time: 09/07/15  6:41 PM  Result Value Ref Range Status   Specimen Description BLOOD LEFT HAND  Final   Special Requests BOTTLES DRAWN AEROBIC ONLY 6CC  Final   Culture NO GROWTH 2 DAYS  Final   Report Status PENDING  Incomplete  Culture, sputum-assessment     Status: None   Collection Time: 09/09/15  8:30 AM  Result Value Ref Range Status   Specimen Description SPUTUM  Final   Special Requests NONE  Final   Sputum evaluation   Final    THIS  SPECIMEN IS ACCEPTABLE. RESPIRATORY CULTURE REPORT TO FOLLOW.   Report Status 09/09/2015 FINAL  Final         Radiology Studies: No results found.      Scheduled Meds: . albuterol  2.5 mg Nebulization TID  . aspirin  81 mg Oral Daily  . ceFEPime (MAXIPIME) IV  1 g Intravenous Q8H  . doxepin  100 mg Oral QHS  . enoxaparin (LOVENOX) injection  40 mg Subcutaneous Q24H  . gabapentin  300 mg Oral TID  . insulin aspart  0-15 Units Subcutaneous TID WC  . insulin aspart  0-5 Units Subcutaneous QHS  . levothyroxine  25 mcg Oral QAC breakfast  . methylPREDNISolone (SOLU-MEDROL) injection  60 mg Intravenous Q12H  . metoprolol succinate  25 mg Oral Daily  . mometasone-formoterol  2 puff Inhalation BID  . tiotropium  18 mcg Inhalation Daily  . vancomycin  500 mg Intravenous Q12H   Continuous Infusions:    LOS: 2 days    Time spent: 30 minutes    Elliot Cousin, MD [facility] Pager [phone]  If 7PM-7AM, please contact night-coverage [email or web address] Password [facility] 09/09/2015, 4:35 PM

## 2015-09-10 ENCOUNTER — Other Ambulatory Visit: Payer: Self-pay | Admitting: *Deleted

## 2015-09-10 DIAGNOSIS — J441 Chronic obstructive pulmonary disease with (acute) exacerbation: Secondary | ICD-10-CM

## 2015-09-10 DIAGNOSIS — J189 Pneumonia, unspecified organism: Secondary | ICD-10-CM

## 2015-09-10 DIAGNOSIS — D649 Anemia, unspecified: Secondary | ICD-10-CM

## 2015-09-10 DIAGNOSIS — I5032 Chronic diastolic (congestive) heart failure: Secondary | ICD-10-CM

## 2015-09-10 LAB — BASIC METABOLIC PANEL
ANION GAP: 8 (ref 5–15)
BUN: 24 mg/dL — ABNORMAL HIGH (ref 6–20)
CALCIUM: 9 mg/dL (ref 8.9–10.3)
CO2: 29 mmol/L (ref 22–32)
Chloride: 107 mmol/L (ref 101–111)
Creatinine, Ser: 0.5 mg/dL (ref 0.44–1.00)
Glucose, Bld: 125 mg/dL — ABNORMAL HIGH (ref 65–99)
Potassium: 4.1 mmol/L (ref 3.5–5.1)
SODIUM: 144 mmol/L (ref 135–145)

## 2015-09-10 LAB — FOLATE: Folate: 22.3 ng/mL (ref >5.9)

## 2015-09-10 LAB — CBC
HCT: 34.2 % — ABNORMAL LOW (ref 36.0–46.0)
Hemoglobin: 10.7 g/dL — ABNORMAL LOW (ref 12.0–15.0)
MCH: 26.4 pg (ref 26.0–34.0)
MCHC: 31.3 g/dL (ref 30.0–36.0)
MCV: 84.4 fL (ref 78.0–100.0)
PLATELETS: 365 10*3/uL (ref 150–400)
RBC: 4.05 MIL/uL (ref 3.87–5.11)
RDW: 16.6 % — AB (ref 11.5–15.5)
WBC: 10.2 10*3/uL (ref 4.0–10.5)

## 2015-09-10 LAB — GLUCOSE, CAPILLARY
GLUCOSE-CAPILLARY: 149 mg/dL — AB (ref 65–99)
Glucose-Capillary: 131 mg/dL — ABNORMAL HIGH (ref 65–99)
Glucose-Capillary: 98 mg/dL (ref 65–99)

## 2015-09-10 MED ORDER — TIOTROPIUM BROMIDE MONOHYDRATE 18 MCG IN CAPS: 18.0000 ug | ORAL_CAPSULE | Freq: Every day | RESPIRATORY_TRACT | Status: DC

## 2015-09-10 MED ORDER — MOMETASONE FURO-FORMOTEROL FUM 100-5 MCG/ACT IN AERO: 2.0000 | INHALATION_SPRAY | Freq: Two times a day (BID) | RESPIRATORY_TRACT | Status: DC

## 2015-09-10 MED ORDER — CEFUROXIME AXETIL 500 MG PO TABS: 500.0000 mg | ORAL_TABLET | Freq: Two times a day (BID) | ORAL | Status: DC

## 2015-09-10 MED ORDER — PREDNISONE 10 MG PO TABS: ORAL_TABLET | ORAL | Status: DC

## 2015-09-10 NOTE — Evaluation (Signed)
Physical Therapy Evaluation Patient Details Name: Ashley Houston MRN: [id number] DOB: 05/02/1941 Today's Date: 09/10/2015   History of Present Illness  75 y.o. female with a history of COPD, tachycardia, chronic pain, thyroid disease, and depression. Patient was recently hospitalized at the beginning of this month for pneumonia. The patient was discharged to home after 3 days to finish her prescription of levofloxacin. She was discharged on home O2. Patient had persistent mild dyspnea that worsened approximately week ago. The patient saw her outpatient doctor beginning of the week and was prescribed levofloxacin again. Patient also started her home O2 again, as she had discontinued this when her antibiotics ran out. As her symptoms were not improving, particularly her shortness of breath, wheezing, and generalized weakness, the patient presented to the hospital for evaluation. On arrival, the patient was hypoxic. She was placed on oxygen with improvement. Patient also states that she's been doing her nebulizer treatments at home, which improves her shortness of breath briefly. Ambulation and activity make her dyspnea worse. Additionally, she has had a mildly productive cough.  Dx: CAP.   Clinical Impression  Pt received sitting up on the EOB, and was agreeable to PT evaluation.  Pt demonstrates bed level mobility, and sit<>stand transfers at Mod (I) level, as well as gait independently x 55000ft, and was able to negotiate 4 steps at Mod (I) level.  Pt's SpO2 briefly dropped to 89% at the very end of gait for just a few seconds, and was immediately improved >90% with PLB.  At this point, pt does not demonstrate need for skilled PT, therefore will sign off.      Follow Up Recommendations No PT follow up    Equipment Recommendations  3in1 (PT)    Recommendations for Other Services       Precautions / Restrictions Precautions Precautions: None Restrictions Weight Bearing Restrictions: No       Mobility  Bed Mobility Overal bed mobility: Modified Independent             General bed mobility comments: Reviewed log roll technique with pt due to pt reporting having occasional back pain.   Transfers Overall transfer level: Modified independent Equipment used: None                Ambulation/Gait Ambulation/Gait assistance: Independent Ambulation Distance (Feet): 500 Feet Assistive device: None Gait Pattern/deviations: WFL(Within Functional Limits)        Stairs Stairs: Yes Stairs assistance: Modified independent (Device/Increase time) Stair Management: One rail Right Number of Stairs: 4 General stair comments: step over step  Wheelchair Mobility    Modified Rankin (Stroke Patients Only)       Balance Overall balance assessment: Independent                                           Pertinent Vitals/Pain Pain Assessment: No/denies pain    Home Living Family/patient expects to be discharged to:: Private residence Living Arrangements: Children (Dtr works during the day, son in Social workerlaw is a Presenter, broadcastinglines-man so is gone for a week at a time. ) Available Help at Discharge: Family (Has a few dtr's in the area that are planning to stay with her this week. ) Type of Home: House Home Access: Level entry     Home Layout: Multi-level Home Equipment: Gilmer Mor[employer]ane - single point      Prior Function Level of Independence: Independent  Hand Dominance        Extremity/Trunk Assessment   Upper Extremity Assessment: Overall WFL for tasks assessed           Lower Extremity Assessment: Overall WFL for tasks assessed         Communication   Communication: No difficulties  Cognition Arousal/Alertness: Awake/alert Behavior During Therapy: WFL for tasks assessed/performed Overall Cognitive Status: Within Functional Limits for tasks assessed                      General Comments General comments (skin integrity,  edema, etc.): Pt demonstrates B UE tremors    Exercises        Assessment/Plan    PT Assessment Patent does not need any further PT services  PT Diagnosis Generalized weakness   PT Problem List    PT Treatment Interventions     PT Goals (Current goals can be found in the Care Plan section) Acute Rehab PT Goals Patient Stated Goal: To go home. PT Goal Formulation: With patient Time For Goal Achievement: 09/10/15 Potential to Achieve Goals: Good    Frequency     Barriers to discharge        Co-evaluation               End of Session Equipment Utilized During Treatment: Gait belt;Oxygen Activity Tolerance: Patient tolerated treatment well Patient left: in bed;with call bell/phone within reach Nurse Communication: Mobility status    Functional Assessment Tool Used: Clinical Judgement Functional Limitation: Mobility: Walking and moving around Mobility: Walking and Moving Around Current Status [phone]): 0 percent impaired, limited or restricted Mobility: Walking and Moving Around Goal Status [phone]): 0 percent impaired, limited or restricted Mobility: Walking and Moving Around Discharge Status [phone]): 0 percent impaired, limited or restricted    Time: 0915-0930 PT Time Calculation (min) (ACUTE ONLY): 15 min   Charges:   PT Evaluation $PT Eval Low Complexity: 1 Procedure     PT G Codes:   PT G-Codes **NOT FOR INPATIENT CLASS** Functional Assessment Tool Used: Clinical Judgement Functional Limitation: Mobility: Walking and moving around Mobility: Walking and Moving Around Current Status ([id number]): 0 percent impaired, limited or restricted Mobility: Walking and Moving Around Goal Status ([id number]): 0 percent impaired, limited or restricted Mobility: Walking and Moving Around Discharge Status (X3244): 0 percent impaired, limited or restricted    Beth Candice Lunney, PT, DPT X: [id number]   09/10/2015, 10:16 AM

## 2015-09-10 NOTE — Discharge Summary (Signed)
Physician Discharge Summary  Ashley Houston ZOX:[id number] DOB: [birth year] DOA: 09/07/2015  PCP: Ashley Herald, PA-C  Admit date: 09/07/2015 Discharge date: 09/10/2015  Time spent: Greater than 30 minutes  Recommendations for Outpatient Follow-up:  1. Recommend follow-up of the patient's ongoing anemia. Consider referral to gastroenterology or hematology.    Discharge Diagnoses:  1. COPD with exacerbation causing acute on chronic respiratory failure with hypoxia. 2. Residual community-acquired pneumonia. 3. Chronic diastolic heart failure. 4. Tobacco abuse. Patient was encouraged to stop smoking. 5. Steroid-induced hyperglycemia. 6. Normocytic anemia. 7. Hypothyroidism.  Discharge Condition: Improved.  Diet recommendation: Heart healthy.  Filed Weights   09/07/15 1328 09/07/15 1850  Weight: 55.792 kg (123 lb) 55.792 kg (123 lb)    History of present illness:  Patient is a 75 old woman with a history of COPD, hypothyroidism, chronic diastolic heart failure, and depression, who was admitted on 09/07/2015 with a chief complaint of shortness of breath. She was recently hospitalized and discharged on 08/18/2015 for treatment of CAP and diastolic heart failure.. During the previous hospitalization, she was found to be hypoxic and was discharged on home O2. She was also discharged on Levaquin. She was also evaluated by her PCP last week. Outpatient chest x-ray revealed right lobe pneumonia. She was started on another round of Levaquin. She did not improve. In the ED, she was afebrile and hemodynamically stable. With ambulation in the ED, her oxygen saturation fell to 85% on 2 L nasal cannula oxygen. Her chest x-ray was unchanged with the opacity in the right mid lung zone, likely anterior right upper lobe, concerning for pneumonia; no progression or change over the past 4 days. She was admitted for further evaluation and management.  Hospital Course:  1. COPD with exacerbation causing acute on  chronic respiratory failure with hypoxia. The patient was just discharged 2 weeks ago for treatment of pneumonia with hypoxia. She was discharged on home oxygen 2 weeks ago. Her oxygen saturation was 85% on 2 L nasal cannula oxygen with ambulation in the ED. She was wheezing. -She was started on albuterol nebulizer. IV Solu-Medrol was started. Broad-spectrum antibiotics were started with cefepime and vancomycin, in part, for treatment of persistent CAP versus HCAP. Spiriva and Dulera were added. She was advised to stop smoking. Over the course of the hospitalization, she improved clinically and symptomatically. Her oxygen saturation improved to the 90s with ambulation on 2 L nasal cannula oxygen. She was discharged on a prednisone taper, added Spiriva and Dulera inhaler, ongoing albuterol inhaler, and a few more days of Ceftin.  CAP. The patient was treated 2 weeks ago for CAP. The chest x-ray revealed unchanged right mid lobe opacity from an outpatient chest x-ray done 4 days prior to admission. -It was doubted that the infiltrate represented HCAP, but rather residual radiographic evidence of pneumonia she had 2 weeks ago. She was neither febrile nor was her white blood cell count elevated on admission. -Blood cultures and sputum culture were negative to date. She improved clinically and symptomatically. -Cefepime and vancomycin were started on admission. She was discharged on Ceftin, in part, due to the COPD exacerbation.  Chronic diastolic heart failure.  Patient's echo on 08/15/2015 revealed an EF of 55-60% and grade 2 diastolic dysfunction. Her heart failure appeared to have been compensated.  Normocytic anemia. Patient has chronic normocytic anemia per review of her labs. Anemia panel revealed a normal total iron of 29, TIBC of 363, ferritin of 82, folate of 22.3, vitamin B12 of 921. Her hemoglobin  was 10.7 at the time of discharge. Recommend outpatient evaluation of her anemia. This will be  deferred to her PCP.  Steroid-induced hyperglycemia. Patient was started on sliding-scale NovoLog for elevated CBGs on IV Solu-Medrol. Her CBGs were relatively controlled.  Hypothyroidism. Synthroid was restarted. Her TSH was within normal limits on 08/15/15.    Procedures:  None  Consultations:  None  Discharge Exam: Filed Vitals:   09/10/15 0618 09/10/15 0938  BP: 115/62   Pulse: 71 82  Temp: 97.4 F (36.3 C)   Resp: 15   Oxygen saturation 96% on 2 L of oxygen.  General: 75 year old Caucasian woman in no acute distress. Overall, she looks better. Cardiovascular: S1, S2, with a soft systolic murmur. Respiratory: Coarse breath sounds, but no audible wheezes or crackles; breathing nonlabored.  Discharge Instructions   Discharge Instructions    Diet - low sodium heart healthy    Complete by:  As directed      Discharge instructions    Complete by:  As directed   Taking medications and inhalers as prescribed     Increase activity slowly    Complete by:  As directed           Current Discharge Medication List    START taking these medications   Details  cefUROXime (CEFTIN) 500 MG tablet Take 1 tablet (500 mg total) by mouth 2 (two) times daily with a meal. Starting 09/11/15 take as prescribed until completed. Qty: 8 tablet, Refills: 0    mometasone-formoterol (DULERA) 100-5 MCG/ACT AERO Inhale 2 puffs into the lungs 2 (two) times daily. Qty: 1 g, Refills: 2    tiotropium (SPIRIVA) 18 MCG inhalation capsule Place 1 capsule (18 mcg total) into inhaler and inhale daily. Qty: 30 capsule, Refills: 12      CONTINUE these medications which have CHANGED   Details  predniSONE (DELTASONE) 10 MG tablet Starting tomorrow, take 5 tablets daily for 1 day; then 4 tablets the next day; then 3 tablets the next day; then 2 tablets the next day; and then 1 tablet the next day; then stop. Qty: 15 tablet, Refills: 0      CONTINUE these medications which have NOT CHANGED    Details  albuterol (PROVENTIL) (2.5 MG/3ML) 0.083% nebulizer solution Take 3 mLs by nebulization daily as needed. Refills: 11    aspirin 81 MG tablet Take 81 mg by mouth daily.    CALCIUM PO Take 1 tablet by mouth daily.    doxepin (SINEQUAN) 100 MG capsule Take 1 capsule by mouth at bedtime.    gabapentin (NEURONTIN) 300 MG capsule Take 300 mg by mouth 3 (three) times daily.    HYDROcodone-acetaminophen (NORCO) 10-325 MG tablet Take 1 tablet by mouth every 4 (four) hours as needed for moderate pain.     levothyroxine (SYNTHROID, LEVOTHROID) 25 MCG tablet Take 25 mcg by mouth daily before breakfast.    LORazepam (ATIVAN) 2 MG tablet Take 2 mg by mouth every 6 (six) hours as needed for anxiety.    metoprolol succinate (TOPROL-XL) 25 MG 24 hr tablet       STOP taking these medications     levofloxacin (LEVAQUIN) 750 MG tablet      metoprolol tartrate (LOPRESSOR) 25 MG tablet        Allergies  Allergen Reactions  . Lipitor [Atorvastatin] Itching   Follow-up Information    Follow up with MANN, BENJAMIN, PA-C. Schedule an appointment as soon as possible for a visit in 1 week.  Specialties:  Physician Assistant, Internal Medicine   Contact information:   [address] [place] [address] [phone]        The results of significant diagnostics from this hospitalization (including imaging, microbiology, ancillary and laboratory) are listed below for reference.    Significant Diagnostic Studies: Dg Chest 2 View  09/07/2015  CLINICAL DATA:  Pneumonia. Intermittent weakness and dizziness for 3 weeks. Symptoms worse today. EXAM: CHEST  2 VIEW COMPARISON:  Radiographs 09/03/2015 FINDINGS: Small focal right mid lung zone opacity is unchanged from recent prior. Background emphysema wood lower lung volumes from prior. Bibasilar subsegmental atelectasis, left greater than right. Heart size and mediastinal contours are unchanged allowing for rotation. No pleural effusion.  No pneumothorax. IMPRESSION: Unchanged opacity in the right mid lung zone, likely anterior right upper lobe, concerning for pneumonia. No progression or change over the past 4 days. Background emphysema.  Subsegmental atelectasis at the lung bases. Electronically Signed   By: Rubye Oaks M.D.   On: 09/07/2015 14:44   Dg Chest 2 View  09/03/2015  CLINICAL DATA:  Recent pna. Sharp pains on both sides of her back. EXAM: CHEST - 2 VIEW COMPARISON:  08/15/2015 and previous FINDINGS: Lungs are hyperinflated with coarse attenuated bronchovascular markings. Focal airspace infiltrate laterally in the right mid lung. Left lung clear. Heart size normal. No effusion. No pneumothorax. Minimal spurring in the mid thoracic spine. IMPRESSION: 1. Hyperinflation with focal right mid lung airspace infiltrate, new since previous study, suggesting early subsegmental pneumonia. Electronically Signed   By: Corlis Leak M.D.   On: 09/03/2015 16:14   Dg Chest 2 View  08/15/2015  CLINICAL DATA:  Sudden onset sharp right-sided chest pain, onset at 22:00. EXAM: CHEST  2 VIEW COMPARISON:  12/27/2012 FINDINGS: There is moderate vascular and interstitial prominence, new. This likely represents congestive heart failure. There is hyperinflation, with upper lobe emphysematous changes. There are small pleural effusions. There is no confluent airspace consolidation. There is moderate cardiomegaly. IMPRESSION: Probable CHF superimposed on COPD. Electronically Signed   By: Ellery Plunk M.D.   On: 08/15/2015 01:33   Dg Ribs Unilateral Right  08/15/2015  CLINICAL DATA:  Sudden onset sharp right-sided chest wall pain, onset at 22:00. EXAM: RIGHT RIBS - 2 VIEW COMPARISON:  None. FINDINGS: No fracture or other bone lesions are seen involving the ribs. IMPRESSION: Negative. Electronically Signed   By: Ellery Plunk M.D.   On: 08/15/2015 01:41    Microbiology: Recent Results (from the past 240 hour(s))  Culture, blood (routine x 2) Call  MD if unable to obtain prior to antibiotics being given     Status: None (Preliminary result)   Collection Time: 09/07/15  6:37 PM  Result Value Ref Range Status   Specimen Description BLOOD LEFT ARM  Final   Special Requests BOTTLES DRAWN AEROBIC AND ANAEROBIC 6CC  Final   Culture NO GROWTH 3 DAYS  Final   Report Status PENDING  Incomplete  Culture, blood (routine x 2) Call MD if unable to obtain prior to antibiotics being given     Status: None (Preliminary result)   Collection Time: 09/07/15  6:41 PM  Result Value Ref Range Status   Specimen Description BLOOD LEFT HAND  Final   Special Requests BOTTLES DRAWN AEROBIC ONLY 6CC  Final   Culture NO GROWTH 3 DAYS  Final   Report Status PENDING  Incomplete  Culture, sputum-assessment     Status: None   Collection Time: 09/09/15  8:30 AM  Result Value Ref Range Status   Specimen Description SPUTUM  Final   Special Requests NONE  Final   Sputum evaluation   Final    THIS SPECIMEN IS ACCEPTABLE. RESPIRATORY CULTURE REPORT TO FOLLOW.   Report Status 09/09/2015 FINAL  Final  Culture, respiratory (NON-Expectorated)     Status: None (Preliminary result)   Collection Time: 09/09/15  8:30 AM  Result Value Ref Range Status   Specimen Description SPUTUM  Final   Special Requests NONE  Final   Gram Stain PENDING  Incomplete   Culture   Final    Culture reincubated for better growth Performed at [facility]    Report Status PENDING  Incomplete     Labs: Basic Metabolic Panel:  Recent Labs Lab 09/07/15 1350 09/08/15 0624 09/10/15 0515  NA 138 141 144  K 4.7 3.9 4.1  CL 101 105 107  CO2 27 25 29   GLUCOSE 97 145* 125*  BUN 15 13 24*  CREATININE 0.85 0.62 0.50  CALCIUM 9.3 9.5 9.0   Liver Function Tests:  Recent Labs Lab 09/07/15 1350  AST 18  ALT 12*  ALKPHOS 79  BILITOT 0.5  PROT 7.4  ALBUMIN 3.1*   No results for input(s): LIPASE, AMYLASE in the last 168 hours. No results for input(s): AMMONIA in the last 168  hours. CBC:  Recent Labs Lab 09/07/15 1350 09/08/15 0624 09/10/15 0515  WBC 7.4 6.7 10.2  NEUTROABS 5.0  --   --   HGB 10.6* 10.9* 10.7*  HCT 32.9* 34.5* 34.2*  MCV 84.8 84.4 84.4  PLT 302 407* 365   Cardiac Enzymes:  Recent Labs Lab 09/07/15 1350  TROPONINI <0.03   BNP: BNP (last 3 results) No results for input(s): BNP in the last 8760 hours.  ProBNP (last 3 results) No results for input(s): PROBNP in the last 8760 hours.  CBG:  Recent Labs Lab 09/09/15 1114 09/09/15 1642 09/10/15 0050 09/10/15 0802 09/10/15 1142  GLUCAP 106* 126* 149* 131* 98       Signed:  Kasidi Shanker MD.  [facility] 09/10/2015, 11:59 AM

## 2015-09-10 NOTE — Progress Notes (Signed)
Patient discharged with instructions, prescription, and care notes.  Verbalized understanding via teach back.  IV was removed and the site was WNL. Patient voiced no further complaints or concerns at the time of discharge.  Appointments scheduled per instructions.  Patient left the floor via w/c with staff and family in stable condition. 

## 2015-09-10 NOTE — Patient Outreach (Signed)
[facility]) Care Management  09/10/2015  Ashley Houston 06/08/1940 [id number]   Telephone call to patient who was advised of reason for call & [facility].  Patient voices that she is currently in hospital with pneumonia but plans are for discharge later today.  States daughter will be helping care for her and that she has no problems getting medications.   Assessment; referral to community care coordinator after discharge. [facility].  Plan: Advise hospital liaison and refer to community when patient discharged from hospital.  Referred to care management assistant to assign to [facility] for complex case management. Diagnoses : Pneumonia, COPD, CHF.  Ashley CanLinda Harsha Yusko, RN BSN CCM Care Management Coordinator [facility] Care Management  [phone]

## 2015-09-11 LAB — CULTURE, RESPIRATORY

## 2015-09-11 LAB — CULTURE, RESPIRATORY W GRAM STAIN: Culture: NORMAL

## 2015-09-12 ENCOUNTER — Encounter: Payer: Self-pay | Admitting: *Deleted

## 2015-09-12 ENCOUNTER — Other Ambulatory Visit: Payer: Self-pay | Admitting: *Deleted

## 2015-09-12 LAB — CULTURE, BLOOD (ROUTINE X 2)
CULTURE: NO GROWTH
CULTURE: NO GROWTH

## 2015-09-12 NOTE — Patient Outreach (Signed)
09/12/15- Pt discharged 09/10/15 with COPD exacerbation/ pneumonia, pt states she lives with her daughter and that one of her 5 daughters provides transportation, pt states she has a scale but has never been told to weigh daily, pt states she can start doing this if needed and would like to discuss further at home visit.  RN CM faxed barrier letter and transition of care note to primary MD office- Lenise HeraldBenjamin Mann PAC.  [facility] CM Care Plan Problem One        Most Recent Value   Care Plan Problem One  Knowledge deficit related to COPD/ pneumonia   Role Documenting the Problem One  Care Management Coordinator   Care Plan for Problem One  Active   [facility] Long Term Goal (31-90 days)  pt will have no readmissions related to COPD within 90 days   [facility] Long Term Goal Start Date  09/12/15   Interventions for Problem One Long Term Goal  RN CM reviewed importance of attending post hospital follow up appointment and taking all medications to appointment, reviewed importance of taking all medications as prescribed   [facility] CM Short Term Goal #1 (0-30 days)  pt will verbalize COPD action plan within 30 days.   [facility] CM Short Term Goal #1 Start Date  09/12/15   Interventions for Short Term Goal #1  RN CM reviewed COPD action plan, importance of calling MD early for change in health status      Outpatient Encounter Prescriptions as of 09/12/2015  Medication Sig Note  . albuterol (PROVENTIL) (2.5 MG/3ML) 0.083% nebulizer solution Take 3 mLs by nebulization daily as needed.   Marland Kitchen. aspirin 81 MG tablet Take 81 mg by mouth daily.   Marland Kitchen. CALCIUM PO Take 1 tablet by mouth daily.   . cefUROXime (CEFTIN) 500 MG tablet Take 1 tablet (500 mg total) by mouth 2 (two) times daily with a meal. Starting 09/11/15 take as prescribed until completed.   . doxepin (SINEQUAN) 100 MG capsule Take 1 capsule by mouth at bedtime.   . gabapentin (NEURONTIN) 300 MG capsule Take 300 mg by mouth 3 (three) times daily.   Marland Kitchen. HYDROcodone-acetaminophen (NORCO) 10-325  MG tablet Take 1 tablet by mouth every 4 (four) hours as needed for moderate pain.    Marland Kitchen. levothyroxine (SYNTHROID, LEVOTHROID) 25 MCG tablet Take 25 mcg by mouth daily before breakfast.   . LORazepam (ATIVAN) 2 MG tablet Take 2 mg by mouth every 6 (six) hours as needed for anxiety.   . metoprolol succinate (TOPROL-XL) 25 MG 24 hr tablet  09/07/2015: Received from: External Pharmacy  . mometasone-formoterol (DULERA) 100-5 MCG/ACT AERO Inhale 2 puffs into the lungs 2 (two) times daily.   . predniSONE (DELTASONE) 10 MG tablet Starting tomorrow, take 5 tablets daily for 1 day; then 4 tablets the next day; then 3 tablets the next day; then 2 tablets the next day; and then 1 tablet the next day; then stop.   Marland Kitchen. tiotropium (SPIRIVA) 18 MCG inhalation capsule Place 1 capsule (18 mcg total) into inhaler and inhale daily.    No facility-administered encounter medications on file as of 09/12/2015.   PLAN Follow up with initial home visit 09/14/15 Focus on COPD teaching, pneumonia, CHF  Irving ShowsJulie Farmer Dr. [facility]ila[facility] HospitalRNC, BSN [facility] Care Coordinator [phone]

## 2015-09-14 ENCOUNTER — Ambulatory Visit: Payer: Self-pay | Admitting: *Deleted

## 2015-09-17 DIAGNOSIS — J449 Chronic obstructive pulmonary disease, unspecified: Secondary | ICD-10-CM | POA: Diagnosis not present

## 2015-09-17 DIAGNOSIS — Z1389 Encounter for screening for other disorder: Secondary | ICD-10-CM | POA: Diagnosis not present

## 2015-09-17 DIAGNOSIS — I5032 Chronic diastolic (congestive) heart failure: Secondary | ICD-10-CM | POA: Diagnosis not present

## 2015-09-17 DIAGNOSIS — E063 Autoimmune thyroiditis: Secondary | ICD-10-CM | POA: Diagnosis not present

## 2015-09-17 DIAGNOSIS — Z6820 Body mass index (BMI) 20.0-20.9, adult: Secondary | ICD-10-CM | POA: Diagnosis not present

## 2015-09-17 DIAGNOSIS — I5040 Unspecified combined systolic (congestive) and diastolic (congestive) heart failure: Secondary | ICD-10-CM | POA: Diagnosis not present

## 2015-09-21 ENCOUNTER — Encounter: Payer: Self-pay | Admitting: *Deleted

## 2015-09-21 ENCOUNTER — Other Ambulatory Visit: Payer: Self-pay | Admitting: *Deleted

## 2015-09-21 NOTE — Patient Outreach (Signed)
[facility]) Care Management   09/21/2015  Ashley Houston [birth year] [id number]  Ashley Houston is an 75 y.o. female  Subjective: Initial home visit with pt, HIPAA verified, patient's daughter present and states her mother lives with her and her spouse.  Pt states she had pneumonia with most recent hospitalization, states she has CHF and says " but this really hasn't been a problem" pt has started weighing daily and recording at direction of [facility] RN CM.  Pt reports she does smoke and says " but I have cut down"  Pt states she has chronic edema in right lower extremity due to having " blood clot in that leg and had collapsed veins years ago and been swollen ever since".  Pt reports " I have 5 daughters and they are so helpful".  Objective:   Filed Vitals:   09/21/15 1244  BP: 104/54  Pulse: 80  Resp: 18  Height: 1.676 m ( )  Weight: 123 lb (55.792 kg)  SpO2: 97%   ROS  Physical Exam  Constitutional: She is oriented to person, place, and time. She appears well-developed and well-nourished.  HENT:  Head: Normocephalic.  Neck: Normal range of motion. Neck supple.  Cardiovascular: Normal rate and regular rhythm.   Respiratory: Effort normal and breath sounds normal.  Pt has dyspnea at  times with exertion  GI: Soft. Bowel sounds are normal.  Musculoskeletal: Normal range of motion. She exhibits edema.  1+ edema left lower extremity 2+ edema right lower extremity (pt says chronic for many years)  Neurological: She is alert and oriented to person, place, and time.  Skin: Skin is warm and dry.  Psychiatric: She has a normal mood and affect. Her behavior is normal. Judgment and thought content normal.    Encounter Medications:   Outpatient Encounter Prescriptions as of 09/21/2015  Medication Sig Note  . albuterol (PROVENTIL) (2.5 MG/3ML) 0.083% nebulizer solution Take 3 mLs by nebulization daily as needed.   Marland Kitchen aspirin 81 MG tablet Take 81 mg by mouth daily.    Marland Kitchen CALCIUM PO Take 1 tablet by mouth daily.   Marland Kitchen doxepin (SINEQUAN) 100 MG capsule Take 1 capsule by mouth at bedtime.   . ferrous gluconate (FERGON) 324 MG tablet Take 324 mg by mouth daily with breakfast.   . gabapentin (NEURONTIN) 300 MG capsule Take 300 mg by mouth 3 (three) times daily.   Marland Kitchen HYDROcodone-acetaminophen (NORCO) 10-325 MG tablet Take 1 tablet by mouth every 4 (four) hours as needed for moderate pain.    Marland Kitchen levothyroxine (SYNTHROID, LEVOTHROID) 25 MCG tablet Take 25 mcg by mouth daily before breakfast.   . LORazepam (ATIVAN) 2 MG tablet Take 2 mg by mouth every 6 (six) hours as needed for anxiety.   . metoprolol succinate (TOPROL-XL) 25 MG 24 hr tablet Take 0.5 mg by mouth daily.  09/07/2015: Received from: External Pharmacy  . mometasone-formoterol (DULERA) 100-5 MCG/ACT AERO Inhale 2 puffs into the lungs 2 (two) times daily.   Marland Kitchen tiotropium (SPIRIVA) 18 MCG inhalation capsule Place 1 capsule (18 mcg total) into inhaler and inhale daily.   . [DISCONTINUED] cefUROXime (CEFTIN) 500 MG tablet Take 1 tablet (500 mg total) by mouth 2 (two) times daily with a meal. Starting 09/11/15 take as prescribed until completed.   . [DISCONTINUED] predniSONE (DELTASONE) 10 MG tablet Starting tomorrow, take 5 tablets daily for 1 day; then 4 tablets the next day; then 3 tablets the next day; then 2 tablets the next day; and then  1 tablet the next day; then stop.    No facility-administered encounter medications on file as of 09/21/2015.    Functional Status:   In your present state of health, do you have any difficulty performing the following activities: 09/21/2015 09/07/2015  Hearing? N N  Vision? N N  Difficulty concentrating or making decisions? N N  Walking or climbing stairs? N N  Dressing or bathing? N N  Doing errands, shopping? N N  Preparing Food and eating ? N -  Using the Toilet? N -  In the past six months, have you accidently leaked urine? N -  Do you have problems with loss of bowel  control? N -  Managing your Medications? N -  Managing your Finances? N -  Housekeeping or managing your Housekeeping? Y -    Fall/Depression Screening:    PHQ 2/9 Scores 09/21/2015  PHQ - 2 Score 0   Fall Risk  09/21/2015  Falls in the past year? No  Risk for fall due to : Medication side effect    Assessment:  RN CM reviewed ways to prevent sickness and importance of calling MD early for change in health status and symptoms,  Pt managing well living with her daughter and has very good family support, focus of education will be on COPD, CHF management. RN CM reviewed oxygen safety.  RN CM faxed initial home visit and barrier letter to primary care provider [facility]Benjamin Mann [facility].    [facility] CM Care Plan Problem One        Most Recent Value   Care Plan Problem One  Knowledge deficit related to COPD/ pneumonia   Role Documenting the Problem One  Care Management Coordinator   Care Plan for Problem One  Active   [facility] Long Term Goal (31-90 days)  pt will have no readmissions related to COPD within 90 days   [facility] Long Term Goal Start Date  09/12/15   Interventions for Problem One Long Term Goal  RN CM observed all medication bottles and reviewed with pt purpose, dosage and side effects.   [facility] CM Short Term Goal #1 (0-30 days)  pt will verbalize COPD action plan within 30 days.   [facility] CM Short Term Goal #1 Start Date  09/12/15   Interventions for Short Term Goal #1  RN CM gave pt COPD booklet and COPD zones/ action plan and reviewed with pt,  reviewed EMMI Handouts with pt.    [facility] CM Care Plan Problem Two        Most Recent Value   Care Plan Problem Two  Pt needs reinforcement related to CHF   Role Documenting the Problem Two  Care Management Coordinator   Care Plan for Problem Two  Active   [facility] CM Short Term Goal #1 (0-30 days)  pt will read over CHF zones/ action plan and verbalize actions to take if in yellow zone within 30 days.   [facility] CM Short Term Goal #1 Start Date  09/21/15   Interventions for  Short Term Goal #2   RN CM reviewed CHF zones/ action plan and gave pt a copy to read over      Plan: follow up with home visit 6/ 9/17 Continue weekly transition of care calls Assess weight log Continue COPD teaching, CHF  Irving ShowsJulie Farmer [facility] CenterRNC, BSN [facility] Care Coordinator [phone]

## 2015-09-28 ENCOUNTER — Other Ambulatory Visit: Payer: Self-pay | Admitting: *Deleted

## 2015-09-28 ENCOUNTER — Ambulatory Visit: Payer: Self-pay | Admitting: *Deleted

## 2015-09-28 NOTE — Patient Outreach (Signed)
09/28/15- Telephone call to pt for transition of care week 3, no answer to telephone , left message requesting return phone call.  Irving ShowsJulie Ivette Castronova [facility] Luke[facility] HospitalRNC, BSN [facility] Community Care Coordinator [phone]

## 2015-10-02 ENCOUNTER — Other Ambulatory Visit: Payer: Self-pay | Admitting: *Deleted

## 2015-10-02 NOTE — Patient Outreach (Addendum)
Transition of care call #4 attempted but pt was not at home. I did leave a message to return my call at her convenience. If I don't hear back from her I will call her another day this week.  Almetta LovelyCarroll Jamire [facility] ScrevenGNP-BC [facility] Care Manager [phone]  Pt called me back right after I left her the message. She says she is doing well. She is taking her Spiriva and Dulera as directed every day and her albuterol nebs if she needs it and she reports she has not needed it lately. She also carries an Albuterol inhaler in her purse.  She states she is weighing every day and that her weight is stable at 123. I made sure she has my number in case she needs to call me while Irving ShowsJulie Farmer, RN, [facility] Care Manager,  remains on vacation this week. Raynelle FanningJulie will be in touch with her next week.  Almetta LovelyCarroll Laquitha Heslin [facility]NP-BC [facility] Care Manager [phone]

## 2015-10-15 DIAGNOSIS — H6690 Otitis media, unspecified, unspecified ear: Secondary | ICD-10-CM | POA: Diagnosis not present

## 2015-10-15 DIAGNOSIS — G894 Chronic pain syndrome: Secondary | ICD-10-CM | POA: Diagnosis not present

## 2015-10-15 DIAGNOSIS — Z6820 Body mass index (BMI) 20.0-20.9, adult: Secondary | ICD-10-CM | POA: Diagnosis not present

## 2015-10-15 DIAGNOSIS — Z1389 Encounter for screening for other disorder: Secondary | ICD-10-CM | POA: Diagnosis not present

## 2015-10-16 ENCOUNTER — Inpatient Hospital Stay (HOSPITAL_COMMUNITY)
Admission: EM | Admit: 2015-10-16 | Discharge: 2015-10-18 | DRG: 190 | Disposition: A | Discharge status: Home Health | Payer: Commercial Managed Care - HMO | Attending: Internal Medicine | Admitting: Internal Medicine

## 2015-10-16 ENCOUNTER — Emergency Department (HOSPITAL_COMMUNITY): Payer: Commercial Managed Care - HMO

## 2015-10-16 ENCOUNTER — Encounter (HOSPITAL_COMMUNITY): Payer: Self-pay | Admitting: Emergency Medicine

## 2015-10-16 DIAGNOSIS — J189 Pneumonia, unspecified organism: Secondary | ICD-10-CM | POA: Diagnosis present

## 2015-10-16 DIAGNOSIS — J9620 Acute and chronic respiratory failure, unspecified whether with hypoxia or hypercapnia: Secondary | ICD-10-CM | POA: Diagnosis present

## 2015-10-16 DIAGNOSIS — I5040 Unspecified combined systolic (congestive) and diastolic (congestive) heart failure: Secondary | ICD-10-CM | POA: Diagnosis not present

## 2015-10-16 DIAGNOSIS — M7050 Other bursitis of knee, unspecified knee: Secondary | ICD-10-CM | POA: Diagnosis present

## 2015-10-16 DIAGNOSIS — F17210 Nicotine dependence, cigarettes, uncomplicated: Secondary | ICD-10-CM | POA: Diagnosis not present

## 2015-10-16 DIAGNOSIS — I5032 Chronic diastolic (congestive) heart failure: Secondary | ICD-10-CM | POA: Diagnosis present

## 2015-10-16 DIAGNOSIS — R531 Weakness: Secondary | ICD-10-CM | POA: Diagnosis not present

## 2015-10-16 DIAGNOSIS — R739 Hyperglycemia, unspecified: Secondary | ICD-10-CM | POA: Diagnosis present

## 2015-10-16 DIAGNOSIS — I110 Hypertensive heart disease with heart failure: Secondary | ICD-10-CM | POA: Diagnosis not present

## 2015-10-16 DIAGNOSIS — J440 Chronic obstructive pulmonary disease with acute lower respiratory infection: Secondary | ICD-10-CM | POA: Diagnosis present

## 2015-10-16 DIAGNOSIS — Z79899 Other long term (current) drug therapy: Secondary | ICD-10-CM | POA: Diagnosis not present

## 2015-10-16 DIAGNOSIS — Z7982 Long term (current) use of aspirin: Secondary | ICD-10-CM

## 2015-10-16 DIAGNOSIS — J40 Bronchitis, not specified as acute or chronic: Secondary | ICD-10-CM | POA: Diagnosis present

## 2015-10-16 DIAGNOSIS — D649 Anemia, unspecified: Secondary | ICD-10-CM | POA: Diagnosis present

## 2015-10-16 DIAGNOSIS — F419 Anxiety disorder, unspecified: Secondary | ICD-10-CM | POA: Diagnosis not present

## 2015-10-16 DIAGNOSIS — F329 Major depressive disorder, single episode, unspecified: Secondary | ICD-10-CM | POA: Diagnosis not present

## 2015-10-16 DIAGNOSIS — J441 Chronic obstructive pulmonary disease with (acute) exacerbation: Secondary | ICD-10-CM | POA: Diagnosis not present

## 2015-10-16 DIAGNOSIS — E039 Hypothyroidism, unspecified: Secondary | ICD-10-CM | POA: Diagnosis not present

## 2015-10-16 DIAGNOSIS — R0602 Shortness of breath: Secondary | ICD-10-CM | POA: Diagnosis not present

## 2015-10-16 DIAGNOSIS — J9621 Acute and chronic respiratory failure with hypoxia: Secondary | ICD-10-CM | POA: Diagnosis not present

## 2015-10-16 DIAGNOSIS — Z66 Do not resuscitate: Secondary | ICD-10-CM | POA: Diagnosis present

## 2015-10-16 DIAGNOSIS — I10 Essential (primary) hypertension: Secondary | ICD-10-CM

## 2015-10-16 DIAGNOSIS — E038 Other specified hypothyroidism: Secondary | ICD-10-CM

## 2015-10-16 DIAGNOSIS — J449 Chronic obstructive pulmonary disease, unspecified: Secondary | ICD-10-CM | POA: Diagnosis not present

## 2015-10-16 DIAGNOSIS — Z9981 Dependence on supplemental oxygen: Secondary | ICD-10-CM | POA: Diagnosis not present

## 2015-10-16 DIAGNOSIS — Z8701 Personal history of pneumonia (recurrent): Secondary | ICD-10-CM | POA: Diagnosis not present

## 2015-10-16 DIAGNOSIS — Z720 Tobacco use: Secondary | ICD-10-CM

## 2015-10-16 DIAGNOSIS — R079 Chest pain, unspecified: Secondary | ICD-10-CM | POA: Diagnosis not present

## 2015-10-16 DIAGNOSIS — T380X5A Adverse effect of glucocorticoids and synthetic analogues, initial encounter: Secondary | ICD-10-CM | POA: Diagnosis present

## 2015-10-16 LAB — URINALYSIS, ROUTINE W REFLEX MICROSCOPIC
Bilirubin Urine: NEGATIVE
Glucose, UA: NEGATIVE mg/dL
Hgb urine dipstick: NEGATIVE
Ketones, ur: NEGATIVE mg/dL
NITRITE: NEGATIVE
PROTEIN: NEGATIVE mg/dL
SPECIFIC GRAVITY, URINE: 1.01 (ref 1.005–1.030)
pH: 5.5 (ref 5.0–8.0)

## 2015-10-16 LAB — HEPATIC FUNCTION PANEL
ALBUMIN: 3.2 g/dL — AB (ref 3.5–5.0)
ALK PHOS: 93 U/L (ref 38–126)
ALT: 16 U/L (ref 14–54)
AST: 19 U/L (ref 15–41)
Bilirubin, Direct: 0.2 mg/dL (ref 0.1–0.5)
Indirect Bilirubin: 0.4 mg/dL (ref 0.3–0.9)
TOTAL PROTEIN: 6.9 g/dL (ref 6.5–8.1)
Total Bilirubin: 0.6 mg/dL (ref 0.3–1.2)

## 2015-10-16 LAB — BASIC METABOLIC PANEL
ANION GAP: 7 (ref 5–15)
BUN: 20 mg/dL (ref 6–20)
CO2: 29 mmol/L (ref 22–32)
Calcium: 8.9 mg/dL (ref 8.9–10.3)
Chloride: 99 mmol/L — ABNORMAL LOW (ref 101–111)
Creatinine, Ser: 0.76 mg/dL (ref 0.44–1.00)
GLUCOSE: 133 mg/dL — AB (ref 65–99)
POTASSIUM: 4 mmol/L (ref 3.5–5.1)
Sodium: 135 mmol/L (ref 135–145)

## 2015-10-16 LAB — URINE MICROSCOPIC-ADD ON: RBC / HPF: NONE SEEN RBC/hpf (ref 0–5)

## 2015-10-16 LAB — CBC
HEMATOCRIT: 27.7 % — AB (ref 36.0–46.0)
HEMOGLOBIN: 8.9 g/dL — AB (ref 12.0–15.0)
MCH: 27.1 pg (ref 26.0–34.0)
MCHC: 32.1 g/dL (ref 30.0–36.0)
MCV: 84.2 fL (ref 78.0–100.0)
Platelets: 284 10*3/uL (ref 150–400)
RBC: 3.29 MIL/uL — AB (ref 3.87–5.11)
RDW: 17.7 % — ABNORMAL HIGH (ref 11.5–15.5)
WBC: 11.2 10*3/uL — AB (ref 4.0–10.5)

## 2015-10-16 LAB — I-STAT CG4 LACTIC ACID, ED
LACTIC ACID, VENOUS: 1.26 mmol/L (ref 0.5–2.0)
LACTIC ACID, VENOUS: 0.87 mmol/L (ref 0.5–2.0)

## 2015-10-16 MED ORDER — PIPERACILLIN-TAZOBACTAM 3.375 G IVPB 30 MIN
3.3750 g | Freq: Once | INTRAVENOUS | Status: AC
  Administered 2015-10-16: 3.375 g via INTRAVENOUS
  Filled 2015-10-16: qty 50

## 2015-10-16 MED ORDER — ONDANSETRON HCL 4 MG/2ML IJ SOLN: 4.0000 mg | Freq: Four times a day (QID) | INTRAMUSCULAR | Status: DC | PRN

## 2015-10-16 MED ORDER — IPRATROPIUM-ALBUTEROL 0.5-2.5 (3) MG/3ML IN SOLN
3.0000 mL | Freq: Four times a day (QID) | RESPIRATORY_TRACT | Status: DC
  Administered 2015-10-17 (×4): 3 mL via RESPIRATORY_TRACT
  Filled 2015-10-16 (×4): qty 3

## 2015-10-16 MED ORDER — LEVOTHYROXINE SODIUM 25 MCG PO TABS
25.0000 ug | ORAL_TABLET | Freq: Every day | ORAL | Status: DC
  Administered 2015-10-17 – 2015-10-18 (×2): 25 ug via ORAL
  Filled 2015-10-16 (×2): qty 1

## 2015-10-16 MED ORDER — ENOXAPARIN SODIUM 40 MG/0.4ML ~~LOC~~ SOLN
40.0000 mg | SUBCUTANEOUS | Status: DC
  Administered 2015-10-17 – 2015-10-18 (×2): 40 mg via SUBCUTANEOUS
  Filled 2015-10-16 (×2): qty 0.4

## 2015-10-16 MED ORDER — ACETAMINOPHEN 650 MG RE SUPP: 650.0000 mg | Freq: Four times a day (QID) | RECTAL | Status: DC | PRN

## 2015-10-16 MED ORDER — DOXEPIN HCL 25 MG PO CAPS
100.0000 mg | ORAL_CAPSULE | Freq: Every day | ORAL | Status: DC
  Administered 2015-10-17 (×2): 100 mg via ORAL
  Filled 2015-10-16: qty 4

## 2015-10-16 MED ORDER — FLEET ENEMA 7-19 GM/118ML RE ENEM: 1.0000 | ENEMA | Freq: Once | RECTAL | Status: DC | PRN

## 2015-10-16 MED ORDER — ALBUTEROL SULFATE HFA 108 (90 BASE) MCG/ACT IN AERS
2.0000 | INHALATION_SPRAY | RESPIRATORY_TRACT | Status: DC | PRN
  Filled 2015-10-16: qty 6.7

## 2015-10-16 MED ORDER — ACETAMINOPHEN 325 MG PO TABS: 650.0000 mg | ORAL_TABLET | Freq: Four times a day (QID) | ORAL | Status: DC | PRN

## 2015-10-16 MED ORDER — DOCUSATE SODIUM 100 MG PO CAPS
100.0000 mg | ORAL_CAPSULE | Freq: Two times a day (BID) | ORAL | Status: DC
  Administered 2015-10-17 – 2015-10-18 (×4): 100 mg via ORAL
  Filled 2015-10-16 (×4): qty 1

## 2015-10-16 MED ORDER — ONDANSETRON HCL 4 MG PO TABS: 4.0000 mg | ORAL_TABLET | Freq: Four times a day (QID) | ORAL | Status: DC | PRN

## 2015-10-16 MED ORDER — GABAPENTIN 300 MG PO CAPS
300.0000 mg | ORAL_CAPSULE | Freq: Three times a day (TID) | ORAL | Status: DC
  Administered 2015-10-17 – 2015-10-18 (×5): 300 mg via ORAL
  Filled 2015-10-16 (×5): qty 1

## 2015-10-16 MED ORDER — ALBUTEROL SULFATE (2.5 MG/3ML) 0.083% IN NEBU
5.0000 mg | INHALATION_SOLUTION | Freq: Once | RESPIRATORY_TRACT | Status: AC
  Administered 2015-10-16: 5 mg via RESPIRATORY_TRACT
  Filled 2015-10-16: qty 6

## 2015-10-16 MED ORDER — LORAZEPAM 1 MG PO TABS: 2.0000 mg | ORAL_TABLET | Freq: Four times a day (QID) | ORAL | Status: DC | PRN

## 2015-10-16 MED ORDER — IPRATROPIUM-ALBUTEROL 0.5-2.5 (3) MG/3ML IN SOLN
3.0000 mL | Freq: Once | RESPIRATORY_TRACT | Status: AC
  Administered 2015-10-16: 3 mL via RESPIRATORY_TRACT
  Filled 2015-10-16: qty 3

## 2015-10-16 MED ORDER — ADULT MULTIVITAMIN W/MINERALS CH
1.0000 | ORAL_TABLET | Freq: Two times a day (BID) | ORAL | Status: DC
  Administered 2015-10-17 – 2015-10-18 (×4): 1 via ORAL
  Filled 2015-10-16 (×4): qty 1

## 2015-10-16 MED ORDER — SODIUM CHLORIDE 0.9 % IV BOLUS (SEPSIS)
1000.0000 mL | Freq: Once | INTRAVENOUS | Status: AC
  Administered 2015-10-16: 1000 mL via INTRAVENOUS

## 2015-10-16 MED ORDER — ASPIRIN EC 81 MG PO TBEC
81.0000 mg | DELAYED_RELEASE_TABLET | Freq: Every day | ORAL | Status: DC
  Administered 2015-10-17 – 2015-10-18 (×2): 81 mg via ORAL
  Filled 2015-10-16 (×2): qty 1

## 2015-10-16 MED ORDER — METOPROLOL SUCCINATE ER 25 MG PO TB24
12.5000 mg | ORAL_TABLET | Freq: Every day | ORAL | Status: DC
  Administered 2015-10-18: 12.5 mg via ORAL
  Filled 2015-10-16 (×2): qty 1

## 2015-10-16 MED ORDER — OXYCODONE HCL 5 MG PO TABS
5.0000 mg | ORAL_TABLET | ORAL | Status: DC | PRN
  Filled 2015-10-16: qty 1

## 2015-10-16 MED ORDER — POTASSIUM CHLORIDE IN NACL 20-0.45 MEQ/L-% IV SOLN
INTRAVENOUS | Status: DC
  Administered 2015-10-16 – 2015-10-18 (×3): via INTRAVENOUS
  Filled 2015-10-16 (×5): qty 1000

## 2015-10-16 MED ORDER — METHYLPREDNISOLONE SODIUM SUCC 125 MG IJ SOLR: 125.0000 mg | Freq: Once | INTRAMUSCULAR | Status: DC

## 2015-10-16 MED ORDER — HYDROCODONE-ACETAMINOPHEN 10-325 MG PO TABS
1.0000 | ORAL_TABLET | ORAL | Status: DC | PRN
  Administered 2015-10-17 – 2015-10-18 (×3): 1 via ORAL
  Filled 2015-10-16 (×3): qty 1

## 2015-10-16 MED ORDER — METHYLPREDNISOLONE SODIUM SUCC 125 MG IJ SOLR
60.0000 mg | Freq: Three times a day (TID) | INTRAMUSCULAR | Status: DC
  Administered 2015-10-17 – 2015-10-18 (×4): 60 mg via INTRAVENOUS
  Filled 2015-10-16 (×4): qty 2

## 2015-10-16 MED ORDER — ALBUTEROL SULFATE (2.5 MG/3ML) 0.083% IN NEBU: 3.0000 mL | INHALATION_SOLUTION | RESPIRATORY_TRACT | Status: DC | PRN

## 2015-10-16 MED ORDER — ALBUTEROL SULFATE (2.5 MG/3ML) 0.083% IN NEBU: 2.5000 mg | INHALATION_SOLUTION | RESPIRATORY_TRACT | Status: DC | PRN

## 2015-10-16 MED ORDER — LEVOFLOXACIN IN D5W 750 MG/150ML IV SOLN
750.0000 mg | INTRAVENOUS | Status: DC
  Administered 2015-10-17 – 2015-10-18 (×2): 750 mg via INTRAVENOUS
  Filled 2015-10-16 (×2): qty 150

## 2015-10-16 MED ORDER — POLYETHYLENE GLYCOL 3350 17 G PO PACK: 17.0000 g | PACK | Freq: Every day | ORAL | Status: DC | PRN

## 2015-10-16 MED ORDER — DOXEPIN HCL 50 MG PO CAPS
ORAL_CAPSULE | ORAL | Status: AC
  Filled 2015-10-16: qty 2

## 2015-10-16 MED ORDER — VANCOMYCIN HCL IN DEXTROSE 1-5 GM/200ML-% IV SOLN
1000.0000 mg | Freq: Once | INTRAVENOUS | Status: AC
  Administered 2015-10-16: 1000 mg via INTRAVENOUS
  Filled 2015-10-16: qty 200

## 2015-10-16 NOTE — ED Notes (Signed)
Patient states she can't void.

## 2015-10-16 NOTE — ED Notes (Signed)
Patient transported to X-ray 

## 2015-10-16 NOTE — ED Notes (Addendum)
PT c/o increased SOB on exertion with fever at night x3 days and pt reports hx of recent pneumonia. PT states she wears oxygen on 2L per N/C prn at home.

## 2015-10-16 NOTE — ED Provider Notes (Signed)
CSN: [id number]     Arrival date & time 10/16/15  1535 History   First MD Initiated Contact with Patient 10/16/15 1559     Chief Complaint  Patient presents with  . Shortness of Breath     (Consider location/radiation/quality/duration/timing/severity/associated sxs/prior Treatment) Patient is a 75 y.o. female presenting with shortness of breath. [name] (pt complains of sob and cough.  history of pneumonia).  Shortness of Breath Severity:  Moderate Onset quality:  Sudden Timing:  Constant Progression:  Waxing and waning Chronicity:  New Context: activity   Relieved by:  Nothing Worsened by:  Nothing tried Associated symptoms: wheezing   Associated symptoms: no abdominal pain, no chest pain, no cough, no headaches and no rash     Past Medical History  Diagnosis Date  . COPD (chronic obstructive pulmonary disease) (HCC)   . Tachycardia   . Thyroid disease   . Anxiety   . Chronic pain     hips back and shoulders  . Depression   . Chronic diastolic heart failure (HCC)   . Hypothyroidism 09/08/2015   Past Surgical History  Procedure Laterality Date  . Abdominal hysterectomy    . Tonsillectomy    . Cholecystectomy     History reviewed. No pertinent family history. Social History  Substance Use Topics  . Smoking status: Current Every Day Smoker -- 0.50 packs/day    Types: Cigarettes  . Smokeless tobacco: Never Used  . Alcohol Use: No   OB History    Gravida Para Term Preterm AB TAB SAB Ectopic Multiple Living   5 5 5       5      Review of Systems  Constitutional: Negative for appetite change and fatigue.  HENT: Negative for congestion, ear discharge and sinus pressure.   Eyes: Negative for discharge.  Respiratory: Positive for shortness of breath and wheezing. Negative for cough.   Cardiovascular: Negative for chest pain.  Gastrointestinal: Negative for abdominal pain and diarrhea.  Genitourinary: Negative for frequency and  hematuria.  Musculoskeletal: Negative for back pain.  Skin: Negative for rash.  Neurological: Negative for seizures and headaches.  Psychiatric/Behavioral: Negative for hallucinations.      Allergies  Lipitor  Home Medications   Prior to Admission medications   Medication Sig Start Date End Date Taking? Authorizing Provider  albuterol (PROVENTIL) (2.5 MG/3ML) 0.083% nebulizer solution Take 3 mLs by nebulization daily as needed for wheezing or shortness of breath.  08/04/15  Yes Historical Provider, MD  aspirin 81 MG tablet Take 81 mg by mouth daily.   Yes Historical Provider, MD  azithromycin (ZITHROMAX) 250 MG tablet TAKE 2 TABLETS TODAY, THEN 1 TABLET DAILY THEREAFTER UNTIL ALL ARE TAKEN FOR 5 DAYS (ZPAK) 10/15/15  Yes Historical Provider, MD  CALCIUM PO Take 1 tablet by mouth daily.   Yes Historical Provider, MD  doxepin (SINEQUAN) 100 MG capsule Take 1 capsule by mouth at bedtime. 07/31/15  Yes Historical Provider, MD  gabapentin (NEURONTIN) 300 MG capsule Take 300 mg by mouth 3 (three) times daily.   Yes Historical Provider, MD  HYDROcodone-acetaminophen (NORCO) 10-325 MG tablet Take 1 tablet by mouth every 4 (four) hours as needed for moderate pain.    Yes Historical Provider, MD  levothyroxine (SYNTHROID, LEVOTHROID) 25 MCG tablet Take 25 mcg by mouth daily before breakfast.   Yes Historical Provider, MD  LORazepam (ATIVAN) 2 MG tablet Take 2 mg by mouth every 6 (six) hours as needed for anxiety.  Yes Historical Provider, MD  metoprolol succinate (TOPROL-XL) 25 MG 24 hr tablet Take 0.5 mg by mouth daily.  08/30/15  Yes Historical Provider, MD  mometasone-formoterol (DULERA) 100-5 MCG/ACT AERO Inhale 2 puffs into the lungs 2 (two) times daily. 09/10/15  Yes Elliot Cousin, MD  Multiple Vitamin (MULTIVITAMIN WITH MINERALS) TABS tablet Take 1 tablet by mouth 2 (two) times daily.   Yes Historical Provider, MD  SSD 1 % cream APPLY CREAM TO AFFECTED AREA(S) FOUR TIMES DAILY 10/15/15  Yes  Historical Provider, MD  tiotropium (SPIRIVA) 18 MCG inhalation capsule Place 1 capsule (18 mcg total) into inhaler and inhale daily. 09/10/15  Yes Elliot Cousin, MD  ferrous gluconate (FERGON) 324 MG tablet Take 324 mg by mouth daily with breakfast.    Historical Provider, MD   BP 108/64 mmHg  Pulse 69  Temp(Src) 98.1 F (36.7 C) (Oral)  Resp 17  Ht 5\' 6"  (1.676 m)  Wt 124 lb 14.4 oz (56.654 kg)  BMI 20.17 kg/m2  SpO2 95% Physical Exam  Constitutional: She is oriented to person, place, and time. She appears well-developed.  HENT:  Head: Normocephalic.  Eyes: Conjunctivae and EOM are normal. No scleral icterus.  Neck: Neck supple. No thyromegaly present.  Cardiovascular: Normal rate and regular rhythm.  Exam reveals no gallop and no friction rub.   No murmur heard. Pulmonary/Chest: No stridor. She has wheezes. She has no rales. She exhibits no tenderness.  Abdominal: She exhibits no distension. There is no tenderness. There is no rebound.  Musculoskeletal: Normal range of motion. She exhibits no edema.  Lymphadenopathy:    She has no cervical adenopathy.  Neurological: She is oriented to person, place, and time. She exhibits normal muscle tone. Coordination normal.  Skin: No rash noted. No erythema.  Psychiatric: She has a normal mood and affect. Her behavior is normal.    ED Course  Procedures (including critical care time) Labs Review Labs Reviewed  BASIC METABOLIC PANEL - Abnormal; Notable for the following:    Chloride 99 (*)    Glucose, Bld 133 (*)    All other components within normal limits  CBC - Abnormal; Notable for the following:    WBC 11.2 (*)    RBC 3.29 (*)    Hemoglobin 8.9 (*)    HCT 27.7 (*)    RDW 17.7 (*)    All other components within normal limits  HEPATIC FUNCTION PANEL - Abnormal; Notable for the following:    Albumin 3.2 (*)    All other components within normal limits  CULTURE, BLOOD (ROUTINE X 2)  CULTURE, BLOOD (ROUTINE X 2)  URINE CULTURE   URINALYSIS, ROUTINE W REFLEX MICROSCOPIC (NOT AT [facility])  I-STAT CG4 LACTIC ACID, ED    Imaging Review Dg Chest 2 View  10/16/2015  CLINICAL DATA:  Fever and shortness of breath for 3 days. Right-sided chest pain. Hypoxia. Recent pneumonia. EXAM: CHEST  2 VIEW COMPARISON:  09/07/2015 FINDINGS: Changes of COPD again demonstrated. Patchy airspace disease in the right midlung, likely in the inferior aspect of the right upper lobe shows no significant change compared to previous study, and remains suspicious for pneumonia. Left lung remains clear. No evidence of pleural effusion. IMPRESSION: Patchy inferior right upper lobe airspace disease shows little or no significant change, can't remains suspicious for pneumonia. Consider continued radiographic followup or chest CT. COPD. Electronically Signed   By: Myles Rosenthal M.D.   On: 10/16/2015 17:42   I have personally reviewed and evaluated these  images and lab results as part of my medical decision-making.   EKG Interpretation   Date/Time:  Tuesday October 16 2015 16:04:07 EDT Ventricular Rate:  69 PR Interval:  164 QRS Duration: 85 QT Interval:  399 QTC Calculation: 427 R Axis:   32 Text Interpretation:  Sinus rhythm Abnormal R-wave progression, early  transition Confirmed by Lianny Molter  MD, Voncille Simm [phone]) on 10/16/2015 4:54:55 PM      MDM   Final diagnoses:  Healthcare-associated pneumonia    Pneumonia and copd.   Admit for antibiotics and steroids    Bethann Berkshire, MD 10/16/15 [phone]

## 2015-10-16 NOTE — H&P (Signed)
[facility] History and Physical  Ashley Houston QMV:[id number] DOB: 1940-07-22 DOA: 10/16/2015  Referring physician: Dr. Clelia Houston PCP: Ashley Herald, PA-C   Chief Complaint: Dyspnea  HPI: Ashley Houston is a 75 y.o. female with history of COPD, hypothyroid, depression/ anxiety who presented to ED today w fevers and chills x 3 days, worsening DOE.  Coughing as well and some R sided chest pain.  Chronic gray sputum production unchanged.  Fevers to 102 at home.  Was here April 5-8 with same symptoms (SOB/ R chest pain) and treated for CAP/ COPD flare w usual medications. Then she was admitted from 4/28 - 09/10/15 with COPD exacerbation / hypoxemia and was treated with IV abx and improved.  The CXR changes R mid lung were felt to be residual from prior admission. IN ED today pt is afebril,e BP's soft 105/65, HR 70 and R 18,  RA sat was 89%, up to 94% on 2L.    Pt denies any N/V/d, abd pain, no dysuria, no HA.  Has had nasal congestion/ stuffy nose.    Patient grew up in [place], went to [employer] on [address]. Moved to [place] as a young mother.  1st marriage was 25 yrs, husband was an Probation officer, they had 5 daughters. He died of leukemia.  Divorced and married again for 26 yrs, 2nd husband worked for [employer] working on big trucks. 2nd husband died of multiple health problems. 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 No etoh, long-term smoker, has cut back.  In her spare time she likes to shop at [employer].  She won't drive to [place] however due to disc and back pain problems as well as knee bursitis. She doesn't use a cane or walker.  She lives w her oldest daughter and her husband.  He works as a TEFL teacher on [employer] and is out of town frequently, comes home on the weekends.        ROS  denies CP  no joint pain   no HA  no blurry vision  no rash  no diarrhea  no nausea/ vomiting  no dysuria  no difficulty voiding  no change in urine color    Past Medical  History  Past Medical History  Diagnosis Date  . COPD (chronic obstructive pulmonary disease) (HCC)   . Tachycardia   . Thyroid disease   . Anxiety   . Chronic pain     hips back and shoulders  . Depression   . Chronic diastolic heart failure (HCC)   . Hypothyroidism 09/08/2015   Past Surgical History  Past Surgical History  Procedure Laterality Date  . Abdominal hysterectomy    . Tonsillectomy    . Cholecystectomy     Family History History reviewed. No pertinent family history. Social History  reports that she has been smoking Cigarettes.  She has been smoking about 0.50 packs per day. She has never used smokeless tobacco. She reports that she does not drink alcohol or use illicit drugs. Allergies  Allergies  Allergen Reactions  . Lipitor [Atorvastatin] Itching   Home medications Prior to Admission medications   Medication Sig Start Date End Date Taking? Authorizing Provider  albuterol (PROVENTIL) (2.5 MG/3ML) 0.083% nebulizer solution Take 3 mLs by nebulization daily as needed for wheezing or shortness of breath.  08/04/15  Yes Historical Provider, MD  aspirin 81 MG tablet Take 81 mg by mouth daily.   Yes Historical Provider, MD  azithromycin (ZITHROMAX) 250 MG tablet TAKE  2 TABLETS TODAY, THEN 1 TABLET DAILY THEREAFTER UNTIL ALL ARE TAKEN FOR 5 DAYS (ZPAK) 10/15/15  Yes Historical Provider, MD  CALCIUM PO Take 1 tablet by mouth daily.   Yes Historical Provider, MD  doxepin (SINEQUAN) 100 MG capsule Take 1 capsule by mouth at bedtime. 07/31/15  Yes Historical Provider, MD  gabapentin (NEURONTIN) 300 MG capsule Take 300 mg by mouth 3 (three) times daily.   Yes Historical Provider, MD  HYDROcodone-acetaminophen (NORCO) 10-325 MG tablet Take 1 tablet by mouth every 4 (four) hours as needed for moderate pain.    Yes Historical Provider, MD  levothyroxine (SYNTHROID, LEVOTHROID) 25 MCG tablet Take 25 mcg by mouth daily before breakfast.   Yes Historical Provider, MD  LORazepam (ATIVAN)  2 MG tablet Take 2 mg by mouth every 6 (six) hours as needed for anxiety.   Yes Historical Provider, MD  metoprolol succinate (TOPROL-XL) 25 MG 24 hr tablet Take 0.5 mg by mouth daily.  08/30/15  Yes Historical Provider, MD  mometasone-formoterol (DULERA) 100-5 MCG/ACT AERO Inhale 2 puffs into the lungs 2 (two) times daily. 09/10/15  Yes Elliot Cousinenise Fisher, MD  Multiple Vitamin (MULTIVITAMIN WITH MINERALS) TABS tablet Take 1 tablet by mouth 2 (two) times daily.   Yes Historical Provider, MD  SSD 1 % cream APPLY CREAM TO AFFECTED AREA(S) FOUR TIMES DAILY 10/15/15  Yes Historical Provider, MD  tiotropium (SPIRIVA) 18 MCG inhalation capsule Place 1 capsule (18 mcg total) into inhaler and inhale daily. 09/10/15  Yes Elliot Cousinenise Fisher, MD  ferrous gluconate (FERGON) 324 MG tablet Take 324 mg by mouth daily with breakfast.    Historical Provider, MD   Liver Function Tests  Recent Labs Lab 10/16/15 1604  AST 19  ALT 16  ALKPHOS 93  BILITOT 0.6  PROT 6.9  ALBUMIN 3.2*   No results for input(s): LIPASE, AMYLASE in the last 168 hours. CBC  Recent Labs Lab 10/16/15 1604  WBC 11.2*  HGB 8.9*  HCT 27.7*  MCV 84.2  PLT 284   Basic Metabolic Panel  Recent Labs Lab 10/16/15 1604  NA 135  K 4.0  CL 99*  CO2 29  GLUCOSE 133*  BUN 20  CREATININE 0.76  CALCIUM 8.9     Filed Vitals:   10/16/15 1900 10/16/15 1919 10/16/15 2024 10/16/15 2121  BP: 100/61 100/61 101/56 101/55  Pulse: 72 78 72 84  Temp:   98.9 F (37.2 C) 99.6 F (37.6 C)  TempSrc:   Oral Oral  Resp: 15 18 18 16   Height:    5\' 6"  (1.676 m)  Weight:    57.153 kg (126 lb)  SpO2: 96% 91% 95% 94%   Exam: Gen nasal O2, not in distress, looks tired No rash, cyanosis or gangrene Sclera anicteric, throat clear  No jvd or bruits Chest scattered exp wheezes, occ crackles at bases, mostly clear RRR no MRG Abd soft ntnd no mass or ascites +bs GU defer MS no joint effusions or deformity Ext no LE edema / no wounds or ulcers Neuro is  alert, Ox 3 , nf   EKG (independ reviewed) > NSR no acute change CXR (independ reviewed) > faint / small infiltrate R mid lung, similar to prior films in April, COPD   Assessment: 1  Acute exacerbation of COPD - assoc w fevers, poss bronchitis/ sinusitis.  CXR changes not new, unchanged from prior so doubt new PNA.  Possibly scarring.  Plan admit, IVF, IV abx, steroids and nebs.  O2 support.  Not sure why having recurrent resp infections. Consider sinus imaging 2  Tobacco use 3  Depression/ anxiety 4  HTN on metoprolol 5  DNR - pt request  Plan - as above     Maree Krabbe [facility] Pager [phone]  Cell [phone]  If 7PM-7AM, please contact night-coverage [email or web address] Password [facility] 10/16/2015, 9:46 PM

## 2015-10-17 DIAGNOSIS — J9621 Acute and chronic respiratory failure with hypoxia: Secondary | ICD-10-CM

## 2015-10-17 DIAGNOSIS — J441 Chronic obstructive pulmonary disease with (acute) exacerbation: Principal | ICD-10-CM

## 2015-10-17 LAB — CBC
HCT: 30.1 % — ABNORMAL LOW (ref 36.0–46.0)
Hemoglobin: 9.7 g/dL — ABNORMAL LOW (ref 12.0–15.0)
MCH: 27.2 pg (ref 26.0–34.0)
MCHC: 32.2 g/dL (ref 30.0–36.0)
MCV: 84.6 fL (ref 78.0–100.0)
Platelets: 342 10*3/uL (ref 150–400)
RBC: 3.56 MIL/uL — ABNORMAL LOW (ref 3.87–5.11)
RDW: 17.6 % — ABNORMAL HIGH (ref 11.5–15.5)
WBC: 15.4 10*3/uL — ABNORMAL HIGH (ref 4.0–10.5)

## 2015-10-17 MED ORDER — LORAZEPAM 1 MG PO TABS
1.0000 mg | ORAL_TABLET | Freq: Four times a day (QID) | ORAL | Status: DC | PRN
  Administered 2015-10-18: 1 mg via ORAL
  Filled 2015-10-17: qty 1

## 2015-10-17 NOTE — Evaluation (Signed)
Physical Therapy Evaluation Patient Details Name: Ashley Houston MRN: [id number] DOB: 1940/11/06 Today's Date: 10/17/2015   History of Present Illness  75 yo F admitted with COPD exacerbation with 3 days of DOE, cough, and R sided chest pain. PMH: COPD, tachycardia, thyroid disease, anxiety, chronic pain in hips, back and shoulders, depression, CHF, hypothyroidism, cholecystectomy.   Clinical Impression  Pt received in bed, and was agreeable to PT evaluation.  Pt states she is normally independent with ambulation, ADL's, and driving.  Today, she demonstrated all transfers at Mod (I) level, as well as ambulated 467ft with no AD.  Pt does not demonstrate need for skilled PT at this time, therefore will sign off.  Encouraged pt to mobilize as much as possible while in the acute care setting.     Follow Up Recommendations No PT follow up    Equipment Recommendations  None recommended by PT    Recommendations for Other Services       Precautions / Restrictions Restrictions Weight Bearing Restrictions: No      Mobility  Bed Mobility Overal bed mobility: Modified Independent                Transfers Overall transfer level: Modified independent                  Ambulation/Gait Ambulation/Gait assistance: Supervision Ambulation Distance (Feet): 400 Feet Assistive device: None Gait Pattern/deviations: WFL(Within Functional Limits)     General Gait Details: 1 minor LOB where pt was able to steady herself.   Stairs            Wheelchair Mobility    Modified Rankin (Stroke Patients Only)       Balance Overall balance assessment: Independent                                           Pertinent Vitals/Pain Pain Assessment: 0-10 Pain Score: 6  Pain Location: chronic low back pain.  Pain Descriptors / Indicators: Aching Pain Intervention(s): Limited activity within patient's tolerance    Home Living Family/patient expects to be  discharged to:: Private residence Living Arrangements: Children (lives with her dtr and son in Social [place]. ) Available Help at Discharge: Family Type of Home: House Home Access: Level entry     Home Layout: Two level;Multi-level Home Equipment: Environmental consultant - 2 wheels      Prior Function Level of Independence: Independent               Hand Dominance        Extremity/Trunk Assessment   Upper Extremity Assessment: Overall WFL for tasks assessed           Lower Extremity Assessment: Overall WFL for tasks assessed         Communication   Communication: No difficulties  Cognition Arousal/Alertness: Awake/alert Behavior During Therapy: WFL for tasks assessed/performed Overall Cognitive Status: Within Functional Limits for tasks assessed                      General Comments      Exercises        Assessment/Plan    PT Assessment Patent does not need any further PT services  PT Diagnosis Generalized weakness   PT Problem List    PT Treatment Interventions     PT Goals (Current goals can be found in the Care Plan section)  Frequency     Barriers to discharge        Co-evaluation               End of Session Equipment Utilized During Treatment: Gait belt;Oxygen Activity Tolerance: Patient tolerated treatment well Patient left: in chair Nurse Communication: Mobility status    Functional Assessment Tool Used: [employer] AM-PAC "6-clicks"  Functional Limitation: Mobility: Walking and moving around Mobility: Walking and Moving Around Current Status [phone](G8978): At least 1 percent but less than 20 percent impaired, limited or restricted Mobility: Walking and Moving Around Goal Status [phone](G8979): At least 1 percent but less than 20 percent impaired, limited or restricted Mobility: Walking and Moving Around Discharge Status [phone](G8980): At least 1 percent but less than 20 percent impaired, limited or restricted    Time: 1410-1436 PT Time  Calculation (min) (ACUTE ONLY): 26 min   Charges:   PT Evaluation $PT Eval Low Complexity: 1 Procedure PT Treatments $Gait Training: 8-22 mins   PT G Codes:   PT G-Codes **NOT FOR INPATIENT CLASS** Functional Assessment Tool Used: [employer] AM-PAC "6-clicks"  Functional Limitation: Mobility: Walking and moving around Mobility: Walking and Moving Around Current Status [phone]([phone]): At least 1 percent but less than 20 percent impaired, limited or restricted Mobility: Walking and Moving Around Goal Status [phone]([id number]): At least 1 percent but less than 20 percent impaired, limited or restricted Mobility: Walking and Moving Around Discharge Status [phone]([id number]): At least 1 percent but less than 20 percent impaired, limited or restricted   Carollee HerterBeth Reia Viernes, PT, DPT X: [phone]   10/17/2015, 4:22 PM

## 2015-10-17 NOTE — Care Management Note (Addendum)
Case Management Note  Patient Details  Name: Ashley Houston MRN: [id number] Date of Birth: 08/05/1940  Subjective/Objective:   Patient is from home, lives with daughter. Patient is independent with ADL's. She drives herself to appointments. Her PCP is Dr. Loreta AveMann. [facility] and can afford her medications. She uses oxygen at home, provided by [facility].                 Action/Plan: No CM needs.    Expected Discharge Date:       10/18/2015           Expected Discharge Plan:  Home/Self Care  In-House Referral:     Discharge planning Services  CM Consult  Post Acute Care Choice:  NA Choice offered to:  NA  DME Arranged:    DME Agency:     HH Arranged:    HH Agency:     Status of Service:  Completed, signed off  Medicare Important Message Given:  Yes Date Medicare IM Given:    Medicare IM give by:    Date Additional Medicare IM Given:    Additional Medicare Important Message give by:     If discussed at Long Length of Stay Meetings, dates discussed:    Additional Comments:  Ger Ringenberg, Chrystine OilerSharley Diane, RN 10/17/2015, 3:08 PM

## 2015-10-17 NOTE — Progress Notes (Addendum)
[facility] PROGRESS NOTE  Ashley Houston WUJ:[id number] DOB: 02/01/1941 DOA: 10/16/2015   PCP: [name], PA-C     Assessment/Plan: Principal Problem:   COPD with acute exacerbation (HCC) Active Problems:   Tobacco abuse   Generalized weakness   COPD exacerbation (HCC)   Hypothyroidism   Normocytic anemia   Acute on chronic respiratory failure (HCC)   Essential hypertension   Brief summary  75 y.o. female with history of COPD, hypothyroid, depression/ anxiety who presented to ED today w fevers and chills x 3 days, worsening DOE. Coughing as well and some R sided chest pain. Chronic gray sputum production unchanged. Fevers to 102 at home. Was here April 5-8 with same symptoms (SOB/ R chest pain) and treated for CAP/ COPD flare w usual medications. Then she was admitted from 4/28 - 09/10/15 with COPD exacerbation / hypoxemia and was treated with IV abx and improved. The CXR changes R mid lung were felt to be residual from prior admission. IN ED today pt is afebril,e BP's soft 105/65, HR 70 and R 18, RA sat was 89%, up to 94% on 2L.   Assessment: 1 Acute exacerbation of COPD /Pneumonia- assoc w fevers, poss bronchitis/ sinusitis. CXR changes not new, unchanged from prior so doubt new PNA. Possibly scarring. Continue IVF, IV abx, steroids and nebs. O2 support. Not sure why having recurrent resp infections. Consider sinus imaging 2 Tobacco use  3 Depression/ anxiety-doxepin, gabapentin,  4 HTN on metoprolol 5. Hypothyroidism - cont synthroid  6.  DNR - pt request    DVT prophylaxsis lovenox   Code Status:  DNR        Family Communication: Discussed in detail with the patient, all imaging results, lab results explained to the patient   Disposition Plan:  As above     Consultants:  none  Procedures:  None   Antibiotics: Anti-infectives    Start     Dose/Rate Route Frequency Ordered Stop   10/17/15 0800  levofloxacin (LEVAQUIN) IVPB  750 mg     750 mg 100 mL/hr over 90 Minutes Intravenous Every 24 hours 10/16/15 2213     10/16/15 1830  vancomycin (VANCOCIN) IVPB 1000 mg/200 mL premix     1,000 mg 200 mL/hr over 60 Minutes Intravenous  Once 10/16/15 1827 10/16/15 2041   10/16/15 1830  piperacillin-tazobactam (ZOSYN) IVPB 3.375 g     3.375 g 100 mL/hr over 30 Minutes Intravenous  Once 10/16/15 1827 10/16/15 1919         HPI/Subjective: Patient currently afebrile,denies any shortness of breath or chest pain No fever but has a nonproductive cough  Objective: Filed Vitals:   10/16/15 2121 10/17/15 0155 10/17/15 0937 10/17/15 1040  BP: 101/55   99/50  Pulse: 84   69  Temp: 99.6 F (37.6 C)     TempSrc: Oral     Resp: 16     Height: 5\' 6"  (1.676 m)     Weight: 57.153 kg (126 lb)     SpO2: 94% 90% 90%    No intake or output data in the 24 hours ending 10/17/15 1120  Exam:  Examination:  General exam: Appears calm and comfortable  Respiratory system: Clear to auscultation. Respiratory effort normal. Cardiovascular system: S1 & S2 heard, RRR. No JVD, murmurs, rubs, gallops or clicks. No pedal edema. Gastrointestinal system: Abdomen is nondistended, soft and nontender. No organomegaly or masses felt. Normal bowel sounds heard. Central nervous system: Alert and oriented. No  focal neurological deficits. Extremities: Symmetric 5 x 5 power. Skin: No rashes, lesions or ulcers Psychiatry: Judgement and insight appear normal. Mood & affect appropriate.     Data Reviewed: I have personally reviewed following labs and imaging studies  Micro Results Recent Results (from the past 240 hour(s))  Blood Culture (routine x 2)     Status: None (Preliminary result)   Collection Time: 10/16/15  4:04 PM  Result Value Ref Range Status   Specimen Description BLOOD LEFT ANTECUBITAL  Final   Special Requests BOTTLES DRAWN AEROBIC ONLY 5CC  Final   Culture NO GROWTH < 24 HOURS  Final   Report Status PENDING  Incomplete   Blood Culture (routine x 2)     Status: None (Preliminary result)   Collection Time: 10/16/15  4:27 PM  Result Value Ref Range Status   Specimen Description BLOOD LEFT ANTECUBITAL  Final   Special Requests BOTTLES DRAWN AEROBIC AND ANAEROBIC 6CC  Final   Culture NO GROWTH < 24 HOURS  Final   Report Status PENDING  Incomplete    Radiology Reports Dg Chest 2 View  10/16/2015  CLINICAL DATA:  Fever and shortness of breath for 3 days. Right-sided chest pain. Hypoxia. Recent pneumonia. EXAM: CHEST  2 VIEW COMPARISON:  09/07/2015 FINDINGS: Changes of COPD again demonstrated. Patchy airspace disease in the right midlung, likely in the inferior aspect of the right upper lobe shows no significant change compared to previous study, and remains suspicious for pneumonia. Left lung remains clear. No evidence of pleural effusion. IMPRESSION: Patchy inferior right upper lobe airspace disease shows little or no significant change, can't remains suspicious for pneumonia. Consider continued radiographic followup or chest CT. COPD. Electronically Signed   By: Myles RosenthalJohn  Stahl M.D.   On: 10/16/2015 17:42     CBC  Recent Labs Lab 10/16/15 1604 10/17/15 0656  WBC 11.2* 15.4*  HGB 8.9* 9.7*  HCT 27.7* 30.1*  PLT 284 342  MCV 84.2 84.6  MCH 27.1 27.2  MCHC 32.1 32.2  RDW 17.7* 17.6*    Chemistries   Recent Labs Lab 10/16/15 1604  NA 135  K 4.0  CL 99*  CO2 29  GLUCOSE 133*  BUN 20  CREATININE 0.76  CALCIUM 8.9  AST 19  ALT 16  ALKPHOS 93  BILITOT 0.6   ------------------------------------------------------------------------------------------------------------------ estimated creatinine clearance is 56.6 mL/min (by C-G formula based on Cr of 0.76). ------------------------------------------------------------------------------------------------------------------ No results for input(s): HGBA1C in the last 72  hours. ------------------------------------------------------------------------------------------------------------------ No results for input(s): CHOL, HDL, LDLCALC, TRIG, CHOLHDL, LDLDIRECT in the last 72 hours. ------------------------------------------------------------------------------------------------------------------ No results for input(s): TSH, T4TOTAL, T3FREE, THYROIDAB in the last 72 hours.  Invalid input(s): FREET3 ------------------------------------------------------------------------------------------------------------------ No results for input(s): VITAMINB12, FOLATE, FERRITIN, TIBC, IRON, RETICCTPCT in the last 72 hours.  Coagulation profile No results for input(s): INR, PROTIME in the last 168 hours.  No results for input(s): DDIMER in the last 72 hours.  Cardiac Enzymes No results for input(s): CKMB, TROPONINI, MYOGLOBIN in the last 168 hours.  Invalid input(s): CK ------------------------------------------------------------------------------------------------------------------ Invalid input(s): POCBNP   CBG: No results for input(s): GLUCAP in the last 168 hours.     Studies: Dg Chest 2 View  10/16/2015  CLINICAL DATA:  Fever and shortness of breath for 3 days. Right-sided chest pain. Hypoxia. Recent pneumonia. EXAM: CHEST  2 VIEW COMPARISON:  09/07/2015 FINDINGS: Changes of COPD again demonstrated. Patchy airspace disease in the right midlung, likely in the inferior aspect of the right upper lobe shows no significant  change compared to previous study, and remains suspicious for pneumonia. Left lung remains clear. No evidence of pleural effusion. IMPRESSION: Patchy inferior right upper lobe airspace disease shows little or no significant change, can't remains suspicious for pneumonia. Consider continued radiographic followup or chest CT. COPD. Electronically Signed   By: Myles Rosenthal M.D.   On: 10/16/2015 17:42      No results found for: HGBA1C Lab Results   Component Value Date   CREATININE 0.76 10/16/2015       Scheduled Meds: . aspirin EC  81 mg Oral Daily  . docusate sodium  100 mg Oral BID  . doxepin  100 mg Oral QHS  . enoxaparin (LOVENOX) injection  40 mg Subcutaneous Q24H  . gabapentin  300 mg Oral TID  . ipratropium-albuterol  3 mL Nebulization Q6H  . levofloxacin (LEVAQUIN) IV  750 mg Intravenous Q24H  . levothyroxine  25 mcg Oral QAC breakfast  . methylPREDNISolone (SOLU-MEDROL) injection  125 mg Intravenous Once  . methylPREDNISolone (SOLU-MEDROL) injection  60 mg Intravenous Q8H  . metoprolol succinate  12.5 mg Oral Daily  . multivitamin with minerals  1 tablet Oral BID   Continuous Infusions: . 0.45 % NaCl with KCl 20 mEq / L 75 mL/hr at 10/16/15 2359     LOS: 1 day    Time spent: >30 MINS    [facility]  [facility] Pager [phone]. If 7PM-7AM, please contact night-coverage at [email or web address], password [facility] 10/17/2015, 11:20 AM  LOS: 1 day

## 2015-10-17 NOTE — Care Management Important Message (Signed)
Important Message  Patient Details  Name: Ashley Houston MRN: [id number] Date of Birth: 05/05/1941   Medicare Important Message Given:  Yes    Malcolm MetroChildress, Lashanta Elbe Demske, RN 10/17/2015, 1:30 PM

## 2015-10-18 DIAGNOSIS — R531 Weakness: Secondary | ICD-10-CM

## 2015-10-18 DIAGNOSIS — I10 Essential (primary) hypertension: Secondary | ICD-10-CM

## 2015-10-18 LAB — CBC
HEMATOCRIT: 27.1 % — AB (ref 36.0–46.0)
HEMOGLOBIN: 8.8 g/dL — AB (ref 12.0–15.0)
MCH: 27.4 pg (ref 26.0–34.0)
MCHC: 32.5 g/dL (ref 30.0–36.0)
MCV: 84.4 fL (ref 78.0–100.0)
Platelets: 332 10*3/uL (ref 150–400)
RBC: 3.21 MIL/uL — AB (ref 3.87–5.11)
RDW: 17.4 % — ABNORMAL HIGH (ref 11.5–15.5)
WBC: 14.7 10*3/uL — ABNORMAL HIGH (ref 4.0–10.5)

## 2015-10-18 LAB — COMPREHENSIVE METABOLIC PANEL
ALBUMIN: 2.8 g/dL — AB (ref 3.5–5.0)
ALK PHOS: 86 U/L (ref 38–126)
ALT: 17 U/L (ref 14–54)
AST: 17 U/L (ref 15–41)
Anion gap: 5 (ref 5–15)
BUN: 20 mg/dL (ref 6–20)
CALCIUM: 8.7 mg/dL — AB (ref 8.9–10.3)
CO2: 26 mmol/L (ref 22–32)
CREATININE: 0.52 mg/dL (ref 0.44–1.00)
Chloride: 109 mmol/L (ref 101–111)
GFR calc Af Amer: >60 mL/min (ref >60)
GFR calc non Af Amer: >60 mL/min (ref >60)
GLUCOSE: 146 mg/dL — AB (ref 65–99)
Potassium: 4.1 mmol/L (ref 3.5–5.1)
SODIUM: 140 mmol/L (ref 135–145)
Total Bilirubin: 0.2 mg/dL — ABNORMAL LOW (ref 0.3–1.2)
Total Protein: 6.6 g/dL (ref 6.5–8.1)

## 2015-10-18 LAB — URINE CULTURE

## 2015-10-18 MED ORDER — LEVOFLOXACIN 750 MG PO TABS: 750.0000 mg | ORAL_TABLET | Freq: Every day | ORAL | Status: DC

## 2015-10-18 MED ORDER — PREDNISONE 20 MG PO TABS: 40.0000 mg | ORAL_TABLET | Freq: Every day | ORAL | Status: AC

## 2015-10-18 MED ORDER — IPRATROPIUM-ALBUTEROL 0.5-2.5 (3) MG/3ML IN SOLN: 3.0000 mL | Freq: Three times a day (TID) | RESPIRATORY_TRACT | Status: DC

## 2015-10-18 MED ORDER — DOCUSATE SODIUM 100 MG PO CAPS: 100.0000 mg | ORAL_CAPSULE | Freq: Two times a day (BID) | ORAL | Status: DC

## 2015-10-18 MED ORDER — POLYETHYLENE GLYCOL 3350 17 G PO PACK: 17.0000 g | PACK | Freq: Every day | ORAL | Status: DC | PRN

## 2015-10-18 MED ORDER — IPRATROPIUM-ALBUTEROL 0.5-2.5 (3) MG/3ML IN SOLN
3.0000 mL | Freq: Three times a day (TID) | RESPIRATORY_TRACT | Status: DC
  Administered 2015-10-18: 3 mL via RESPIRATORY_TRACT
  Filled 2015-10-18 (×2): qty 3

## 2015-10-18 NOTE — Discharge Summary (Signed)
Physician Discharge Summary  Ashley Houston MRN: [id number] DOB/AGE: [birth year] 75 y.o.  PCP: Collene Mares, PA-C   Admit date: 10/16/2015 Discharge date: 10/18/2015  Discharge Diagnoses:     Principal Problem:   COPD with acute exacerbation ([place]) Active Problems:   Tobacco abuse   Generalized weakness   COPD exacerbation ([facility])   Hypothyroidism   Normocytic anemia   Acute on chronic respiratory failure ([place])   Essential hypertension    Follow-up recommendations Follow-up with PCP in 3-5 days , including all  additional recommended appointments as below Follow-up CBC, CMP in 3-5 days Set up [facility] for resp care  Patient needs outpatient pulmonary evaluation, will try to schedule with Dr. Luan Pulling in 1 week     Current Discharge Medication List    START taking these medications   Details  docusate sodium (COLACE) 100 MG capsule Take 1 capsule (100 mg total) by mouth 2 (two) times daily. Qty: 10 capsule, Refills: 0    ipratropium-albuterol (DUONEB) 0.5-2.5 (3) MG/3ML SOLN Take 3 mLs by nebulization 3 (three) times daily. Qty: 360 mL, Refills: 2    levofloxacin (LEVAQUIN) 750 MG tablet Take 1 tablet (750 mg total) by mouth daily. Qty: 5 tablet, Refills: 0    polyethylene glycol (MIRALAX / GLYCOLAX) packet Take 17 g by mouth daily as needed for mild constipation. Qty: 14 each, Refills: 0    predniSONE (DELTASONE) 20 MG tablet Take 2 tablets (40 mg total) by mouth daily with breakfast. Qty: 20 tablet, Refills: 0      CONTINUE these medications which have NOT CHANGED   Details  albuterol (PROVENTIL) (2.5 MG/3ML) 0.083% nebulizer solution Take 3 mLs by nebulization daily as needed for wheezing or shortness of breath.  Refills: 11    aspirin 81 MG tablet Take 81 mg by mouth daily.    CALCIUM PO Take 1 tablet by mouth daily.    doxepin (SINEQUAN) 100 MG capsule Take 1 capsule by mouth at bedtime.    gabapentin (NEURONTIN) 300 MG capsule Take 300 mg by mouth 3 (three)  times daily.    HYDROcodone-acetaminophen (NORCO) 10-325 MG tablet Take 1 tablet by mouth every 4 (four) hours as needed for moderate pain.     levothyroxine (SYNTHROID, LEVOTHROID) 25 MCG tablet Take 25 mcg by mouth daily before breakfast.    LORazepam (ATIVAN) 2 MG tablet Take 2 mg by mouth every 6 (six) hours as needed for anxiety.    metoprolol succinate (TOPROL-XL) 25 MG 24 hr tablet Take 0.5 mg by mouth daily.     mometasone-formoterol (DULERA) 100-5 MCG/ACT AERO Inhale 2 puffs into the lungs 2 (two) times daily. Qty: 1 g, Refills: 2    Multiple Vitamin (MULTIVITAMIN WITH MINERALS) TABS tablet Take 1 tablet by mouth 2 (two) times daily.    SSD 1 % cream APPLY CREAM TO AFFECTED AREA(S) FOUR TIMES DAILY Refills: 1    tiotropium (SPIRIVA) 18 MCG inhalation capsule Place 1 capsule (18 mcg total) into inhaler and inhale daily. Qty: 30 capsule, Refills: 12    ferrous gluconate (FERGON) 324 MG tablet Take 324 mg by mouth daily with breakfast.      STOP taking these medications     azithromycin (ZITHROMAX) 250 MG tablet          Discharge Condition: * Stable  Discharge Instructions Get Medicines reviewed and adjusted: Please take all your medications with you for your next visit with your Primary MD  Please request your Primary MD to go over all  hospital tests and procedure/radiological results at the follow up, please ask your Primary MD to get all Hospital records sent to his/her office.  If you experience worsening of your admission symptoms, develop shortness of breath, life threatening emergency, suicidal or homicidal thoughts you must seek medical attention immediately by calling 911 or calling your MD immediately if symptoms less severe.  You must read complete instructions/literature along with all the possible adverse reactions/side effects for all the Medicines you take and that have been prescribed to you. Take any new Medicines after you have completely  understood and accpet all the possible adverse reactions/side effects.   Do not drive when taking Pain medications.   Do not take more than prescribed Pain, Sleep and Anxiety Medications  Special Instructions: If you have smoked or chewed Tobacco in the last 2 yrs please stop smoking, stop any regular Alcohol and or any Recreational drug use.  Wear Seat belts while driving.  Please note  You were cared for by a hospitalist during your hospital stay. Once you are discharged, your primary care physician will handle any further medical issues. Please note that NO REFILLS for any discharge medications will be authorized once you are discharged, as it is imperative that you return to your primary care physician (or establish a relationship with a primary care physician if you do not have one) for your aftercare needs so that they can reassess your need for medications and monitor your lab values.  Discharge Instructions    Diet - low sodium heart healthy    Complete by:  As directed      Increase activity slowly    Complete by:  As directed             Allergies  Allergen Reactions  . Lipitor [Atorvastatin] Itching      Disposition: 01-Home or Self Care   Consults:  None     Significant Diagnostic Studies:  Dg Chest 2 View  10/16/2015  CLINICAL DATA:  Fever and shortness of breath for 3 days. Right-sided chest pain. Hypoxia. Recent pneumonia. EXAM: CHEST  2 VIEW COMPARISON:  09/07/2015 FINDINGS: Changes of COPD again demonstrated. Patchy airspace disease in the right midlung, likely in the inferior aspect of the right upper lobe shows no significant change compared to previous study, and remains suspicious for pneumonia. Left lung remains clear. No evidence of pleural effusion. IMPRESSION: Patchy inferior right upper lobe airspace disease shows little or no significant change, can't remains suspicious for pneumonia. Consider continued radiographic followup or chest CT. COPD.  Electronically Signed   By: Earle Gell M.D.   On: 10/16/2015 17:42     Filed Weights   10/16/15 1550 10/16/15 1756 10/16/15 2121  Weight: 57.153 kg (126 lb) 56.654 kg (124 lb 14.4 oz) 57.153 kg (126 lb)     Microbiology: Recent Results (from the past 240 hour(s))  Blood Culture (routine x 2)     Status: None (Preliminary result)   Collection Time: 10/16/15  4:04 PM  Result Value Ref Range Status   Specimen Description BLOOD LEFT ANTECUBITAL  Final   Special Requests BOTTLES DRAWN AEROBIC ONLY 5CC  Final   Culture NO GROWTH 2 DAYS  Final   Report Status PENDING  Incomplete  Blood Culture (routine x 2)     Status: None (Preliminary result)   Collection Time: 10/16/15  4:27 PM  Result Value Ref Range Status   Specimen Description BLOOD LEFT ANTECUBITAL  Final   Special Requests BOTTLES DRAWN  AEROBIC AND ANAEROBIC 6CC  Final   Culture NO GROWTH 2 DAYS  Final   Report Status PENDING  Incomplete  Urine culture     Status: Abnormal   Collection Time: 10/16/15  6:18 PM  Result Value Ref Range Status   Specimen Description URINE, CLEAN CATCH  Final   Special Requests NONE  Final   Culture MULTIPLE SPECIES PRESENT, SUGGEST RECOLLECTION (A)  Final   Report Status 10/18/2015 FINAL  Final       Blood Culture    Component Value Date/Time   SDES URINE, CLEAN CATCH 10/16/2015 1818   SPECREQUEST NONE 10/16/2015 1818   CULT MULTIPLE SPECIES PRESENT, SUGGEST RECOLLECTION* 10/16/2015 1818   REPTSTATUS 10/18/2015 FINAL 10/16/2015 1818      Labs: Results for orders placed or performed during the hospital encounter of 10/16/15 (from the past 48 hour(s))  Basic metabolic panel     Status: Abnormal   Collection Time: 10/16/15  4:04 PM  Result Value Ref Range   Sodium 135 135 - 145 mmol/L   Potassium 4.0 3.5 - 5.1 mmol/L   Chloride 99 (L) 101 - 111 mmol/L   CO2 29 22 - 32 mmol/L   Glucose, Bld 133 (H) 65 - 99 mg/dL   BUN 20 6 - 20 mg/dL   Creatinine, Ser 0.76 0.44 - 1.00 mg/dL    Calcium 8.9 8.9 - 10.3 mg/dL   GFR calc non Af Amer >60 >60 mL/min   GFR calc Af Amer >60 >60 mL/min    Comment: (NOTE) The eGFR has been calculated using the CKD EPI equation. This calculation has not been validated in all clinical situations. eGFR's persistently <60 mL/min signify possible Chronic Kidney Disease.    Anion gap 7 5 - 15  CBC     Status: Abnormal   Collection Time: 10/16/15  4:04 PM  Result Value Ref Range   WBC 11.2 (H) 4.0 - 10.5 K/uL   RBC 3.29 (L) 3.87 - 5.11 MIL/uL   Hemoglobin 8.9 (L) 12.0 - 15.0 g/dL   HCT 27.7 (L) 36.0 - 46.0 %   MCV 84.2 78.0 - 100.0 fL   MCH 27.1 26.0 - 34.0 pg   MCHC 32.1 30.0 - 36.0 g/dL   RDW 17.7 (H) 11.5 - 15.5 %   Platelets 284 150 - 400 K/uL  Blood Culture (routine x 2)     Status: None (Preliminary result)   Collection Time: 10/16/15  4:04 PM  Result Value Ref Range   Specimen Description BLOOD LEFT ANTECUBITAL    Special Requests BOTTLES DRAWN AEROBIC ONLY 5CC    Culture NO GROWTH 2 DAYS    Report Status PENDING   Hepatic function panel     Status: Abnormal   Collection Time: 10/16/15  4:04 PM  Result Value Ref Range   Total Protein 6.9 6.5 - 8.1 g/dL   Albumin 3.2 (L) 3.5 - 5.0 g/dL   AST 19 15 - 41 U/L   ALT 16 14 - 54 U/L   Alkaline Phosphatase 93 38 - 126 U/L   Total Bilirubin 0.6 0.3 - 1.2 mg/dL   Bilirubin, Direct 0.2 0.1 - 0.5 mg/dL   Indirect Bilirubin 0.4 0.3 - 0.9 mg/dL  Blood Culture (routine x 2)     Status: None (Preliminary result)   Collection Time: 10/16/15  4:27 PM  Result Value Ref Range   Specimen Description BLOOD LEFT ANTECUBITAL    Special Requests BOTTLES DRAWN AEROBIC AND ANAEROBIC 6CC  Culture NO GROWTH 2 DAYS    Report Status PENDING   I-Stat CG4 Lactic Acid, ED  (not at  [facility])     Status: None   Collection Time: 10/16/15  5:17 PM  Result Value Ref Range   Lactic Acid, Venous 1.26 0.5 - 2.0 mmol/L  Urinalysis, Routine w reflex microscopic (not at [facility])     Status: Abnormal   Collection  Time: 10/16/15  6:18 PM  Result Value Ref Range   Color, Urine YELLOW YELLOW   APPearance CLEAR CLEAR   Specific Gravity, Urine 1.010 1.005 - 1.030   pH 5.5 5.0 - 8.0   Glucose, UA NEGATIVE NEGATIVE mg/dL   Hgb urine dipstick NEGATIVE NEGATIVE   Bilirubin Urine NEGATIVE NEGATIVE   Ketones, ur NEGATIVE NEGATIVE mg/dL   Protein, ur NEGATIVE NEGATIVE mg/dL   Nitrite NEGATIVE NEGATIVE   Leukocytes, UA TRACE (A) NEGATIVE  Urine culture     Status: Abnormal   Collection Time: 10/16/15  6:18 PM  Result Value Ref Range   Specimen Description URINE, CLEAN CATCH    Special Requests NONE    Culture MULTIPLE SPECIES PRESENT, SUGGEST RECOLLECTION (A)    Report Status 10/18/2015 FINAL   Urine microscopic-add on     Status: Abnormal   Collection Time: 10/16/15  6:18 PM  Result Value Ref Range   Squamous Epithelial / LPF 0-5 (A) NONE SEEN   WBC, UA 0-5 0 - 5 WBC/hpf   RBC / HPF NONE SEEN 0 - 5 RBC/hpf   Bacteria, UA RARE (A) NONE SEEN  I-Stat CG4 Lactic Acid, ED  (not at  [facility])     Status: None   Collection Time: 10/16/15  7:38 PM  Result Value Ref Range   Lactic Acid, Venous 0.87 0.5 - 2.0 mmol/L  CBC     Status: Abnormal   Collection Time: 10/17/15  6:56 AM  Result Value Ref Range   WBC 15.4 (H) 4.0 - 10.5 K/uL   RBC 3.56 (L) 3.87 - 5.11 MIL/uL   Hemoglobin 9.7 (L) 12.0 - 15.0 g/dL   HCT 30.1 (L) 36.0 - 46.0 %   MCV 84.6 78.0 - 100.0 fL   MCH 27.2 26.0 - 34.0 pg   MCHC 32.2 30.0 - 36.0 g/dL   RDW 17.6 (H) 11.5 - 15.5 %   Platelets 342 150 - 400 K/uL  CBC     Status: Abnormal   Collection Time: 10/18/15  4:53 AM  Result Value Ref Range   WBC 14.7 (H) 4.0 - 10.5 K/uL   RBC 3.21 (L) 3.87 - 5.11 MIL/uL   Hemoglobin 8.8 (L) 12.0 - 15.0 g/dL   HCT 27.1 (L) 36.0 - 46.0 %   MCV 84.4 78.0 - 100.0 fL   MCH 27.4 26.0 - 34.0 pg   MCHC 32.5 30.0 - 36.0 g/dL   RDW 17.4 (H) 11.5 - 15.5 %   Platelets 332 150 - 400 K/uL  Comprehensive metabolic panel     Status: Abnormal   Collection Time:  10/18/15  4:53 AM  Result Value Ref Range   Sodium 140 135 - 145 mmol/L   Potassium 4.1 3.5 - 5.1 mmol/L   Chloride 109 101 - 111 mmol/L   CO2 26 22 - 32 mmol/L   Glucose, Bld 146 (H) 65 - 99 mg/dL   BUN 20 6 - 20 mg/dL   Creatinine, Ser 0.52 0.44 - 1.00 mg/dL   Calcium 8.7 (L) 8.9 - 10.3 mg/dL   Total Protein  6.6 6.5 - 8.1 g/dL   Albumin 2.8 (L) 3.5 - 5.0 g/dL   AST 17 15 - 41 U/L   ALT 17 14 - 54 U/L   Alkaline Phosphatase 86 38 - 126 U/L   Total Bilirubin 0.2 (L) 0.3 - 1.2 mg/dL   GFR calc non Af Amer >60 >60 mL/min   GFR calc Af Amer >60 >60 mL/min    Comment: (NOTE) The eGFR has been calculated using the CKD EPI equation. This calculation has not been validated in all clinical situations. eGFR's persistently <60 mL/min signify possible Chronic Kidney Disease.    Anion gap 5 5 - 15     Lipid Panel  No results found for: CHOL, TRIG, HDL, CHOLHDL, VLDL, LDLCALC, LDLDIRECT   No results found for: HGBA1C   Lab Results  Component Value Date   CREATININE 0.52 10/18/2015    75 y.o. female with history of COPD, hypothyroid, depression/ anxiety who presented to ED today w fevers and chills x 3 days, worsening DOE. Coughing as well and some R sided chest pain. Chronic gray sputum production unchanged. Fevers to 102 at home. Was here April 5-8 with same symptoms (SOB/ R chest pain) and treated for CAP/ COPD flare w usual medications. Then she was admitted from 4/28 - 09/10/15 with COPD exacerbation /   CAP and diastolic heart failure     CXR  This admission showed changes in R mid lung were felt to be residual from prior admission. IN ED today pt is afebril,e BP's soft 105/65, HR 70 and R 18, RA sat was 89%, up to 94% on 2L.   Assessment:   Acute exacerbation of COPD /HCAP - assoc w fevers, poss bronchitis/ sinusitis. CXR changes not new, unchanged from prior so doubt new PNA. Possibly scarring.treated with  IVF, IV abx, steroids and nebs. home oxygen dependent . Not  sure why having recurrent resp infections. Patient received 1 dose of vancomycin and Zosyn. Subsequently switched to levofloxacin 750 mg once a day for 5 more days. Patient would benefit from outpatient pulmonary evaluation for recurrent exacerbations of COPD.  Chronic diastolic heart failure.  Patient's echo on 08/15/2015 revealed an EF of 55-60% and grade 2 diastolic dysfunction. Her heart failure appeared to have been compensated.  Steroid-induced hyperglycemia. Patient was started on sliding-scale NovoLog for elevated CBGs on IV Solu-Medrol. Her CBGs were relatively controlled   Tobacco use    Depression/ anxiety-doxepin, gabapentin,    HTN on metoprolol  Hypothyroidism. Synthroid was restarted. Her TSH was within normal limits on 08/15/15.    DNR - pt request    Discharge Exam:    Blood pressure 103/62, pulse 66, temperature 98.2 F (36.8 C), temperature source Oral, resp. rate 18, height 5' 6"  (1.676 m), weight 57.153 kg (126 lb), SpO2 97 %.   General exam: Appears calm and comfortable  Respiratory system: Clear to auscultation. Respiratory effort normal. Cardiovascular system: S1 & S2 heard, RRR. No JVD, murmurs, rubs, gallops or clicks. No pedal edema. Gastrointestinal system: Abdomen is nondistended, soft and nontender. No organomegaly or masses felt. Normal bowel sounds heard. Central nervous system: Alert and oriented. No focal neurological deficits. Extremities: Symmetric 5 x 5 power. Skin: No rashes, lesions or ulcers Psychiatry: Judgement and insight appear normal. Mood & affect appropriate.    Follow-up Information    Follow up with [facility], BENJAMIN, PA-C. Schedule an appointment as soon as possible for a visit in 3 days.   Specialties:  Physician Assistant, Internal  Medicine   Contact information:   [address] [place] [address] [phone]       Signed: Reyne Dumas 10/18/2015, 10:22 AM        Time spent >45 mins

## 2015-10-18 NOTE — Progress Notes (Signed)
Patient is alert and oriented, vital signs are stable, discharge instructions reviewed with patient, patient to follow up with pulmonary specialist, prescriptions sent to drug store, iv removed Stanford BreedBracey, Shakyra Mattera N RN 10-18-2015 1:28 PM 10-18-2015

## 2015-10-18 NOTE — Consult Note (Signed)
   [facility] Consult   10/18/2015  Jule Economyhyllis Leise 07/21/1940 [id number]  [facility] for chronic disease management services.  Patient has been engaged by a [employer] Community Care Coordinator.  Our community based plan of care has focused on disease management and community resource support.  Patient will receive a post discharge transition of care call and will be evaluated for monthly home visits for assessments and disease process education.  Made Inpatient Case Manager aware that [facility] following. Of note, [facility] Care Management services does not replace or interfere with any services that are arranged by inpatient case management or social work.   For additional questions or referrals please contact:   Alben SpittleMary E. Albertha GheeNiemczura, RN, BSN, [facility]  Hospital Liaison [facility] [phone]) Business Cell  [phone]) Toll Free Office

## 2015-10-18 NOTE — Care Management Note (Signed)
Case Management Note  Patient Details  Name: Jule Economyhyllis Pauli MRN: [id number] Date of Birth: 05/21/1940   Expected Discharge Date:     10/18/2015             Expected Discharge Plan:  Home/Self Care  In-House Referral:    N/A  Discharge planning Services  CM Consult  Post Acute Care Choice:  NA Choice offered to:  NA  DME Arranged:    DME Agency:     HH Arranged:    HH Agency:     Status of Service:  Completed, signed off  Medicare Important Message Given:  Yes Date Medicare IM Given:    Medicare IM give by:    Date Additional Medicare IM Given:    Additional Medicare Important Message give by:     If discussed at Long Length of Stay Meetings, dates discussed:    Additional Comments: Pt discharging home today with self care. Pt has supplemental Oxygen at home, states she does not have a tank to get home with, CM offered to [facility] to deliver port tank prior to DC, pt refuses stating she goes without it often, has her rescue inhaler if she needs it and only lives a short distance away.   Malcolm Metrohildress, Amy Belloso Demske, RN 10/18/2015, 11:03 AM

## 2015-10-19 ENCOUNTER — Other Ambulatory Visit: Payer: Self-pay | Admitting: *Deleted

## 2015-10-19 NOTE — Patient Outreach (Signed)
10/19/15- Telephone call to patient for transition of care week 1, no answer to telephone, left voicemail requesting return phone call.  ([facility] and cancelled today's home visit citing she is tired and does not feel like talking and would like to reschedule visit).  Irving ShowsJulie Farmer [facility], BSN [facility] Community Care Coordinator [phone]

## 2015-10-21 LAB — CULTURE, BLOOD (ROUTINE X 2)
CULTURE: NO GROWTH
Culture: NO GROWTH

## 2015-10-22 ENCOUNTER — Other Ambulatory Visit: Payer: Self-pay | Admitting: *Deleted

## 2015-10-22 NOTE — Patient Outreach (Signed)
10/22/15- Telephone call for transition of care week 1 (2nd attempt), no answer to telephone and no option to leave voicemail.  Irving ShowsJulie Farmer [facility], BSN [facility] Community Care Coordinator [phone]

## 2015-10-23 ENCOUNTER — Other Ambulatory Visit: Payer: Self-pay | Admitting: *Deleted

## 2015-10-23 ENCOUNTER — Encounter: Payer: Self-pay | Admitting: *Deleted

## 2015-10-23 DIAGNOSIS — Z6820 Body mass index (BMI) 20.0-20.9, adult: Secondary | ICD-10-CM | POA: Diagnosis not present

## 2015-10-23 DIAGNOSIS — D649 Anemia, unspecified: Secondary | ICD-10-CM | POA: Diagnosis not present

## 2015-10-23 DIAGNOSIS — Z1389 Encounter for screening for other disorder: Secondary | ICD-10-CM | POA: Diagnosis not present

## 2015-10-23 DIAGNOSIS — J441 Chronic obstructive pulmonary disease with (acute) exacerbation: Secondary | ICD-10-CM | POA: Diagnosis not present

## 2015-10-23 DIAGNOSIS — D72829 Elevated white blood cell count, unspecified: Secondary | ICD-10-CM | POA: Diagnosis not present

## 2015-10-23 NOTE — Patient Outreach (Signed)
10/23/15- Telephone call to patient for transition of care week 1 (3rd attempt), received identified voicemail and left message requesting return phone call.  RN CM mailed letter to pt home and will keep case open for 10 days.  Irving ShowsJulie Rossetta Kama [facility], BSN [facility] Community Care Coordinator [phone]

## 2015-11-02 ENCOUNTER — Other Ambulatory Visit: Payer: Self-pay | Admitting: *Deleted

## 2015-11-02 ENCOUNTER — Encounter: Payer: Self-pay | Admitting: *Deleted

## 2015-11-02 NOTE — Patient Outreach (Signed)
11/02/15- Case closed, unable to maintain contact with pt.  RN CM faxed case closure letter to primary provider Lenise HeraldBenjamin  [facility], mailed case closure letter to pt home.  Irving ShowsJulie Davina Howlett [facility], BSN [facility] Community Care Coordinator [phone]

## 2015-11-05 DIAGNOSIS — D72829 Elevated white blood cell count, unspecified: Secondary | ICD-10-CM | POA: Diagnosis not present

## 2015-11-17 DIAGNOSIS — I5040 Unspecified combined systolic (congestive) and diastolic (congestive) heart failure: Secondary | ICD-10-CM | POA: Diagnosis not present

## 2015-11-17 DIAGNOSIS — J449 Chronic obstructive pulmonary disease, unspecified: Secondary | ICD-10-CM | POA: Diagnosis not present

## 2015-11-24 ENCOUNTER — Emergency Department (HOSPITAL_COMMUNITY): Payer: Commercial Managed Care - HMO

## 2015-11-24 ENCOUNTER — Encounter (HOSPITAL_COMMUNITY): Payer: Self-pay

## 2015-11-24 ENCOUNTER — Emergency Department (HOSPITAL_COMMUNITY)
Admission: EM | Admit: 2015-11-24 | Discharge: 2015-11-24 | Disposition: A | Discharge status: Home / Self Care | Payer: Commercial Managed Care - HMO | Attending: Emergency Medicine | Admitting: Emergency Medicine

## 2015-11-24 DIAGNOSIS — M5441 Lumbago with sciatica, right side: Secondary | ICD-10-CM | POA: Diagnosis not present

## 2015-11-24 DIAGNOSIS — I5032 Chronic diastolic (congestive) heart failure: Secondary | ICD-10-CM | POA: Diagnosis not present

## 2015-11-24 DIAGNOSIS — M545 Low back pain: Secondary | ICD-10-CM | POA: Diagnosis not present

## 2015-11-24 DIAGNOSIS — F329 Major depressive disorder, single episode, unspecified: Secondary | ICD-10-CM | POA: Diagnosis not present

## 2015-11-24 DIAGNOSIS — M25551 Pain in right hip: Secondary | ICD-10-CM | POA: Insufficient documentation

## 2015-11-24 DIAGNOSIS — E039 Hypothyroidism, unspecified: Secondary | ICD-10-CM | POA: Diagnosis not present

## 2015-11-24 DIAGNOSIS — M62830 Muscle spasm of back: Secondary | ICD-10-CM | POA: Insufficient documentation

## 2015-11-24 DIAGNOSIS — J449 Chronic obstructive pulmonary disease, unspecified: Secondary | ICD-10-CM | POA: Diagnosis not present

## 2015-11-24 DIAGNOSIS — Z7982 Long term (current) use of aspirin: Secondary | ICD-10-CM | POA: Diagnosis not present

## 2015-11-24 DIAGNOSIS — M62838 Other muscle spasm: Secondary | ICD-10-CM

## 2015-11-24 DIAGNOSIS — F17210 Nicotine dependence, cigarettes, uncomplicated: Secondary | ICD-10-CM | POA: Insufficient documentation

## 2015-11-24 DIAGNOSIS — R079 Chest pain, unspecified: Secondary | ICD-10-CM | POA: Diagnosis not present

## 2015-11-24 MED ORDER — DEXAMETHASONE SODIUM PHOSPHATE 10 MG/ML IJ SOLN
10.0000 mg | Freq: Once | INTRAMUSCULAR | Status: AC
  Administered 2015-11-24: 10 mg via INTRAMUSCULAR
  Filled 2015-11-24: qty 1

## 2015-11-24 MED ORDER — MORPHINE SULFATE (PF) 4 MG/ML IV SOLN
4.0000 mg | Freq: Once | INTRAVENOUS | Status: AC
  Administered 2015-11-24: 4 mg via INTRAMUSCULAR
  Filled 2015-11-24: qty 1

## 2015-11-24 MED ORDER — DIAZEPAM 5 MG PO TABS: 5.0000 mg | ORAL_TABLET | Freq: Two times a day (BID) | ORAL | Status: DC

## 2015-11-24 MED ORDER — DIAZEPAM 5 MG PO TABS
5.0000 mg | ORAL_TABLET | Freq: Once | ORAL | Status: AC
  Administered 2015-11-24: 5 mg via ORAL
  Filled 2015-11-24: qty 1

## 2015-11-24 MED ORDER — PREDNISONE 10 MG (21) PO TBPK: 10.0000 mg | ORAL_TABLET | Freq: Every day | ORAL | Status: DC

## 2015-11-24 NOTE — ED Provider Notes (Signed)
CSN: [id number]     Arrival date & time 11/24/15  1945 History   First MD Initiated Contact with Patient 11/24/15 2001     Chief Complaint  Patient presents with  . Back Pain  . Hip Pain   Pt is a 75 yo wf with a hx of COPD and chronic pain who was moving some things in her house, then developed pain in her back, ribs, and hip.  Pt takes Hydrocodone 10 and that was not helping her pain.  Pt is able to ambulate and denies any bowel or bladder difficulty.  She denies any numbness.  (Consider location/radiation/quality/duration/timing/severity/associated sxs/prior Treatment) Patient is a 75 y.o. female presenting with back pain and hip pain. [name].  Back Pain Location:  Lumbar spine Quality:  Aching Radiates to:  R thigh Pain severity:  Moderate Pain is:  Same all the time Onset quality:  Sudden Timing:  Constant Progression:  Worsening Context: lifting heavy objects   Relieved by:  Nothing Ineffective treatments:  Narcotics Hip Pain    Past Medical History  Diagnosis Date  . COPD (chronic obstructive pulmonary disease) (HCC)   . Tachycardia   . Thyroid disease   . Anxiety   . Chronic pain     hips back and shoulders  . Depression   . Chronic diastolic heart failure (HCC)   . Hypothyroidism 09/08/2015   Past Surgical History  Procedure Laterality Date  . Abdominal hysterectomy    . Tonsillectomy    . Cholecystectomy     No family history on file. Social History  Substance Use Topics  . Smoking status: Current Every Day Smoker -- 0.50 packs/day    Types: Cigarettes  . Smokeless tobacco: Never Used  . Alcohol Use: No   OB History    Gravida Para Term Preterm AB TAB SAB Ectopic Multiple Living   5 5 5       5      Review of Systems  Musculoskeletal: Positive for back pain.       Right chest wall and hip pain  All other systems reviewed and are negative.     Allergies  Lipitor  Home Medications   Prior to Admission  medications   Medication Sig Start Date End Date Taking? Authorizing Provider  albuterol (PROVENTIL) (2.5 MG/3ML) 0.083% nebulizer solution Take 3 mLs by nebulization daily as needed for wheezing or shortness of breath.  08/04/15   Historical Provider, MD  aspirin 81 MG tablet Take 81 mg by mouth daily.    Historical Provider, MD  CALCIUM PO Take 1 tablet by mouth daily.    Historical Provider, MD  diazepam (VALIUM) 5 MG tablet Take 1 tablet (5 mg total) by mouth 2 (two) times daily. 11/24/15   [name], MD  docusate sodium (COLACE) 100 MG capsule Take 1 capsule (100 mg total) by mouth 2 (two) times daily. 10/18/15   Richarda Overlie, MD  doxepin (SINEQUAN) 100 MG capsule Take 1 capsule by mouth at bedtime. 07/31/15   Historical Provider, MD  ferrous gluconate (FERGON) 324 MG tablet Take 324 mg by mouth daily with breakfast.    Historical Provider, MD  gabapentin (NEURONTIN) 300 MG capsule Take 300 mg by mouth 3 (three) times daily.    Historical Provider, MD  HYDROcodone-acetaminophen (NORCO) 10-325 MG tablet Take 1 tablet by mouth every 4 (four) hours as needed for moderate pain.     Historical Provider, MD  ipratropium-albuterol (DUONEB) 0.5-2.5 (3) MG/3ML  SOLN Take 3 mLs by nebulization 3 (three) times daily. 10/18/15   Richarda Overlie, MD  levofloxacin (LEVAQUIN) 750 MG tablet Take 1 tablet (750 mg total) by mouth daily. 10/18/15   Richarda Overlie, MD  levothyroxine (SYNTHROID, LEVOTHROID) 25 MCG tablet Take 25 mcg by mouth daily before breakfast.    Historical Provider, MD  LORazepam (ATIVAN) 2 MG tablet Take 2 mg by mouth every 6 (six) hours as needed for anxiety.    Historical Provider, MD  metoprolol succinate (TOPROL-XL) 25 MG 24 hr tablet Take 0.5 mg by mouth daily.  08/30/15   Historical Provider, MD  mometasone-formoterol (DULERA) 100-5 MCG/ACT AERO Inhale 2 puffs into the lungs 2 (two) times daily. 09/10/15   Elliot Cousin, MD  Multiple Vitamin (MULTIVITAMIN WITH MINERALS) TABS tablet Take 1 tablet by  mouth 2 (two) times daily.    Historical Provider, MD  polyethylene glycol (MIRALAX / GLYCOLAX) packet Take 17 g by mouth daily as needed for mild constipation. 10/18/15   Richarda Overlie, MD  predniSONE (STERAPRED UNI-PAK 21 TAB) 10 MG (21) TBPK tablet Take 1 tablet (10 mg total) by mouth daily. Take 6 tabs by mouth daily  for 2 days, then 5 tabs for 2 days, then 4 tabs for 2 days, then 3 tabs for 2 days, 2 tabs for 2 days, then 1 tab by mouth daily for 2 days 11/24/15   [name], MD  SSD 1 % cream APPLY CREAM TO AFFECTED AREA(S) FOUR TIMES DAILY 10/15/15   Historical Provider, MD  tiotropium (SPIRIVA) 18 MCG inhalation capsule Place 1 capsule (18 mcg total) into inhaler and inhale daily. 09/10/15   Elliot Cousin, MD   BP 122/57 mmHg  Pulse 93  Temp(Src) 98.9 F (37.2 C) (Oral)  Resp 24  Ht  (1.676 m)  Wt 123 lb (55.792 kg)  BMI 19.86 kg/m2  SpO2 92% Physical Exam  Constitutional: She is oriented to person, place, and time. She appears well-developed and well-nourished.  HENT:  Head: Normocephalic and atraumatic.  Right Ear: External ear normal.  Left Ear: External ear normal.  Nose: Nose normal.  Mouth/Throat: Oropharynx is clear and moist.  Eyes: Conjunctivae and EOM are normal. Pupils are equal, round, and reactive to light.  Neck: Normal range of motion. Neck supple.  Cardiovascular: Normal rate, regular rhythm, normal heart sounds and intact distal pulses.   Pulmonary/Chest: Effort normal. She has wheezes.  Abdominal: Soft. Bowel sounds are normal.  Musculoskeletal: Normal range of motion.       Arms: Neurological: She is alert and oriented to person, place, and time.  Skin: Skin is warm and dry.  Psychiatric: She has a normal mood and affect. Her behavior is normal. Judgment and thought content normal.  Nursing note and vitals reviewed.   ED Course  Procedures (including critical care time) Labs Review Labs Reviewed - No data to display  Imaging Review Dg Chest 2  View  11/24/2015  CLINICAL DATA:  75 year old female with right rib and chest pain today. EXAM: CHEST  2 VIEW COMPARISON:  10/16/2015 and prior chest radiographs. FINDINGS: Mild cardiomegaly and COPD/ emphysema again noted. There is no evidence of focal airspace disease, pulmonary edema, suspicious pulmonary nodule/mass, pleural effusion, or pneumothorax. No acute bony abnormalities are identified. IMPRESSION: No evidence of acute cardiopulmonary disease. Cardiomegaly and COPD/emphysema. Electronically Signed   By: Harmon Pier M.D.   On: 11/24/2015 21:07   Dg Lumbar Spine Complete  11/24/2015  CLINICAL DATA:  75 year old the with  lumbar spine pain today. Initial encounter. EXAM: LUMBAR SPINE - COMPLETE 4+ VIEW COMPARISON:  08/25/2014 and prior exams FINDINGS: There is no evidence of fracture or subluxation. No focal bony lesions or definite spondylolysis noted. Mild degenerative disc disease and spondylosis in the lower lumbar spine noted. IMPRESSION: No evidence of acute abnormality. Electronically Signed   By: Harmon PierJeffrey  Hu M.D.   On: 11/24/2015 21:11   Dg Hip Unilat With Pelvis 2-3 Views Right  11/24/2015  CLINICAL DATA:  75 year old female with right hip pain. EXAM: DG HIP (WITH OR WITHOUT PELVIS) 2-3V RIGHT COMPARISON:  CT of the abdomen pelvis dated 02/05/2015. FINDINGS: There is a focal area of cortical discontinue the along the lateral aspect of the right femoral neck which may be chronic. A nondisplaced femoral neck fracture is not excluded. Clinical correlation recommended. CT may provide better evaluation if there is high clinical concern for acute fracture. There is no dislocation. The bones are osteopenic. The soft tissues appear unremarkable. IMPRESSION: Chronic changes versus less likely a nondisplaced fracture of the right femoral neck. Clinical correlation is recommended. CT may provide better evaluation if clinically indicated. Electronically Signed   By: Elgie CollardArash  Radparvar M.D.   On: 11/24/2015  20:59  (Clinically, pt does not have a femoral neck fracture.  She is able to walk and her pain is more over her iliac crest and back) I have personally reviewed and evaluated these images and lab results as part of my medical decision-making.   EKG Interpretation None      MDM  Pt is feeling better after pain control.  She c/o a lot of muscle spasm, so I will give pt a rx for valium.  Pt knows to f/u with pcp.  She is instr to return if worse.  Final diagnoses:  Muscle spasm  Acute back pain with sciatica, right      Jacalyn LefevreJulie Joyell Emami, MD 11/24/15 2127

## 2015-11-24 NOTE — ED Notes (Signed)
I was moving some things and then I started hurting in my back, ribs, and hip on the right side per pt.

## 2015-12-18 DIAGNOSIS — I5040 Unspecified combined systolic (congestive) and diastolic (congestive) heart failure: Secondary | ICD-10-CM | POA: Diagnosis not present

## 2015-12-18 DIAGNOSIS — J449 Chronic obstructive pulmonary disease, unspecified: Secondary | ICD-10-CM | POA: Diagnosis not present

## 2016-05-19 DIAGNOSIS — I5040 Unspecified combined systolic (congestive) and diastolic (congestive) heart failure: Secondary | ICD-10-CM | POA: Diagnosis not present

## 2016-05-19 DIAGNOSIS — J449 Chronic obstructive pulmonary disease, unspecified: Secondary | ICD-10-CM | POA: Diagnosis not present

## 2016-06-02 DIAGNOSIS — M545 Low back pain: Secondary | ICD-10-CM | POA: Diagnosis not present

## 2016-06-02 DIAGNOSIS — J0101 Acute recurrent maxillary sinusitis: Secondary | ICD-10-CM | POA: Diagnosis not present

## 2016-06-02 DIAGNOSIS — Z6820 Body mass index (BMI) 20.0-20.9, adult: Secondary | ICD-10-CM | POA: Diagnosis not present

## 2016-06-19 DIAGNOSIS — J449 Chronic obstructive pulmonary disease, unspecified: Secondary | ICD-10-CM | POA: Diagnosis not present

## 2016-06-19 DIAGNOSIS — I5040 Unspecified combined systolic (congestive) and diastolic (congestive) heart failure: Secondary | ICD-10-CM | POA: Diagnosis not present

## 2016-07-01 ENCOUNTER — Telehealth: Payer: Self-pay

## 2016-07-01 NOTE — Telephone Encounter (Signed)
error 

## 2016-07-03 DIAGNOSIS — F411 Generalized anxiety disorder: Secondary | ICD-10-CM | POA: Diagnosis not present

## 2016-07-03 DIAGNOSIS — I10 Essential (primary) hypertension: Secondary | ICD-10-CM | POA: Diagnosis not present

## 2016-07-03 DIAGNOSIS — M545 Low back pain: Secondary | ICD-10-CM | POA: Diagnosis not present

## 2016-07-03 DIAGNOSIS — M25562 Pain in left knee: Secondary | ICD-10-CM | POA: Diagnosis not present

## 2016-07-03 DIAGNOSIS — Z6821 Body mass index (BMI) 21.0-21.9, adult: Secondary | ICD-10-CM | POA: Diagnosis not present

## 2016-07-03 DIAGNOSIS — E782 Mixed hyperlipidemia: Secondary | ICD-10-CM | POA: Diagnosis not present

## 2016-07-17 DIAGNOSIS — J449 Chronic obstructive pulmonary disease, unspecified: Secondary | ICD-10-CM | POA: Diagnosis not present

## 2016-07-17 DIAGNOSIS — I5040 Unspecified combined systolic (congestive) and diastolic (congestive) heart failure: Secondary | ICD-10-CM | POA: Diagnosis not present

## 2016-08-17 DIAGNOSIS — J449 Chronic obstructive pulmonary disease, unspecified: Secondary | ICD-10-CM | POA: Diagnosis not present

## 2016-08-17 DIAGNOSIS — I5040 Unspecified combined systolic (congestive) and diastolic (congestive) heart failure: Secondary | ICD-10-CM | POA: Diagnosis not present

## 2016-08-20 ENCOUNTER — Encounter
Payer: Commercial Managed Care - HMO | Attending: Physical Medicine & Rehabilitation | Admitting: Physical Medicine & Rehabilitation

## 2016-08-20 ENCOUNTER — Encounter: Payer: Self-pay | Admitting: Physical Medicine & Rehabilitation

## 2016-08-20 VITALS — BP 122/71 | HR 84 | Resp 14

## 2016-08-20 DIAGNOSIS — R000 Tachycardia, unspecified: Secondary | ICD-10-CM | POA: Diagnosis not present

## 2016-08-20 DIAGNOSIS — Z5181 Encounter for therapeutic drug level monitoring: Secondary | ICD-10-CM

## 2016-08-20 DIAGNOSIS — M1712 Unilateral primary osteoarthritis, left knee: Secondary | ICD-10-CM | POA: Diagnosis not present

## 2016-08-20 DIAGNOSIS — M47816 Spondylosis without myelopathy or radiculopathy, lumbar region: Secondary | ICD-10-CM | POA: Diagnosis not present

## 2016-08-20 DIAGNOSIS — Z7901 Long term (current) use of anticoagulants: Secondary | ICD-10-CM | POA: Diagnosis not present

## 2016-08-20 DIAGNOSIS — F419 Anxiety disorder, unspecified: Secondary | ICD-10-CM | POA: Diagnosis not present

## 2016-08-20 DIAGNOSIS — E039 Hypothyroidism, unspecified: Secondary | ICD-10-CM | POA: Diagnosis not present

## 2016-08-20 DIAGNOSIS — Z79899 Other long term (current) drug therapy: Secondary | ICD-10-CM | POA: Diagnosis not present

## 2016-08-20 DIAGNOSIS — J449 Chronic obstructive pulmonary disease, unspecified: Secondary | ICD-10-CM | POA: Insufficient documentation

## 2016-08-20 DIAGNOSIS — M545 Low back pain: Secondary | ICD-10-CM | POA: Insufficient documentation

## 2016-08-20 DIAGNOSIS — F17210 Nicotine dependence, cigarettes, uncomplicated: Secondary | ICD-10-CM | POA: Diagnosis not present

## 2016-08-20 DIAGNOSIS — M7062 Trochanteric bursitis, left hip: Secondary | ICD-10-CM | POA: Diagnosis not present

## 2016-08-20 DIAGNOSIS — G8929 Other chronic pain: Secondary | ICD-10-CM | POA: Insufficient documentation

## 2016-08-20 DIAGNOSIS — F329 Major depressive disorder, single episode, unspecified: Secondary | ICD-10-CM | POA: Insufficient documentation

## 2016-08-20 DIAGNOSIS — I5032 Chronic diastolic (congestive) heart failure: Secondary | ICD-10-CM | POA: Insufficient documentation

## 2016-08-20 MED ORDER — DICLOFENAC SODIUM 1 % TD GEL: 1.0000 "application " | Freq: Three times a day (TID) | TRANSDERMAL | 4 refills | Status: DC

## 2016-08-20 NOTE — Patient Instructions (Signed)
PLEASE FEEL FREE TO CALL OUR OFFICE WITH ANY PROBLEMS OR QUESTIONS [phone])      Trochanteric Bursitis Rehab Ask your health care provider which exercises are safe for you. Do exercises exactly as told by your health care provider and adjust them as directed. It is normal to feel mild stretching, pulling, tightness, or discomfort as you do these exercises, but you should stop right away if you feel sudden pain or your pain gets worse.Do not begin these exercises until told by your health care provider. Stretching exercises These exercises warm up your muscles and joints and improve the movement and flexibility of your hip. These exercises also help to relieve pain and stiffness. Exercise A: Iliotibial band stretch   1. Lie on your side with your left / right leg in the top position. 2. Bend your left / right knee and grab your ankle. 3. Slowly bring your knee back so your thigh is behind your body. 4. Slowly lower your knee toward the floor until you feel a gentle stretch on the outside of your left / right thigh. If you do not feel a stretch and your knee will not fall farther, place the heel of your other foot on top of your outer knee and pull your thigh down farther. 5. Hold this position for __________ seconds. 6. Slowly return to the starting position. Repeat __________ times. Complete this exercise __________ times a day. Strengthening exercises These exercises build strength and endurance in your hip and pelvis. Endurance is the ability to use your muscles for a long time, even after they get tired. Exercise B: Bridge (  hip extensors) 1. Lie on your back on a firm surface with your knees bent and your feet flat on the floor. 2. Tighten your buttocks muscles and lift your buttocks off the floor until your trunk is level with your thighs. You should feel the muscles working in your buttocks and the back of your thighs. If this exercise is too easy, try doing it with your arms  crossed over your chest. 3. Hold this position for __________ seconds. 4. Slowly return to the starting position. 5. Let your muscles relax completely between repetitions. Repeat __________ times. Complete this exercise __________ times a day. Exercise C: Squats ( knee extensors and  quadriceps) 1. Stand in front of a table, with your feet and knees pointing straight ahead. You may rest your hands on the table for balance but not for support. 2. Slowly bend your knees and lower your hips like you are going to sit in a chair.  Keep your weight over your heels, not over your toes.  Keep your lower legs upright so they are parallel with the table legs.  Do not let your hips go lower than your knees.  Do not bend lower than told by your health care provider.  If your hip pain increases, do not bend as low. 3. Hold this position for __________ seconds. 4. Slowly push with your legs to return to standing. Do not use your hands to pull yourself to standing. Repeat __________ times. Complete this exercise __________ times a day. Exercise D: Hip hike 1. Stand sideways on a bottom step. Stand on your left / right leg with your other foot unsupported next to the step. You can hold onto the railing or wall if needed for balance. 2. Keeping your knees straight and your torso square, lift your left / right hip up toward the ceiling. 3. Hold this position for  __________ seconds. 4. Slowly let your left / right hip lower toward the floor, past the starting position. Your foot should get closer to the floor. Do not lean or bend your knees. Repeat __________ times. Complete this exercise __________ times a day. Exercise E: Single leg stand 1. Stand near a counter or door frame that you can hold onto for balance as needed. It is helpful to stand in front of a mirror for this exercise so you can watch your hip. 2. Squeeze your left / right buttock muscles then lift up your other foot. Do not let your left  / right hip push out to the side. 3. Hold this position for __________ seconds. Repeat __________ times. Complete this exercise __________ times a day. This information is not intended to replace advice given to you by your health care provider. Make sure you discuss any questions you have with your health care provider. Document Released: 06/05/2004 Document Revised: 01/03/2016 Document Reviewed: 04/13/2015 Elsevier Interactive Patient Education  2017 [employer].

## 2016-08-20 NOTE — Progress Notes (Signed)
Subjective:    Patient ID: Ashley Houston, female    DOB: Sep 18, 1940, 76 y.o.   MRN: [id number]  HPI  This is an initial visit for Ashley Houston who presents with a history of chronic low back pain for the last 10+ years. She saw Dr. Channing Mutters 2 years ago who ordered an MRI which revealed DDD and spondylosis per patient report. There is no image or report for me to review. She was deemed a non-operative candidate because of cardiac history. She was managed conservatively with medications. She has not had any formal therapies.   She also reports tendonitis in her shoulders,arthritis in her left knee and bilateral hips. Enclosed in her referral packet was a recent xray of the left knee which indicates an effusion and mild degenerative changes.   She doesn't report a history of heavy labor or prior trauma although she did work in an [employer] for 6 years.   As far as symptom location/quality is concerned, the back pain is along her waist line with some radiation to her hips and sacrum. Prolonged standing and walking seem to aggravate the pain. She usually can go for about 30" before she has to stop. She struggles running the vacuum at home. Her back will tighten up if she sits for a long time. Sleep is ok, typically sleeping on her right side.   She hasn't had therapy. She doesn't stretch. She is active at home to her tolerance.    For pain, she's on gabapentin for "nerve" pain which has provided no relief. She is on meloxicam daily which HAS helped. Additionally, she takes percocet 10/325 q6 prn #120. She is on miralax as needed to help with constipation.   Pain Inventory Average Pain 10 Pain Right Now 4 My pain is sharp, dull and aching  In the last 24 hours, has pain interfered with the following? General activity 8 Relation with others 8 Enjoyment of life 8 What TIME of day is your pain at its worst? morning Sleep (in general) Good  Pain is worse with: bending, sitting,  standing and some activites Pain improves with: rest, pacing activities and medication Relief from Meds: 9  Mobility walk without assistance walk with assistance use a cane how many minutes can you walk? 30 ability to climb steps?  no do you drive?  yes  Function I need assistance with the following:  meal prep, household duties and shopping  Neuro/Psych trouble walking depression anxiety  Prior Studies new visit  Physicians involved in your care new visit   History reviewed. No pertinent family history. Social History   Social History  . Marital status: Widowed    Spouse name: N/A  . Number of children: N/A  . Years of education: N/A   Social History Main Topics  . Smoking status: Current Every Day Smoker    Packs/day: 0.50    Types: Cigarettes  . Smokeless tobacco: Never Used  . Alcohol use No  . Drug use: No  . Sexual activity: Not Asked   Other Topics Concern  . None   Social History Narrative  . None   Past Surgical History:  Procedure Laterality Date  . ABDOMINAL HYSTERECTOMY    . CHOLECYSTECTOMY    . TONSILLECTOMY     Past Medical History:  Diagnosis Date  . Anxiety   . Chronic diastolic heart failure (HCC)   . Chronic pain    hips back and shoulders  . COPD (chronic obstructive pulmonary disease) (HCC)   .  Depression   . Hypothyroidism 09/08/2015  . Tachycardia   . Thyroid disease    BP 122/71 (BP Location: Right Arm, Patient Position: Sitting, Cuff Size: Normal)   Pulse 84   Resp 14   SpO2 (!) 85%   Opioid Risk Score:   Fall Risk Score:  `1  Depression screen PHQ 2/9  Depression screen [facility] 2/9 08/20/2016 09/21/2015  Decreased Interest 1 0  Down, Depressed, Hopeless 0 0  PHQ - 2 Score 1 0  Altered sleeping 0 -  Tired, decreased energy 0 -  Change in appetite 0 -  Feeling bad or failure about yourself  0 -  Trouble concentrating 0 -  Moving slowly or fidgety/restless 0 -  Suicidal thoughts 0 -  PHQ-9 Score 1 -  Difficult  doing work/chores Somewhat difficult -    Review of Systems  Constitutional: Negative.   HENT: Negative.   Eyes: Negative.   Respiratory: Positive for shortness of breath.   Cardiovascular: Negative.   Gastrointestinal: Positive for diarrhea.  Endocrine: Negative.   Genitourinary: Negative.   Musculoskeletal: Positive for arthralgias, back pain, gait problem and myalgias.  Skin: Negative.   Allergic/Immunologic: Negative.   Hematological: Bruises/bleeds easily.  Psychiatric/Behavioral: Positive for dysphoric mood. The patient is nervous/anxious.   All other systems reviewed and are negative.      Objective:   Physical Exam  General: Alert and oriented x 3, No apparent distress HEENT: Head is normocephalic, atraumatic, PERRLA, EOMI, sclera anicteric, oral mucosa pink and moist, dentition intact, ext ear canals clear,  Neck: Supple without JVD or lymphadenopathy Heart: Reg rate and rhythm.  Chest: CTA bilaterally. Normal effort Abdomen: Soft, non-tender, non-distended, bowel sounds positive. Extremities: No clubbing, cyanosis, or edema. Pulses are 2+ Skin: Clean and intact without signs of breakdown Neuro: Pt is cognitively appropriate with normal insight, memory, and awareness. Cranial nerves 2-12 are intact. Sensory exam is normal. Reflexes are 2+ in all 4's. Fine motor coordination is intact. No tremors. Motor function is grossly 5/5.  Musculoskeletal: Pt with decreased lumbar ROM in all planes but especially in extension and lateral bending. Facet maneuvers provoked pain bilaterally. She has mild tenderness to palpation of the lumbar spinous processes and paraspinals. Left greater trochanter is TTP with + cross legged maneuver. Left knee with mild medial effusion and joint line pain. Meniscal maneuvers were positive in the right medial knee. Hamstrings are tight bilaterally as are her lumbar paraspinals. Shoulder ROM was functional. Psych: Pt's affect is appropriate. Pt is  cooperative        Assessment & Plan:  1. 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  Left greater trochanter bursitis 3. OA left knee, ?meniscal injury   Plan:  1. Initiate a trial of voltaren gel for left knee and left hip. Consider left knee injection 2. MRI of lumbar spine 2016 was requested for review 3. Lumbar facet stretches were provided and reviewed. needs to stretch hamstrings also 4. Greater troch stretches were provided and reviewed. Ample ice suggested.  5. UDS was collected today. I would be willing to assume narcotic rx if sample consistent. I expressed to the patient that her pain likely could be managed with less narcotic medication 6. Pt is a candidate for formal PT 7. Likely will wean off gabapentin as I don't see a role for it in her case.  Followup in 1 month.   Forty-five minutes of face to face patient care time were spent during  this visit. All questions were encouraged and answered.Greater than 50% of time during this encounter was spent counseling patient/family in regard to low back pain etiologies, possible remedies, etc.

## 2016-08-25 LAB — TOXASSURE SELECT,+ANTIDEPR,UR

## 2016-08-28 ENCOUNTER — Telehealth: Payer: Self-pay | Admitting: *Deleted

## 2016-08-28 DIAGNOSIS — M25562 Pain in left knee: Secondary | ICD-10-CM | POA: Diagnosis not present

## 2016-08-28 DIAGNOSIS — Z6821 Body mass index (BMI) 21.0-21.9, adult: Secondary | ICD-10-CM | POA: Diagnosis not present

## 2016-08-28 DIAGNOSIS — I10 Essential (primary) hypertension: Secondary | ICD-10-CM | POA: Diagnosis not present

## 2016-08-28 DIAGNOSIS — F411 Generalized anxiety disorder: Secondary | ICD-10-CM | POA: Diagnosis not present

## 2016-08-28 DIAGNOSIS — M545 Low back pain: Secondary | ICD-10-CM | POA: Diagnosis not present

## 2016-08-28 DIAGNOSIS — E782 Mixed hyperlipidemia: Secondary | ICD-10-CM | POA: Diagnosis not present

## 2016-08-28 NOTE — Telephone Encounter (Signed)
Urine drug screen for this encounter is consistent for prescribed medication 

## 2016-09-16 DIAGNOSIS — J449 Chronic obstructive pulmonary disease, unspecified: Secondary | ICD-10-CM | POA: Diagnosis not present

## 2016-09-16 DIAGNOSIS — I5040 Unspecified combined systolic (congestive) and diastolic (congestive) heart failure: Secondary | ICD-10-CM | POA: Diagnosis not present

## 2016-09-24 ENCOUNTER — Encounter
Payer: Commercial Managed Care - HMO | Attending: Physical Medicine & Rehabilitation | Admitting: Physical Medicine & Rehabilitation

## 2016-09-24 ENCOUNTER — Encounter: Payer: Self-pay | Admitting: Physical Medicine & Rehabilitation

## 2016-09-24 VITALS — BP 112/69 | HR 76

## 2016-09-24 DIAGNOSIS — R000 Tachycardia, unspecified: Secondary | ICD-10-CM | POA: Insufficient documentation

## 2016-09-24 DIAGNOSIS — I5032 Chronic diastolic (congestive) heart failure: Secondary | ICD-10-CM | POA: Diagnosis not present

## 2016-09-24 DIAGNOSIS — J449 Chronic obstructive pulmonary disease, unspecified: Secondary | ICD-10-CM | POA: Diagnosis not present

## 2016-09-24 DIAGNOSIS — F329 Major depressive disorder, single episode, unspecified: Secondary | ICD-10-CM | POA: Insufficient documentation

## 2016-09-24 DIAGNOSIS — M47816 Spondylosis without myelopathy or radiculopathy, lumbar region: Secondary | ICD-10-CM | POA: Diagnosis not present

## 2016-09-24 DIAGNOSIS — G8929 Other chronic pain: Secondary | ICD-10-CM | POA: Insufficient documentation

## 2016-09-24 DIAGNOSIS — F419 Anxiety disorder, unspecified: Secondary | ICD-10-CM | POA: Insufficient documentation

## 2016-09-24 DIAGNOSIS — M545 Low back pain: Secondary | ICD-10-CM | POA: Diagnosis not present

## 2016-09-24 DIAGNOSIS — M7062 Trochanteric bursitis, left hip: Secondary | ICD-10-CM | POA: Diagnosis not present

## 2016-09-24 DIAGNOSIS — E039 Hypothyroidism, unspecified: Secondary | ICD-10-CM | POA: Diagnosis not present

## 2016-09-24 DIAGNOSIS — F17210 Nicotine dependence, cigarettes, uncomplicated: Secondary | ICD-10-CM | POA: Insufficient documentation

## 2016-09-24 DIAGNOSIS — M1712 Unilateral primary osteoarthritis, left knee: Secondary | ICD-10-CM | POA: Diagnosis not present

## 2016-09-24 MED ORDER — OXYCODONE-ACETAMINOPHEN 10-325 MG PO TABS: 1.0000 | ORAL_TABLET | Freq: Three times a day (TID) | ORAL | 0 refills | Status: DC | PRN

## 2016-09-24 MED ORDER — MELOXICAM 15 MG PO TABS
15.0000 mg | ORAL_TABLET | Freq: Every day | ORAL | 5 refills | Status: DC
Start: 2016-09-24 — End: 2017-08-26

## 2016-09-24 NOTE — Progress Notes (Signed)
Subjective:    Patient ID: Ashley Houston, female    DOB: 04/13/1941, 76 y.o.   MRN: [id number]  HPI   [name] is here in follow up of her chronic pain. She found that the voltaren gel is helpful. She is doing her stretches about 3-4 x per week. Her daughter helps to motivate her. She can walk about 15 minutes about a time without stopping. Her back pain still does not radiate.   She has been on meloxicam with some benefit. She would like to continue. She also has been on gabapentin wihtout substantial benefit.  I did not receive her lumbar MRI.    Pain Inventory Average Pain 10 Pain Right Now 0 My pain is dull and aching  In the last 24 hours, has pain interfered with the following? General activity 7 Relation with others 5 Enjoyment of life 9 What TIME of day is your pain at its worst? daytime Sleep (in general) Fair  Pain is worse with: walking, bending, sitting and standing Pain improves with: medication Relief from Meds: 6  Mobility walk without assistance do you drive?  yes  Function I need assistance with the following:  meal prep, household duties and shopping  Neuro/Psych spasms depression anxiety  Prior Studies Any changes since last visit?  no  Physicians involved in your care Any changes since last visit?  no   No family history on file. Social History   Social History  . Marital status: Widowed    Spouse name: N/A  . Number of children: N/A  . Years of education: N/A   Social History Main Topics  . Smoking status: Current Every Day Smoker    Packs/day: 0.50    Types: Cigarettes  . Smokeless tobacco: Never Used  . Alcohol use No  . Drug use: No  . Sexual activity: Not on file   Other Topics Concern  . Not on file   Social History Narrative  . No narrative on file   Past Surgical History:  Procedure Laterality Date  . ABDOMINAL HYSTERECTOMY    . CHOLECYSTECTOMY    . TONSILLECTOMY     Past Medical History:  Diagnosis  Date  . Anxiety   . Chronic diastolic heart failure (HCC)   . Chronic pain    hips back and shoulders  . COPD (chronic obstructive pulmonary disease) (HCC)   . Depression   . Hypothyroidism 09/08/2015  . Tachycardia   . Thyroid disease    There were no vitals taken for this visit.  Opioid Risk Score:   Fall Risk Score:  `1  Depression screen PHQ 2/9  [facility]2016 09/21/2015  Decreased Interest 1 0  Down, Depressed, Hopeless 0 0  PHQ - 2 Score 1 0  Altered sleeping 0 -  Tired, decreased energy 0 -  Change in appetite 0 -  Feeling bad or failure about yourself  0 -  Trouble concentrating 0 -  Moving slowly or fidgety/restless 0 -  Suicidal thoughts 0 -  PHQ-9 Score 1 -  Difficult doing work/chores Somewhat difficult -    Review of Systems  Constitutional: Negative.   HENT: Negative.   Eyes: Negative.   Respiratory: Positive for shortness of breath.   Cardiovascular: Negative.   Gastrointestinal: Negative.   Endocrine: Negative.   Genitourinary: Negative.   Musculoskeletal: Negative.   Skin: Negative.   Allergic/Immunologic: Negative.   Neurological: Negative.   Hematological: Negative.   Psychiatric/Behavioral: Negative.   All other  systems reviewed and are negative.      Objective:   Physical Exam  General: Alert and oriented x 3, No apparent distress HEENT: Head is normocephalic, atraumatic, PERRLA, EOMI, sclera anicteric, oral mucosa pink and moist, dentition intact, ext ear canals clear,  Neck: Supple without JVD or lymphadenopathy Heart: RRR.  Chest: CTA B Abdomen: Soft, non-tender, non-distended, bowel sounds positive. Extremities: No clubbing, cyanosis, or edema. Pulses are 2+ Skin: Clean and intact without signs of breakdown Neuro: Pt is cognitively appropriate with normal insight, memory, and awareness. Cranial nerves 2-12 are intact. Sensory exam is normal. Reflexes are 2+ in all 4's. Fine motor coordination is intact. No  tremors. Motor function is grossly 5/5.  Musculoskeletal: Pt with decreased lumbar ROM in all planes but especially in extension and lateral bending. Facet maneuvers provoked pain bilaterally once again. Marland Kitchenleft greater troch less tender. Left knee with mild medial effusion and joint line pain. Meniscal maneuvers were positive in the right medial knee but she tolerated more movement today. .  lumbar paraspinals remain tight. Hamstrings are a little better. . Shoulder ROM was functional. Psych: Pt's affect is appropriate. Pt is cooperative        Assessment & Plan:  1. 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  Left greater trochanter bursitis 3. OA left knee, ?meniscal injury   Plan:  1. Initiate a trial of voltaren gel for left knee and left hip. Consider left knee injection 2. MRI of lumbar spine 2016 was requested AGAIN for review 3. Lumbar facet stretches were provided and reviewed. needs to stretch hamstrings also 4. Greater troch stretches were provided and reviewed. Ample ice suggested.  5. I have assumed her percocet 10/325, one q8 prn, #90. We will continue the opioid monitoring program, this consists of regular clinic visits, examinations, urine drug screen, pill counts as well as use of [place] Controlled Substance Reporting System. [place]CCS[place] today.  6. Pt is a candidate for formal PT once I can review her MRI. Can consider injections also 7.  wean off gabapentin as I don't see a role for it in her case. Instructions were given  Followup in 1 month.   15 minutes of face to face patient care time were spent during this visit. All questions were encouraged and answered. Greater than 50% of time during this encounter was spent counseling patient/family in regard to HEP, pain mgt, potential treatments. Marland Kitchen

## 2016-09-24 NOTE — Patient Instructions (Signed)
KEEP UP WITH YOUR EXERCISES ON A DAILY BASIS!!!

## 2016-10-02 DIAGNOSIS — K0500 Acute gingivitis, plaque induced: Secondary | ICD-10-CM | POA: Diagnosis not present

## 2016-10-02 DIAGNOSIS — S025XXB Fracture of tooth (traumatic), initial encounter for open fracture: Secondary | ICD-10-CM | POA: Diagnosis not present

## 2016-10-02 DIAGNOSIS — Z6820 Body mass index (BMI) 20.0-20.9, adult: Secondary | ICD-10-CM | POA: Diagnosis not present

## 2016-10-17 DIAGNOSIS — J449 Chronic obstructive pulmonary disease, unspecified: Secondary | ICD-10-CM | POA: Diagnosis not present

## 2016-10-17 DIAGNOSIS — I5040 Unspecified combined systolic (congestive) and diastolic (congestive) heart failure: Secondary | ICD-10-CM | POA: Diagnosis not present

## 2016-10-27 ENCOUNTER — Encounter: Payer: Medicare HMO | Attending: Physical Medicine & Rehabilitation | Admitting: Physical Medicine & Rehabilitation

## 2016-10-27 ENCOUNTER — Encounter: Payer: Self-pay | Admitting: Physical Medicine & Rehabilitation

## 2016-10-27 VITALS — BP 115/69 | HR 74 | Resp 14

## 2016-10-27 DIAGNOSIS — R000 Tachycardia, unspecified: Secondary | ICD-10-CM | POA: Diagnosis not present

## 2016-10-27 DIAGNOSIS — M1712 Unilateral primary osteoarthritis, left knee: Secondary | ICD-10-CM | POA: Insufficient documentation

## 2016-10-27 DIAGNOSIS — F329 Major depressive disorder, single episode, unspecified: Secondary | ICD-10-CM | POA: Diagnosis not present

## 2016-10-27 DIAGNOSIS — F419 Anxiety disorder, unspecified: Secondary | ICD-10-CM | POA: Diagnosis not present

## 2016-10-27 DIAGNOSIS — G8929 Other chronic pain: Secondary | ICD-10-CM | POA: Diagnosis not present

## 2016-10-27 DIAGNOSIS — M47816 Spondylosis without myelopathy or radiculopathy, lumbar region: Secondary | ICD-10-CM

## 2016-10-27 DIAGNOSIS — E039 Hypothyroidism, unspecified: Secondary | ICD-10-CM | POA: Insufficient documentation

## 2016-10-27 DIAGNOSIS — M5136 Other intervertebral disc degeneration, lumbar region: Secondary | ICD-10-CM

## 2016-10-27 DIAGNOSIS — M545 Low back pain: Secondary | ICD-10-CM | POA: Diagnosis not present

## 2016-10-27 DIAGNOSIS — F17210 Nicotine dependence, cigarettes, uncomplicated: Secondary | ICD-10-CM | POA: Insufficient documentation

## 2016-10-27 DIAGNOSIS — I5032 Chronic diastolic (congestive) heart failure: Secondary | ICD-10-CM | POA: Insufficient documentation

## 2016-10-27 DIAGNOSIS — M7062 Trochanteric bursitis, left hip: Secondary | ICD-10-CM

## 2016-10-27 DIAGNOSIS — J449 Chronic obstructive pulmonary disease, unspecified: Secondary | ICD-10-CM | POA: Diagnosis not present

## 2016-10-27 MED ORDER — CYCLOBENZAPRINE HCL 10 MG PO TABS: 10.0000 mg | ORAL_TABLET | Freq: Three times a day (TID) | ORAL | 2 refills | Status: DC | PRN

## 2016-10-27 MED ORDER — OXYCODONE-ACETAMINOPHEN 10-325 MG PO TABS: 1.0000 | ORAL_TABLET | Freq: Three times a day (TID) | ORAL | 0 refills | Status: DC | PRN

## 2016-10-27 NOTE — Progress Notes (Signed)
Subjective:    Patient ID: Ashley Houston, female    DOB: 06/18/1940, 76 y.o.   MRN: [id number]  HPI   Mrs. Rockhill is here in follow up of her chronic low back pain. She feels that the pain is worse over the last month. She has tried to perform her stretches but feels that they made things worse. She weaned off the gabapentin but resumed it because she felt that her pain increased after stopping. She continues on percocet for pain control. She is using mobic and voltaren gel as well.   Percocet does seem to provide some pain relief. She is moving her bowels and emptying bladder.   I finally received her MRI report. The study was performed on 06/23/2014. The most notable findings are at L4-5 and L5-S1. At L4-5 there is a broad based disc herniation indenting the thecal sac which had progressed significantly from MRI in 2013. There is lateral recess stenosis also which could cause neural compression. L5-S1 is notable for endplate osteophytes and broad based disc bulge. There is an upward migrated fragment to the left of midline with mild facet hypertrophy. There is subarticular lateral recess stenosis as well.   Pain Inventory Average Pain 10 Pain Right Now 5 My pain is dull and aching  In the last 24 hours, has pain interfered with the following? General activity 7 Relation with others 10 Enjoyment of life 8 What TIME of day is your pain at its worst? morning, evening, night Sleep (in general) Good  Pain is worse with: walking, bending, sitting and standing Pain improves with: rest, therapy/exercise and medication Relief from Meds: 10  Mobility walk without assistance ability to climb steps?  no do you drive?  yes  Function I need assistance with the following:  meal prep, household duties and shopping  Neuro/Psych trouble walking spasms depression anxiety  Prior Studies Any changes since last visit?  no  Physicians involved in your care Any changes since last visit?   no   No family history on file. Social History   Social History  . Marital status: Widowed    Spouse name: N/A  . Number of children: N/A  . Years of education: N/A   Social History Main Topics  . Smoking status: Current Every Day Smoker    Packs/day: 0.50    Types: Cigarettes  . Smokeless tobacco: Never Used  . Alcohol use No  . Drug use: No  . Sexual activity: Not on file   Other Topics Concern  . Not on file   Social History Narrative  . No narrative on file   Past Surgical History:  Procedure Laterality Date  . ABDOMINAL HYSTERECTOMY    . CHOLECYSTECTOMY    . TONSILLECTOMY     Past Medical History:  Diagnosis Date  . Anxiety   . Chronic diastolic heart failure (HCC)   . Chronic pain    hips back and shoulders  . COPD (chronic obstructive pulmonary disease) (HCC)   . Depression   . Hypothyroidism 09/08/2015  . Tachycardia   . Thyroid disease    BP 115/69 (BP Location: Left Arm, Patient Position: Sitting, Cuff Size: Normal)   Pulse 74   Resp 14   SpO2 (!) 87%   Opioid Risk Score:   Fall Risk Score:  `1  Depression screen PHQ 2/9  [facility] 08/20/2016 09/21/2015  Decreased Interest 1 0  Down, Depressed, Hopeless 0 0  PHQ - 2 Score 1 0  Altered  sleeping 0 -  Tired, decreased energy 0 -  Change in appetite 0 -  Feeling bad or failure about yourself  0 -  Trouble concentrating 0 -  Moving slowly or fidgety/restless 0 -  Suicidal thoughts 0 -  PHQ-9 Score 1 -  Difficult doing work/chores Somewhat difficult -    Review of Systems  Constitutional: Negative.   HENT: Negative.   Eyes: Negative.   Respiratory: Negative.   Cardiovascular: Negative.   Gastrointestinal: Negative.   Endocrine: Negative.   Genitourinary: Negative.   Musculoskeletal: Positive for arthralgias, back pain, gait problem and myalgias.       Spasms  Skin: Negative.   Allergic/Immunologic: Negative.   Hematological: Negative.   Psychiatric/Behavioral:  Positive for dysphoric mood. The patient is nervous/anxious.   All other systems reviewed and are negative.      Objective:   Physical Exam  General: Alert and oriented x 3, No apparent distress HEENT:Head is normocephalic, atraumatic, PERRLA, EOMI, sclera anicteric, oral mucosa pink and moist, dentition intact, ext ear canals clear,  Neck:Supple without JVD or lymphadenopathy Heart: RRR.  Chest:CTA B Abdomen:Soft, non-tender, non-distended, bowel sounds positive. Extremities:No clubbing, cyanosis, or edema. Pulses are 2+ Skin:Clean and intact without signs of breakdown Neuro:Pt is cognitively appropriate with normal insight, memory, and awareness. Cranial nerves 2-12 are intact. Sensory exam is normal. Reflexes are 2+ in all 4's. Fine motor coordination is intact. No tremors. Motor function is grossly 5/5.  Musculoskeletal:Pt with decreased lumbar ROM in all planes but especially in extension and lateral bending. Facet maneuvers provoked pain bilaterally once again. Lumbar paraspinals are taut. Marland Kitchenleft greater troch somewhat tender. Left knee with mild medial effusion and joint line pain.Marland Kitchen Psych:Pt's affect is appropriate. Pt is cooperative       Assessment & Plan:  1. 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 Left greater trochanter bursitis 3. OA left knee, ?meniscal injury   Plan:  1. Continue voltaren gel for left knee and left hip. Consider left knee injection 2. Trial of flexeril for back spasms, 10mg  q8 prn. #60. She was also instructed to use a heating pad in the morning and before stretching.  3. I would like her to continue with stretches as tolerated.  4. Ice to hips 5. Continue percocet 10/325, one q8 prn, #90. We will continue the opioid monitoring program, this consists of regular clinic visits, examinations, urine drug screen, pill counts as well as use of [place] Controlled Substance Reporting System.  [place] was reviewed today.  6. Will send patient for therapy at [facility] in [place]. for her degenerative disc disease and spondylosis. Focus should be on pilates based principles to improve lumbar rom, strength, length, etc.  7.  May stay with gabapentin for now  Followup in 1 month. Twenty five minutes of face to face patient care time were spent during this visit. All questions were encouraged and answered. Greater than 50% of time during this encounter was spent counseling patient/family in regard to MRI review, treatment recommendations, etc. .

## 2016-10-27 NOTE — Patient Instructions (Signed)
USE HEATING PAD WHEN YOU ARE JUST WAKING UP IN THE MORNING AND BEFORE STRETCHING OR EXERCISE

## 2016-11-06 DIAGNOSIS — M5441 Lumbago with sciatica, right side: Secondary | ICD-10-CM | POA: Diagnosis not present

## 2016-11-06 DIAGNOSIS — M5489 Other dorsalgia: Secondary | ICD-10-CM | POA: Diagnosis not present

## 2016-11-16 DIAGNOSIS — I5040 Unspecified combined systolic (congestive) and diastolic (congestive) heart failure: Secondary | ICD-10-CM | POA: Diagnosis not present

## 2016-11-16 DIAGNOSIS — J449 Chronic obstructive pulmonary disease, unspecified: Secondary | ICD-10-CM | POA: Diagnosis not present

## 2016-11-17 DIAGNOSIS — M5441 Lumbago with sciatica, right side: Secondary | ICD-10-CM | POA: Diagnosis not present

## 2016-11-17 DIAGNOSIS — M5489 Other dorsalgia: Secondary | ICD-10-CM | POA: Diagnosis not present

## 2016-11-19 DIAGNOSIS — M5441 Lumbago with sciatica, right side: Secondary | ICD-10-CM | POA: Diagnosis not present

## 2016-11-19 DIAGNOSIS — M5489 Other dorsalgia: Secondary | ICD-10-CM | POA: Diagnosis not present

## 2016-11-25 DIAGNOSIS — M5441 Lumbago with sciatica, right side: Secondary | ICD-10-CM | POA: Diagnosis not present

## 2016-11-25 DIAGNOSIS — M5489 Other dorsalgia: Secondary | ICD-10-CM | POA: Diagnosis not present

## 2016-11-27 DIAGNOSIS — M5441 Lumbago with sciatica, right side: Secondary | ICD-10-CM | POA: Diagnosis not present

## 2016-11-27 DIAGNOSIS — M5489 Other dorsalgia: Secondary | ICD-10-CM | POA: Diagnosis not present

## 2016-12-02 ENCOUNTER — Encounter: Payer: Medicare HMO | Attending: Physical Medicine & Rehabilitation | Admitting: Physical Medicine & Rehabilitation

## 2016-12-02 ENCOUNTER — Encounter: Payer: Self-pay | Admitting: Physical Medicine & Rehabilitation

## 2016-12-02 VITALS — BP 111/67 | HR 77

## 2016-12-02 DIAGNOSIS — G8929 Other chronic pain: Secondary | ICD-10-CM | POA: Insufficient documentation

## 2016-12-02 DIAGNOSIS — M47816 Spondylosis without myelopathy or radiculopathy, lumbar region: Secondary | ICD-10-CM

## 2016-12-02 DIAGNOSIS — G894 Chronic pain syndrome: Secondary | ICD-10-CM | POA: Diagnosis not present

## 2016-12-02 DIAGNOSIS — Z5181 Encounter for therapeutic drug level monitoring: Secondary | ICD-10-CM | POA: Diagnosis not present

## 2016-12-02 DIAGNOSIS — F329 Major depressive disorder, single episode, unspecified: Secondary | ICD-10-CM | POA: Insufficient documentation

## 2016-12-02 DIAGNOSIS — F419 Anxiety disorder, unspecified: Secondary | ICD-10-CM | POA: Insufficient documentation

## 2016-12-02 DIAGNOSIS — E039 Hypothyroidism, unspecified: Secondary | ICD-10-CM | POA: Insufficient documentation

## 2016-12-02 DIAGNOSIS — R000 Tachycardia, unspecified: Secondary | ICD-10-CM | POA: Insufficient documentation

## 2016-12-02 DIAGNOSIS — J449 Chronic obstructive pulmonary disease, unspecified: Secondary | ICD-10-CM | POA: Diagnosis not present

## 2016-12-02 DIAGNOSIS — F17210 Nicotine dependence, cigarettes, uncomplicated: Secondary | ICD-10-CM | POA: Diagnosis not present

## 2016-12-02 DIAGNOSIS — M7062 Trochanteric bursitis, left hip: Secondary | ICD-10-CM

## 2016-12-02 DIAGNOSIS — M545 Low back pain: Secondary | ICD-10-CM | POA: Insufficient documentation

## 2016-12-02 DIAGNOSIS — I5032 Chronic diastolic (congestive) heart failure: Secondary | ICD-10-CM | POA: Insufficient documentation

## 2016-12-02 DIAGNOSIS — Z79899 Other long term (current) drug therapy: Secondary | ICD-10-CM | POA: Diagnosis not present

## 2016-12-02 DIAGNOSIS — M1712 Unilateral primary osteoarthritis, left knee: Secondary | ICD-10-CM | POA: Diagnosis not present

## 2016-12-02 MED ORDER — OXYCODONE-ACETAMINOPHEN 10-325 MG PO TABS: 1.0000 | ORAL_TABLET | Freq: Three times a day (TID) | ORAL | 0 refills | Status: DC | PRN

## 2016-12-02 NOTE — Progress Notes (Signed)
Subjective:    Patient ID: Ashley Houston, female    DOB: 09/26/1940, 76 y.o.   MRN: [id number]  HPI   Ashley Houston is here in follow up of her chronic low back and knee pain. She has had great results with therapy. Results have been so good that she looks forward to going to therapy each week at [facility]. Ashley Houston demonstrated to me a few of the exercises she's doing currently at home. They have discussed ?TENS. Other modalities have not been used. She likes the voltaren gel for her left knee and hip. Percocet is helping her severe pain. Mood has oimproved as a result as well.    MRI 06/23/2014. The most notable findings are at L4-5 and L5-S1. At L4-5 there is a broad based disc herniation indenting the thecal sac which had progressed significantly from MRI in 2013. There is lateral recess stenosis also which could cause neural compression. L5-S1 is notable for endplate osteophytes and broad based disc bulge. There is an upward migrated fragment to the left of midline with mild facet hypertrophy. There is subarticular lateral recess stenosis as well.     Pain Inventory Average Pain 10 Pain Right Now 8 My pain is dull and aching  In the last 24 hours, has pain interfered with the following? General activity 5 Relation with others 6 Enjoyment of life 6 What TIME of day is your pain at its worst? morning evening night Sleep (in general) Good  Pain is worse with: walking, bending, sitting, standing and some activites Pain improves with: rest, therapy/exercise and medication Relief from Meds: 10  Mobility walk without assistance ability to climb steps?  yes do you drive?  yes  Function I need assistance with the following:  meal prep, household duties and shopping  Neuro/Psych spasms depression anxiety  Prior Studies Any changes since last visit?  no  Physicians involved in your care Any changes since last visit?  no   No family history on file. Social History     Social History  . Marital status: Widowed    Spouse name: N/A  . Number of children: N/A  . Years of education: N/A   Social History Main Topics  . Smoking status: Current Every Day Smoker    Packs/day: 0.50    Types: Cigarettes  . Smokeless tobacco: Never Used  . Alcohol use No  . Drug use: No  . Sexual activity: Not Asked   Other Topics Concern  . None   Social History Narrative  . None   Past Surgical History:  Procedure Laterality Date  . ABDOMINAL HYSTERECTOMY    . CHOLECYSTECTOMY    . TONSILLECTOMY     Past Medical History:  Diagnosis Date  . Anxiety   . Chronic diastolic heart failure (HCC)   . Chronic pain    hips back and shoulders  . COPD (chronic obstructive pulmonary disease) (HCC)   . Depression   . Hypothyroidism 09/08/2015  . Tachycardia   . Thyroid disease    BP 111/67   Pulse 77   SpO2 (!) 85% Comment: copd uses o2 at night  Opioid Risk Score:   Fall Risk Score:  `1  Depression screen PHQ 2/9  [facility] 12/02/2016 08/20/2016 09/21/2015  Decreased Interest 0 1 0  Down, Depressed, Hopeless 0 0 0  PHQ - 2 Score 0 1 0  Altered sleeping - 0 -  Tired, decreased energy - 0 -  Change in appetite -  0 -  Feeling bad or failure about yourself  - 0 -  Trouble concentrating - 0 -  Moving slowly or fidgety/restless - 0 -  Suicidal thoughts - 0 -  PHQ-9 Score - 1 -  Difficult doing work/chores - Somewhat difficult -    Review of Systems  Constitutional: Negative.   HENT: Negative.   Eyes: Negative.   Respiratory: Positive for shortness of breath.   Cardiovascular: Negative.   Gastrointestinal: Negative.   Endocrine: Negative.   Genitourinary: Negative.   Musculoskeletal:       Spasms  Skin: Negative.   Allergic/Immunologic: Negative.   Neurological: Negative.   Hematological: Negative.   Psychiatric/Behavioral: Positive for dysphoric mood. The patient is nervous/anxious.   All other systems reviewed and are  negative.      Objective:   Physical Exam   General: Alert and oriented x 3, No apparent distress HEENT:Head is normocephalic, atraumatic, PERRLA, EOMI, sclera anicteric, oral mucosa pink and moist, dentition intact, ext ear canals clear,  Neck:Supple without JVD or lymphadenopathy Heart: RRR.  Chest:CTA B Abdomen:Soft, non-tender, non-distended, bowel sounds positive. Extremities:No clubbing, cyanosis, or edema. Pulses are 2+ Skin:Clean and intact without signs of breakdown Neuro:Pt is cognitively appropriate with normal insight, memory, and awareness. Cranial nerves 2-12 are intact. Sensory exam is normal. Reflexes are 2+ in all 4's. Fine motor coordination is intact. No tremors. Motor function is grossly 5/5.  Musculoskeletal: IMPROVED lumbar flexion---nearly 75 degrees although still taut along right thoraco-lumbar paraspinals. Extension and facet maneuvers cause some pain but less so. Improve transfer ability. Walks with minimal antalgia .Left knee with mild medial effusion and joint line pain.Marland Kitchen Psych:Pt's affect is appropriate. Pt is cooperative       Assessment & Plan:  1. 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  2. Left greater trochanter bursitis 3. OA left knee, ?meniscal injury   Plan:  1. Continue voltaren gel for left knee and left hip--gel has helped quite a bit. Still can consider left knee injection as needed.   2. Continue flexeril for back spasms, 10mg  q8 prn. #60.   - heating pad in the morning and before stretching.  3. I would like her to continue with stretches as tolerated.  4. Ice to hips shall continue   5. Continue percocet 10/325, one q8 prn, #90. We will continue the opioid monitoring program, this consists of regular clinic visits, examinations, urine drug screen, pill counts as well as use of [place]  Controlled Substance Reporting System. NCCSRS was reviewed today.   6. Continue therapy at [facility] at [place]. for her degenerative disc disease and spondylosis. Continue with pilates based principles to improve lumbar rom, strength, length, etc. She is  7. Considering appearance of most recent MRI, an ESI might be appropriate. However, she is doing so well, that there is no need to pursue this currently.  Followup in 1 month with my NP.  Fifteen minutes of face to face patient care time were spent during this visit. All questions were encouraged and answered.

## 2016-12-02 NOTE — Patient Instructions (Signed)
PLEASE FEEL FREE TO CALL OUR OFFICE WITH ANY PROBLEMS OR QUESTIONS [phone])    CONTINUE WITH YOUR HOME EXERCISES DAILY

## 2016-12-03 DIAGNOSIS — M5441 Lumbago with sciatica, right side: Secondary | ICD-10-CM | POA: Diagnosis not present

## 2016-12-03 DIAGNOSIS — M5489 Other dorsalgia: Secondary | ICD-10-CM | POA: Diagnosis not present

## 2016-12-09 LAB — DRUG TOX MONITOR 1 W/CONF, ORAL FLD
ALPRAZOLAM: NEGATIVE ng/mL (ref <0.50)
Amphetamines: NEGATIVE ng/mL (ref <10)
BARBITURATES: NEGATIVE ng/mL (ref <10)
Benzodiazepines: POSITIVE ng/mL — AB (ref <0.50)
Buprenorphine: NEGATIVE ng/mL (ref <0.025)
COCAINE: NEGATIVE ng/mL (ref <2.5)
CODEINE: NEGATIVE ng/mL (ref <2.5)
COTININE: 14 ng/mL — AB (ref <5.0)
Chlordiazepoxide: NEGATIVE ng/mL (ref <0.50)
Clonazepam: NEGATIVE ng/mL (ref <0.50)
DIHYDROCODEINE: NEGATIVE ng/mL (ref <2.5)
Diazepam: NEGATIVE ng/mL (ref <0.50)
FLUNITRAZEPAM: NEGATIVE ng/mL (ref <0.50)
FLURAZEPAM: NEGATIVE ng/mL (ref <0.50)
Fentanyl: NEGATIVE ng/mL (ref <0.10)
HEROIN METABOLITE: NEGATIVE ng/mL (ref <1.0)
Hydrocodone: NEGATIVE ng/mL (ref <2.5)
Hydromorphone: NEGATIVE ng/mL (ref <2.5)
Lorazepam: 1.73 ng/mL — ABNORMAL HIGH (ref <0.50)
MDMA: NEGATIVE ng/mL (ref <10)
METHADONE: NEGATIVE ng/mL (ref <5.0)
MIDAZOLAM: NEGATIVE ng/mL (ref <0.50)
MORPHINE: NEGATIVE ng/mL (ref <2.5)
Marijuana: NEGATIVE ng/mL (ref <2.5)
Meperidine: NEGATIVE ng/mL (ref <5.0)
Meprobamate: NEGATIVE ng/mL (ref <2.5)
NICOTINE METABOLITE: POSITIVE ng/mL — AB (ref <5.0)
NORDIAZEPAM: NEGATIVE ng/mL (ref <0.50)
Norhydrocodone: NEGATIVE ng/mL (ref <2.5)
Noroxycodone: 12.5 ng/mL — ABNORMAL HIGH (ref <2.5)
OXYCODONE: 33.6 ng/mL — AB (ref <2.5)
OXYMORPHONE: NEGATIVE ng/mL (ref <2.5)
Opiates: POSITIVE ng/mL — AB (ref <2.5)
Oxazepam: NEGATIVE ng/mL (ref <0.50)
PHENCYCLIDINE: NEGATIVE ng/mL (ref <10)
PROPOXYPHENE: NEGATIVE ng/mL (ref <5.0)
TRIAZOLAM: NEGATIVE ng/mL (ref <0.50)
Tapentadol: NEGATIVE ng/mL (ref <5.0)
Temazepam: NEGATIVE ng/mL (ref <0.50)
Tramadol: NEGATIVE ng/mL (ref <5.0)
ZOLPIDEM: NEGATIVE ng/mL (ref <5.0)

## 2016-12-09 LAB — DRUG TOX ALC METAB W/CON, ORAL FLD: ALCOHOL METABOLITE: NEGATIVE ng/mL (ref <25)

## 2016-12-10 DIAGNOSIS — M5489 Other dorsalgia: Secondary | ICD-10-CM | POA: Diagnosis not present

## 2016-12-10 DIAGNOSIS — M5441 Lumbago with sciatica, right side: Secondary | ICD-10-CM | POA: Diagnosis not present

## 2016-12-11 ENCOUNTER — Telehealth: Payer: Self-pay | Admitting: *Deleted

## 2016-12-11 NOTE — Telephone Encounter (Signed)
Oral swab drug screen was consistent for prescribed medications.  ?

## 2016-12-12 DIAGNOSIS — M5489 Other dorsalgia: Secondary | ICD-10-CM | POA: Diagnosis not present

## 2016-12-12 DIAGNOSIS — M5441 Lumbago with sciatica, right side: Secondary | ICD-10-CM | POA: Diagnosis not present

## 2016-12-17 DIAGNOSIS — I5040 Unspecified combined systolic (congestive) and diastolic (congestive) heart failure: Secondary | ICD-10-CM | POA: Diagnosis not present

## 2016-12-17 DIAGNOSIS — J449 Chronic obstructive pulmonary disease, unspecified: Secondary | ICD-10-CM | POA: Diagnosis not present

## 2016-12-19 DIAGNOSIS — M5489 Other dorsalgia: Secondary | ICD-10-CM | POA: Diagnosis not present

## 2016-12-19 DIAGNOSIS — M5441 Lumbago with sciatica, right side: Secondary | ICD-10-CM | POA: Diagnosis not present

## 2016-12-26 DIAGNOSIS — M5441 Lumbago with sciatica, right side: Secondary | ICD-10-CM | POA: Diagnosis not present

## 2016-12-26 DIAGNOSIS — M5489 Other dorsalgia: Secondary | ICD-10-CM | POA: Diagnosis not present

## 2016-12-31 ENCOUNTER — Other Ambulatory Visit: Payer: Self-pay | Admitting: Physical Medicine & Rehabilitation

## 2016-12-31 DIAGNOSIS — M47816 Spondylosis without myelopathy or radiculopathy, lumbar region: Secondary | ICD-10-CM

## 2017-01-01 DIAGNOSIS — R300 Dysuria: Secondary | ICD-10-CM | POA: Diagnosis not present

## 2017-01-01 DIAGNOSIS — Z6820 Body mass index (BMI) 20.0-20.9, adult: Secondary | ICD-10-CM | POA: Diagnosis not present

## 2017-01-01 DIAGNOSIS — R109 Unspecified abdominal pain: Secondary | ICD-10-CM | POA: Diagnosis not present

## 2017-01-02 ENCOUNTER — Encounter: Payer: Self-pay | Admitting: Registered Nurse

## 2017-01-02 ENCOUNTER — Encounter: Payer: Medicare HMO | Attending: Physical Medicine & Rehabilitation | Admitting: Registered Nurse

## 2017-01-02 ENCOUNTER — Telehealth: Payer: Self-pay | Admitting: Registered Nurse

## 2017-01-02 VITALS — BP 99/63 | HR 79

## 2017-01-02 DIAGNOSIS — R000 Tachycardia, unspecified: Secondary | ICD-10-CM | POA: Diagnosis not present

## 2017-01-02 DIAGNOSIS — F329 Major depressive disorder, single episode, unspecified: Secondary | ICD-10-CM | POA: Diagnosis not present

## 2017-01-02 DIAGNOSIS — M47816 Spondylosis without myelopathy or radiculopathy, lumbar region: Secondary | ICD-10-CM | POA: Diagnosis not present

## 2017-01-02 DIAGNOSIS — F419 Anxiety disorder, unspecified: Secondary | ICD-10-CM | POA: Diagnosis not present

## 2017-01-02 DIAGNOSIS — E039 Hypothyroidism, unspecified: Secondary | ICD-10-CM | POA: Diagnosis not present

## 2017-01-02 DIAGNOSIS — I5032 Chronic diastolic (congestive) heart failure: Secondary | ICD-10-CM | POA: Insufficient documentation

## 2017-01-02 DIAGNOSIS — Z5181 Encounter for therapeutic drug level monitoring: Secondary | ICD-10-CM | POA: Diagnosis not present

## 2017-01-02 DIAGNOSIS — M5416 Radiculopathy, lumbar region: Secondary | ICD-10-CM

## 2017-01-02 DIAGNOSIS — M545 Low back pain: Secondary | ICD-10-CM | POA: Insufficient documentation

## 2017-01-02 DIAGNOSIS — F17210 Nicotine dependence, cigarettes, uncomplicated: Secondary | ICD-10-CM | POA: Insufficient documentation

## 2017-01-02 DIAGNOSIS — J449 Chronic obstructive pulmonary disease, unspecified: Secondary | ICD-10-CM | POA: Diagnosis not present

## 2017-01-02 DIAGNOSIS — G894 Chronic pain syndrome: Secondary | ICD-10-CM | POA: Diagnosis not present

## 2017-01-02 DIAGNOSIS — G8929 Other chronic pain: Secondary | ICD-10-CM | POA: Insufficient documentation

## 2017-01-02 DIAGNOSIS — M7062 Trochanteric bursitis, left hip: Secondary | ICD-10-CM

## 2017-01-02 DIAGNOSIS — Z79899 Other long term (current) drug therapy: Secondary | ICD-10-CM

## 2017-01-02 DIAGNOSIS — M1712 Unilateral primary osteoarthritis, left knee: Secondary | ICD-10-CM | POA: Insufficient documentation

## 2017-01-02 DIAGNOSIS — M5136 Other intervertebral disc degeneration, lumbar region: Secondary | ICD-10-CM | POA: Diagnosis not present

## 2017-01-02 MED ORDER — OXYCODONE-ACETAMINOPHEN 10-325 MG PO TABS: 1.0000 | ORAL_TABLET | Freq: Three times a day (TID) | ORAL | 0 refills | Status: DC | PRN

## 2017-01-02 NOTE — Telephone Encounter (Signed)
On (01/02/2017 the NCCSR was reviewed no conflict was seen on the [facility] Controlled Substance Reporting System with multiple prescribers. Ashley Houston has a signed narcotic contract with our office. If there were any discrepancies this would have been reported to her physician.

## 2017-01-02 NOTE — Progress Notes (Signed)
Subjective:    Patient ID: Ashley Houston, female    DOB: 10-Aug-1940, 76 y.o.   MRN: [id number]  HPI: [name] is a 76 year old female who returns for foolow up appointment for chronic pain and medication refill. She states her pain is located in her lower back radiating into her left buttock, left hip also has left knee pain. She rates her pain 7. Her current exercise regime is attending physical therapy twice a week and walking.   Daughter in Room.  Oral Swab Performed on 12/02/2016 it was consistent.   Pain Inventory Average Pain 10 Pain Right Now 7 My pain is dull and aching  In the last 24 hours, has pain interfered with the following? General activity 0 Relation with others 2 Enjoyment of life 0 What TIME of day is your pain at its worst? all Sleep (in general) Good  Pain is worse with: walking, bending, sitting and standing Pain improves with: rest, therapy/exercise and medication Relief from Meds: 8  Mobility walk without assistance  Function retired  Neuro/Psych spasms depression anxiety  Prior Studies Any changes since last visit?  no  Physicians involved in your care Any changes since last visit?  no   No family history on file. Social History   Social History  . Marital status: Widowed    Spouse name: N/A  . Number of children: N/A  . Years of education: N/A   Social History Main Topics  . Smoking status: Current Every Day Smoker    Packs/day: 0.50    Types: Cigarettes  . Smokeless tobacco: Never Used  . Alcohol use No  . Drug use: No  . Sexual activity: Not on file   Other Topics Concern  . Not on file   Social History Narrative  . No narrative on file   Past Surgical History:  Procedure Laterality Date  . ABDOMINAL HYSTERECTOMY    . CHOLECYSTECTOMY    . TONSILLECTOMY     Past Medical History:  Diagnosis Date  . Anxiety   . Chronic diastolic heart failure (HCC)   . Chronic pain    hips back and shoulders  .  COPD (chronic obstructive pulmonary disease) (HCC)   . Depression   . Hypothyroidism 09/08/2015  . Tachycardia   . Thyroid disease    There were no vitals taken for this visit.  Opioid Risk Score:   Fall Risk Score:  `1  Depression screen PHQ 2/9  Depression screen [facility] 2/9 12/02/2016 08/20/2016 09/21/2015  Decreased Interest 0 1 0  Down, Depressed, Hopeless 0 0 0  PHQ - 2 Score 0 1 0  Altered sleeping - 0 -  Tired, decreased energy - 0 -  Change in appetite - 0 -  Feeling bad or failure about yourself  - 0 -  Trouble concentrating - 0 -  Moving slowly or fidgety/restless - 0 -  Suicidal thoughts - 0 -  PHQ-9 Score - 1 -  Difficult doing work/chores - Somewhat difficult -     Review of Systems  Constitutional: Negative.   HENT: Negative.   Eyes: Negative.   Respiratory: Positive for shortness of breath.   Cardiovascular: Negative.   Gastrointestinal: Negative.   Endocrine: Negative.   Genitourinary: Negative.   Musculoskeletal: Negative.   Skin: Negative.   Allergic/Immunologic: Negative.   Neurological: Negative.   Hematological: Negative.   Psychiatric/Behavioral: Negative.   All other systems reviewed and are negative.      Objective:  Physical Exam  Constitutional: She is oriented to person, place, and time. She appears well-developed and well-nourished.  HENT:  Head: Normocephalic and atraumatic.  Neck: Normal range of motion. Neck supple.  Cardiovascular: Normal rate and regular rhythm.   Pulmonary/Chest: Effort normal and breath sounds normal.  Musculoskeletal:  Normal Muscle Bulk and Muscle Testing Reveals:  Upper Extremities: Full ROM and Muscle Strength 5/5 Lumbar Paraspinal Tenderness: L-4-L-5 Mainly Left Side Left Greater Trochanter Tenderness Lower Extremities: Full ROM and Muscle Strength 5/5 Arises from Table with ease Narrow based Gait  Neurological: She is alert and oriented to person, place, and time.  Skin: Skin is warm and dry.    Psychiatric: She has a normal mood and affect.  Nursing note and vitals reviewed.         Assessment & Plan:  1. 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 OA of Left Knee: Refilled Oxycodone 10/325 mg one tablet every 8 hours as needed #90.  We will continue the opioid monitoring program, this consists of regular clinic visits, examinations, urine drug screen, pill counts as well as use of [place] Controlled Substance reporting System.  Continue Voltaren Gel and HEP as tolerated 3. Muscle Spasms: Continue Flexeril  20 minutes of face to face patient care time was spent during this visit. All questions were encouraged and answered.  F/U in 1 month

## 2017-01-07 DIAGNOSIS — I10 Essential (primary) hypertension: Secondary | ICD-10-CM | POA: Diagnosis not present

## 2017-01-07 DIAGNOSIS — R300 Dysuria: Secondary | ICD-10-CM | POA: Diagnosis not present

## 2017-01-07 DIAGNOSIS — M545 Low back pain: Secondary | ICD-10-CM | POA: Diagnosis not present

## 2017-01-07 DIAGNOSIS — R109 Unspecified abdominal pain: Secondary | ICD-10-CM | POA: Diagnosis not present

## 2017-01-07 DIAGNOSIS — F411 Generalized anxiety disorder: Secondary | ICD-10-CM | POA: Diagnosis not present

## 2017-01-07 DIAGNOSIS — E782 Mixed hyperlipidemia: Secondary | ICD-10-CM | POA: Diagnosis not present

## 2017-01-07 DIAGNOSIS — Z0001 Encounter for general adult medical examination with abnormal findings: Secondary | ICD-10-CM | POA: Diagnosis not present

## 2017-01-07 DIAGNOSIS — Z6821 Body mass index (BMI) 21.0-21.9, adult: Secondary | ICD-10-CM | POA: Diagnosis not present

## 2017-01-07 DIAGNOSIS — M25562 Pain in left knee: Secondary | ICD-10-CM | POA: Diagnosis not present

## 2017-01-14 DIAGNOSIS — M5489 Other dorsalgia: Secondary | ICD-10-CM | POA: Diagnosis not present

## 2017-01-14 DIAGNOSIS — M5441 Lumbago with sciatica, right side: Secondary | ICD-10-CM | POA: Diagnosis not present

## 2017-01-16 DIAGNOSIS — M5441 Lumbago with sciatica, right side: Secondary | ICD-10-CM | POA: Diagnosis not present

## 2017-01-16 DIAGNOSIS — M5489 Other dorsalgia: Secondary | ICD-10-CM | POA: Diagnosis not present

## 2017-01-17 DIAGNOSIS — I5040 Unspecified combined systolic (congestive) and diastolic (congestive) heart failure: Secondary | ICD-10-CM | POA: Diagnosis not present

## 2017-01-17 DIAGNOSIS — J449 Chronic obstructive pulmonary disease, unspecified: Secondary | ICD-10-CM | POA: Diagnosis not present

## 2017-01-22 DIAGNOSIS — M5441 Lumbago with sciatica, right side: Secondary | ICD-10-CM | POA: Diagnosis not present

## 2017-01-22 DIAGNOSIS — M5489 Other dorsalgia: Secondary | ICD-10-CM | POA: Diagnosis not present

## 2017-01-27 DIAGNOSIS — M5489 Other dorsalgia: Secondary | ICD-10-CM | POA: Diagnosis not present

## 2017-01-27 DIAGNOSIS — M5441 Lumbago with sciatica, right side: Secondary | ICD-10-CM | POA: Diagnosis not present

## 2017-01-29 DIAGNOSIS — M5441 Lumbago with sciatica, right side: Secondary | ICD-10-CM | POA: Diagnosis not present

## 2017-01-29 DIAGNOSIS — M5489 Other dorsalgia: Secondary | ICD-10-CM | POA: Diagnosis not present

## 2017-02-03 ENCOUNTER — Encounter: Payer: Self-pay | Admitting: Registered Nurse

## 2017-02-03 ENCOUNTER — Encounter: Payer: Medicare HMO | Attending: Physical Medicine & Rehabilitation | Admitting: Registered Nurse

## 2017-02-03 VITALS — BP 110/68 | HR 73

## 2017-02-03 DIAGNOSIS — F419 Anxiety disorder, unspecified: Secondary | ICD-10-CM | POA: Insufficient documentation

## 2017-02-03 DIAGNOSIS — F329 Major depressive disorder, single episode, unspecified: Secondary | ICD-10-CM | POA: Diagnosis not present

## 2017-02-03 DIAGNOSIS — I5032 Chronic diastolic (congestive) heart failure: Secondary | ICD-10-CM | POA: Insufficient documentation

## 2017-02-03 DIAGNOSIS — M5136 Other intervertebral disc degeneration, lumbar region: Secondary | ICD-10-CM

## 2017-02-03 DIAGNOSIS — M5416 Radiculopathy, lumbar region: Secondary | ICD-10-CM | POA: Diagnosis not present

## 2017-02-03 DIAGNOSIS — M1712 Unilateral primary osteoarthritis, left knee: Secondary | ICD-10-CM | POA: Diagnosis not present

## 2017-02-03 DIAGNOSIS — Z5181 Encounter for therapeutic drug level monitoring: Secondary | ICD-10-CM

## 2017-02-03 DIAGNOSIS — F17210 Nicotine dependence, cigarettes, uncomplicated: Secondary | ICD-10-CM | POA: Insufficient documentation

## 2017-02-03 DIAGNOSIS — E039 Hypothyroidism, unspecified: Secondary | ICD-10-CM | POA: Diagnosis not present

## 2017-02-03 DIAGNOSIS — J449 Chronic obstructive pulmonary disease, unspecified: Secondary | ICD-10-CM | POA: Diagnosis not present

## 2017-02-03 DIAGNOSIS — R0902 Hypoxemia: Secondary | ICD-10-CM | POA: Diagnosis not present

## 2017-02-03 DIAGNOSIS — G8929 Other chronic pain: Secondary | ICD-10-CM | POA: Diagnosis not present

## 2017-02-03 DIAGNOSIS — M47816 Spondylosis without myelopathy or radiculopathy, lumbar region: Secondary | ICD-10-CM

## 2017-02-03 DIAGNOSIS — Z79899 Other long term (current) drug therapy: Secondary | ICD-10-CM | POA: Diagnosis not present

## 2017-02-03 DIAGNOSIS — M545 Low back pain: Secondary | ICD-10-CM | POA: Diagnosis not present

## 2017-02-03 DIAGNOSIS — R000 Tachycardia, unspecified: Secondary | ICD-10-CM | POA: Insufficient documentation

## 2017-02-03 DIAGNOSIS — M7062 Trochanteric bursitis, left hip: Secondary | ICD-10-CM | POA: Diagnosis not present

## 2017-02-03 DIAGNOSIS — G894 Chronic pain syndrome: Secondary | ICD-10-CM

## 2017-02-03 MED ORDER — OXYCODONE-ACETAMINOPHEN 10-325 MG PO TABS: 1.0000 | ORAL_TABLET | Freq: Three times a day (TID) | ORAL | 0 refills | Status: DC | PRN

## 2017-02-03 NOTE — Progress Notes (Signed)
Subjective:    Patient ID: Ashley Houston, female    DOB: 10-08-1940, 76 y.o.   MRN: [id number]  HPI: Ashley Houston is a 76 year old female who returns for follow up appointment for chronic pain and medication refill. She states she is pain free at this time her usual pain is located in her lower back radiating into her left hip also has left knee pain. She rates her pain 0. Her current exercise regime is attending physical therapy twice a week and walking.   Ashley Houston arrived to office with oxygen desaturation,  saturation was checked several times and vitals have been reviewed from previous visits over the last few months. Ashley Houston states she uses oxygen at home only at [facility]. Refuses ED evaluation states she will follow up with her PCP, she doesn't have a pulmonologist. Also educated on smoking cessation, she verbalizes understanding.   Daughter in Room.  Oral Swab Performed on 12/02/2016 it was consistent.   Pain Inventory Average Pain 10 Pain Right Now 0 My pain is dull and aching  In the last 24 hours, has pain interfered with the following? General activity 8 Relation with others 8 Enjoyment of life 7 What TIME of day is your pain at its worst? all Sleep (in general) Good  Pain is worse with: walking, bending, sitting, standing and some activites Pain improves with: rest, heat/ice, therapy/exercise and medication Relief from Meds: 10  Mobility walk without assistance ability to climb steps?  no  Function retired  Neuro/Psych bladder control problems bowel control problems weakness spasms depression anxiety  Prior Studies Any changes since last visit?  no  Physicians involved in your care Any changes since last visit?  no   No family history on file. Social History   Social History  . Marital status: Widowed    Spouse name: N/A  . Number of children: N/A  . Years of education: N/A   Social History Main Topics  . Smoking status: Current  Every Day Smoker    Packs/day: 0.50    Types: Cigarettes  . Smokeless tobacco: Never Used  . Alcohol use No  . Drug use: No  . Sexual activity: Not on file   Other Topics Concern  . Not on file   Social History Narrative  . No narrative on file   Past Surgical History:  Procedure Laterality Date  . ABDOMINAL HYSTERECTOMY    . CHOLECYSTECTOMY    . TONSILLECTOMY     Past Medical History:  Diagnosis Date  . Anxiety   . Chronic diastolic heart failure (HCC)   . Chronic pain    hips back and shoulders  . COPD (chronic obstructive pulmonary disease) (HCC)   . Depression   . Hypothyroidism 09/08/2015  . Tachycardia   . Thyroid disease    BP 110/68   Pulse 73   SpO2 (!) 88%   Opioid Risk Score:   Fall Risk Score:  `1  Depression screen PHQ 2/9  Depression screen [facility] 2/9 02/03/2017 12/02/2016 08/20/2016 09/21/2015  Decreased Interest 1 0 1 0  Down, Depressed, Hopeless 1 0 0 0  PHQ - 2 Score 2 0 1 0  Altered sleeping - - 0 -  Tired, decreased energy - - 0 -  Change in appetite - - 0 -  Feeling bad or failure about yourself  - - 0 -  Trouble concentrating - - 0 -  Moving slowly or fidgety/restless - - 0 -  Suicidal thoughts - -  0 -  PHQ-9 Score - - 1 -  Difficult doing work/chores - - Somewhat difficult -     Review of Systems  Constitutional: Negative.   HENT: Negative.   Eyes: Negative.   Respiratory: Positive for shortness of breath.   Cardiovascular: Negative.   Gastrointestinal: Negative.   Endocrine: Negative.   Genitourinary: Negative.   Musculoskeletal: Negative.   Skin: Negative.   Allergic/Immunologic: Negative.   Neurological: Negative.   Hematological: Negative.   Psychiatric/Behavioral: Negative.   All other systems reviewed and are negative.      Objective:   Physical Exam  Constitutional: She is oriented to person, place, and time. She appears well-developed and well-nourished.  HENT:  Head: Normocephalic and atraumatic.  Neck: Normal  range of motion. Neck supple.  Cardiovascular: Normal rate and regular rhythm.   Pulmonary/Chest: Effort normal and breath sounds normal.  Musculoskeletal:  Normal Muscle Bulk and Muscle Testing Reveals:  Upper Extremities: Full ROM and Muscle Strength 4/5 Back without spinal tenderness noted Left Greater Trochanter Tenderness Lower Extremities: Full ROM and Muscle Strength 5/5 Arises from Table with ease Narrow Based Gait  Neurological: She is alert and oriented to person, place, and time.  Skin: Skin is warm and dry.  Psychiatric: She has a normal mood and affect.  Nursing note and vitals reviewed.         Assessment & Plan:  1. 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 02/03/2017 2. OA of Left Knee: Refilled Oxycodone 10/325 mg one tablet every 8 hours as needed #90. 02/03/2017 We will continue the opioid monitoring program, this consists of regular clinic visits, examinations, urine drug screen, pill counts as well as use of [place] Controlled Substance reporting System.  Continue Voltaren Gel and HEP as tolerated 3. Muscle Spasms: Continue Flexeril as needed. 02/03/2017  20 minutes of face to face patient care time was spent during this visit. All questions were encouraged and answered.  F/U in 1 month

## 2017-02-05 DIAGNOSIS — M5489 Other dorsalgia: Secondary | ICD-10-CM | POA: Diagnosis not present

## 2017-02-05 DIAGNOSIS — M5441 Lumbago with sciatica, right side: Secondary | ICD-10-CM | POA: Diagnosis not present

## 2017-02-11 DIAGNOSIS — M5441 Lumbago with sciatica, right side: Secondary | ICD-10-CM | POA: Diagnosis not present

## 2017-02-11 DIAGNOSIS — M545 Low back pain: Secondary | ICD-10-CM | POA: Diagnosis not present

## 2017-02-13 DIAGNOSIS — J209 Acute bronchitis, unspecified: Secondary | ICD-10-CM | POA: Diagnosis not present

## 2017-02-13 DIAGNOSIS — Z6821 Body mass index (BMI) 21.0-21.9, adult: Secondary | ICD-10-CM | POA: Diagnosis not present

## 2017-02-16 DIAGNOSIS — J449 Chronic obstructive pulmonary disease, unspecified: Secondary | ICD-10-CM | POA: Diagnosis not present

## 2017-02-16 DIAGNOSIS — I5040 Unspecified combined systolic (congestive) and diastolic (congestive) heart failure: Secondary | ICD-10-CM | POA: Diagnosis not present

## 2017-02-17 DIAGNOSIS — M5441 Lumbago with sciatica, right side: Secondary | ICD-10-CM | POA: Diagnosis not present

## 2017-02-17 DIAGNOSIS — M545 Low back pain: Secondary | ICD-10-CM | POA: Diagnosis not present

## 2017-02-27 DIAGNOSIS — M5441 Lumbago with sciatica, right side: Secondary | ICD-10-CM | POA: Diagnosis not present

## 2017-02-27 DIAGNOSIS — M545 Low back pain: Secondary | ICD-10-CM | POA: Diagnosis not present

## 2017-03-04 ENCOUNTER — Encounter: Payer: Medicare HMO | Attending: Physical Medicine & Rehabilitation | Admitting: Physical Medicine & Rehabilitation

## 2017-03-04 ENCOUNTER — Encounter: Payer: Self-pay | Admitting: Physical Medicine & Rehabilitation

## 2017-03-04 VITALS — BP 99/63 | HR 77

## 2017-03-04 DIAGNOSIS — M47816 Spondylosis without myelopathy or radiculopathy, lumbar region: Secondary | ICD-10-CM | POA: Diagnosis not present

## 2017-03-04 DIAGNOSIS — M1712 Unilateral primary osteoarthritis, left knee: Secondary | ICD-10-CM | POA: Diagnosis not present

## 2017-03-04 DIAGNOSIS — G8929 Other chronic pain: Secondary | ICD-10-CM | POA: Insufficient documentation

## 2017-03-04 DIAGNOSIS — M7062 Trochanteric bursitis, left hip: Secondary | ICD-10-CM | POA: Diagnosis not present

## 2017-03-04 DIAGNOSIS — M545 Low back pain: Secondary | ICD-10-CM | POA: Diagnosis not present

## 2017-03-04 DIAGNOSIS — J449 Chronic obstructive pulmonary disease, unspecified: Secondary | ICD-10-CM | POA: Insufficient documentation

## 2017-03-04 DIAGNOSIS — I5032 Chronic diastolic (congestive) heart failure: Secondary | ICD-10-CM | POA: Diagnosis not present

## 2017-03-04 DIAGNOSIS — E039 Hypothyroidism, unspecified: Secondary | ICD-10-CM | POA: Diagnosis not present

## 2017-03-04 DIAGNOSIS — F17210 Nicotine dependence, cigarettes, uncomplicated: Secondary | ICD-10-CM | POA: Diagnosis not present

## 2017-03-04 DIAGNOSIS — F329 Major depressive disorder, single episode, unspecified: Secondary | ICD-10-CM | POA: Diagnosis not present

## 2017-03-04 DIAGNOSIS — F419 Anxiety disorder, unspecified: Secondary | ICD-10-CM | POA: Diagnosis not present

## 2017-03-04 DIAGNOSIS — R000 Tachycardia, unspecified: Secondary | ICD-10-CM | POA: Diagnosis not present

## 2017-03-04 MED ORDER — OXYCODONE-ACETAMINOPHEN 10-325 MG PO TABS: 1.0000 | ORAL_TABLET | Freq: Three times a day (TID) | ORAL | 0 refills | Status: DC | PRN

## 2017-03-04 NOTE — Patient Instructions (Signed)
WORK ON CUTTING OUT THE CIGARETTES. YOU HAVE PLENTY OF REASONS WHY TO STOP SMOKING!!!   PLEASE FEEL FREE TO CALL OUR OFFICE WITH ANY PROBLEMS OR QUESTIONS [phone])

## 2017-03-04 NOTE — Progress Notes (Signed)
Subjective:    Patient ID: Ashley Houston, female    DOB: [birth year], 76 y.o.   MRN: [id number]  HPI Ashley Houston is here in follow up of her chronic pain. She has been managing fairly well. The therappy continues to be extremely helpful. She is perfomring some HEP activities on her own as well. She is tolerating medication additionally. She uses miralax for intermittent constipation. Her pain is still present but not nearly what it was before  She continues to smoke a half pack of cigarettes per day. She understands some of the consequences of smoking. She was admitted not to long ago with CAP and COPD exacerbation   Pain Inventory Average Pain 10 Pain Right Now 4 My pain is dull and aching  In the last 24 hours, has pain interfered with the following? General activity 7 Relation with others 6 Enjoyment of life 7 What TIME of day is your pain at its worst? daytime Sleep (in general) Good  Pain is worse with: walking, bending, sitting, standing and some activites Pain improves with: rest, heat/ice, therapy/exercise and medication Relief from Meds: 10  Mobility walk without assistance needs help with transfers  Function I need assistance with the following:  meal prep, household duties and shopping  Neuro/Psych spasms depression anxiety  Prior Studies Any changes since last visit?  no  Physicians involved in your care Any changes since last visit?  no   No family history on file. Social History   Social History  . Marital status: Widowed    Spouse name: N/A  . Number of children: N/A  . Years of education: N/A   Social History Main Topics  . Smoking status: Current Every Day Smoker    Packs/day: 0.50    Types: Cigarettes  . Smokeless tobacco: Never Used  . Alcohol use No  . Drug use: No  . Sexual activity: Not Asked   Other Topics Concern  . None   Social History Narrative  . None   Past Surgical History:  Procedure Laterality Date  . ABDOMINAL  HYSTERECTOMY    . CHOLECYSTECTOMY    . TONSILLECTOMY     Past Medical History:  Diagnosis Date  . Anxiety   . Chronic diastolic heart failure (HCC)   . Chronic pain    hips back and shoulders  . COPD (chronic obstructive pulmonary disease) (HCC)   . Depression   . Hypothyroidism 09/08/2015  . Tachycardia   . Thyroid disease    BP 99/63   Pulse 77   SpO2 91%    Opioid Risk Score:   Fall Risk Score:  `1  Depression screen PHQ 2/9  Depression screen [facility] 2/9 02/03/2017 12/02/2016 08/20/2016 09/21/2015  Decreased Interest 1 0 1 0  Down, Depressed, Hopeless 1 0 0 0  PHQ - 2 Score 2 0 1 0  Altered sleeping - - 0 -  Tired, decreased energy - - 0 -  Change in appetite - - 0 -  Feeling bad or failure about yourself  - - 0 -  Trouble concentrating - - 0 -  Moving slowly or fidgety/restless - - 0 -  Suicidal thoughts - - 0 -  PHQ-9 Score - - 1 -  Difficult doing work/chores - - Somewhat difficult -     Review of Systems  Constitutional: Negative.   HENT: Negative.   Eyes: Negative.   Respiratory: Positive for shortness of breath.   Cardiovascular: Negative.   Gastrointestinal: Positive for constipation and diarrhea.  Endocrine: Negative.   Genitourinary: Negative.   Musculoskeletal: Negative.   Skin: Negative.   Allergic/Immunologic: Negative.   Neurological: Negative.   Hematological: Negative.   Psychiatric/Behavioral: Negative.   All other systems reviewed and are negative.      Objective:   Physical Exam   General: Alert and oriented x 3, No apparent distress HEENT:Head is normocephalic, atraumatic, PERRLA, EOMI, sclera anicteric, oral mucosa pink and moist, dentition intact, ext ear canals clear,  Neck:Supple without JVD or lymphadenopathy Heart:RRR  Chest: CTA oxygen sat 91% Abdomen:Soft, non-tender, non-distended, bowel sounds positive. Extremities:No clubbing, cyanosis, or edema. Pulses are 2+ Skin:Clean and intact without signs of  breakdown Neuro:Pt is cognitively appropriate with normal insight, memory, and awareness. Cranial nerves 2-12 are intact. Sensory exam is normal. Reflexes are 2+ in all 4's. Fine motor coordination is intact. No tremors. Motor function is grossly 5/5.  Musculoskeletal: nearly 75-80 degrees although still taut along right thoraco-lumbar paraspinals and tends to lean more to the left. Extension and facet maneuvers cause some pain. RIGHT lumbar paraspinals sl TTP. Minimal antalgia today.  Psych:Pt's affect is appropriate. Pt is cooperative       Assessment & Plan:  1. 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  2. Left greater trochanter bursitis 3. OA left knee, ?meniscal injury 4. Tobacco abuse  Plan:  1. Continuevoltaren gel for left knee and left hip--gel has helped quite a bit. Still can consider left knee injection as needed.   2. Continue flexeril for back spasms, 10mg  q8 prn. #60.              - heating pad in the morning and before stretching.   4. Ice to hips shall continue   5. Continuepercocet 10/325, one q8 prn, #90. We will continue the opioid monitoring program, this consists of regular clinic visits, examinations, routine drug screening, pill counts as well as use of [place] Controlled Substance Reporting System. [place] was [place] today.   6. Continue therapy at [facility] [place]. for her degenerative disc disease and spondylosis. Continue with pilates based principles to improve lumbar rom, strength, length, etc. Discussed the transition to a HEP alone  7. Considering appearance of most recent MRI, an ESI might be appropriate. However, she is doing so well, that there is no need to pursue this currently. 8. Smoking cessation was discussed as it pertains to her overall health and pain presentation  Fifteen minutes of face to face  patient care time were spent during this visit. All questions were encouraged and answered.  Follow up in a month with NP

## 2017-03-10 DIAGNOSIS — M545 Low back pain: Secondary | ICD-10-CM | POA: Diagnosis not present

## 2017-03-10 DIAGNOSIS — M5441 Lumbago with sciatica, right side: Secondary | ICD-10-CM | POA: Diagnosis not present

## 2017-03-13 DIAGNOSIS — M545 Low back pain: Secondary | ICD-10-CM | POA: Diagnosis not present

## 2017-03-13 DIAGNOSIS — M5441 Lumbago with sciatica, right side: Secondary | ICD-10-CM | POA: Diagnosis not present

## 2017-03-17 DIAGNOSIS — M545 Low back pain: Secondary | ICD-10-CM | POA: Diagnosis not present

## 2017-03-17 DIAGNOSIS — M5441 Lumbago with sciatica, right side: Secondary | ICD-10-CM | POA: Diagnosis not present

## 2017-03-19 DIAGNOSIS — J449 Chronic obstructive pulmonary disease, unspecified: Secondary | ICD-10-CM | POA: Diagnosis not present

## 2017-03-19 DIAGNOSIS — I5040 Unspecified combined systolic (congestive) and diastolic (congestive) heart failure: Secondary | ICD-10-CM | POA: Diagnosis not present

## 2017-03-24 DIAGNOSIS — M545 Low back pain: Secondary | ICD-10-CM | POA: Diagnosis not present

## 2017-03-24 DIAGNOSIS — M5441 Lumbago with sciatica, right side: Secondary | ICD-10-CM | POA: Diagnosis not present

## 2017-03-25 DIAGNOSIS — H5200 Hypermetropia, unspecified eye: Secondary | ICD-10-CM | POA: Diagnosis not present

## 2017-03-26 DIAGNOSIS — M5441 Lumbago with sciatica, right side: Secondary | ICD-10-CM | POA: Diagnosis not present

## 2017-03-26 DIAGNOSIS — M545 Low back pain: Secondary | ICD-10-CM | POA: Diagnosis not present

## 2017-04-06 ENCOUNTER — Other Ambulatory Visit: Payer: Self-pay

## 2017-04-06 ENCOUNTER — Encounter: Payer: Self-pay | Admitting: Registered Nurse

## 2017-04-06 ENCOUNTER — Encounter: Payer: Medicare HMO | Attending: Physical Medicine & Rehabilitation | Admitting: Registered Nurse

## 2017-04-06 ENCOUNTER — Telehealth: Payer: Self-pay | Admitting: Registered Nurse

## 2017-04-06 VITALS — BP 113/67 | HR 78

## 2017-04-06 DIAGNOSIS — F17210 Nicotine dependence, cigarettes, uncomplicated: Secondary | ICD-10-CM | POA: Insufficient documentation

## 2017-04-06 DIAGNOSIS — I5032 Chronic diastolic (congestive) heart failure: Secondary | ICD-10-CM | POA: Insufficient documentation

## 2017-04-06 DIAGNOSIS — J449 Chronic obstructive pulmonary disease, unspecified: Secondary | ICD-10-CM | POA: Diagnosis not present

## 2017-04-06 DIAGNOSIS — R000 Tachycardia, unspecified: Secondary | ICD-10-CM | POA: Insufficient documentation

## 2017-04-06 DIAGNOSIS — G894 Chronic pain syndrome: Secondary | ICD-10-CM

## 2017-04-06 DIAGNOSIS — Z5181 Encounter for therapeutic drug level monitoring: Secondary | ICD-10-CM | POA: Diagnosis not present

## 2017-04-06 DIAGNOSIS — E039 Hypothyroidism, unspecified: Secondary | ICD-10-CM | POA: Diagnosis not present

## 2017-04-06 DIAGNOSIS — M5136 Other intervertebral disc degeneration, lumbar region: Secondary | ICD-10-CM

## 2017-04-06 DIAGNOSIS — F329 Major depressive disorder, single episode, unspecified: Secondary | ICD-10-CM | POA: Diagnosis not present

## 2017-04-06 DIAGNOSIS — M7062 Trochanteric bursitis, left hip: Secondary | ICD-10-CM | POA: Diagnosis not present

## 2017-04-06 DIAGNOSIS — M47816 Spondylosis without myelopathy or radiculopathy, lumbar region: Secondary | ICD-10-CM | POA: Diagnosis not present

## 2017-04-06 DIAGNOSIS — F419 Anxiety disorder, unspecified: Secondary | ICD-10-CM | POA: Diagnosis not present

## 2017-04-06 DIAGNOSIS — M1712 Unilateral primary osteoarthritis, left knee: Secondary | ICD-10-CM | POA: Diagnosis not present

## 2017-04-06 DIAGNOSIS — M545 Low back pain: Secondary | ICD-10-CM | POA: Diagnosis not present

## 2017-04-06 DIAGNOSIS — M51369 Other intervertebral disc degeneration, lumbar region without mention of lumbar back pain or lower extremity pain: Secondary | ICD-10-CM

## 2017-04-06 DIAGNOSIS — Z79899 Other long term (current) drug therapy: Secondary | ICD-10-CM | POA: Diagnosis not present

## 2017-04-06 DIAGNOSIS — G8929 Other chronic pain: Secondary | ICD-10-CM | POA: Diagnosis not present

## 2017-04-06 MED ORDER — OXYCODONE-ACETAMINOPHEN 10-325 MG PO TABS: 1.0000 | ORAL_TABLET | Freq: Three times a day (TID) | ORAL | 0 refills | Status: DC | PRN

## 2017-04-06 MED ORDER — DICLOFENAC SODIUM 1 % TD GEL: 1.0000 "application " | Freq: Three times a day (TID) | TRANSDERMAL | 4 refills | Status: DC

## 2017-04-06 NOTE — Progress Notes (Signed)
Subjective:    Patient ID: Ashley EconomyPhyllis Houston, female    DOB: 08/21/1940, 76 y.o.   MRN: [id number]  HPI: Ashley Houston is a 76 year old female who returns for follow up appointment for chronic pain and medication refill. She states she is pain free at this time, states she had her medication this morning and her usual pain is located in her lower back pain, left hip and left knee pain. She rates her pain 0. Her current exercise regime is attending physical therapy twice a week and walking.   Ashley Houston arrived to office with oxygen desaturation, O2  saturation was checked several times remains 86-88%. Ashley Houston wearing acrylic nail and no distress is noted.  Ashley Houston states she uses her oxygen at home only [facility]. Also instructed to follow up with her Pulmonologist.    Also educated on smoking cessation, she verbalizes understanding.   Ashley Houston Morphine equivalent is  45.00  MME, we reviewed the PMP Aware Website all questions answered.  She is also prescribed Lorazepam by Dr. Yetta BarreJones PCP/ Lianne MorisErin Carroll PA..We have discussed the black box warning of using opioids and benzodiazepines. I highlighted the dangers of using these drugs together and discussed the adverse events including respiratory suppression, overdose, cognitive impairment and importance of  compliance with current regimen. Sheverbalizes understanding, we will continue to monitor and adjust as indicated.     Daughter in Room.  Oral Swab Performed on 12/02/2016 it was consistent.   Pain Inventory Average Pain 10 Pain Right Now 0 My pain is dull and aching  In the last 24 hours, has pain interfered with the following? General activity 8 Relation with others 7 Enjoyment of life 7 What TIME of day is your pain at its worst? all Sleep (in general) Good  Pain is worse with: walking, bending, sitting, standing and some activites Pain improves with: rest, heat/ice, therapy/exercise and medication Relief from Meds:  10  Mobility walk without assistance ability to climb steps?  no  Function retired  Neuro/Psych bladder control problems bowel control problems weakness spasms depression anxiety  Prior Studies Any changes since last visit?  no  Physicians involved in your care Any changes since last visit?  no   No family history on file. Social History   Socioeconomic History  . Marital status: Widowed    Spouse name: Not on file  . Number of children: Not on file  . Years of education: Not on file  . Highest education level: Not on file  Social Needs  . Financial resource strain: Not on file  . Food insecurity - worry: Not on file  . Food insecurity - inability: Not on file  . Transportation needs - medical: Not on file  . Transportation needs - non-medical: Not on file  Occupational History  . Not on file  Tobacco Use  . Smoking status: Current Every Day Smoker    Packs/day: 0.50    Types: Cigarettes  . Smokeless tobacco: Never Used  Substance and Sexual Activity  . Alcohol use: No  . Drug use: No  . Sexual activity: Not on file  Other Topics Concern  . Not on file  Social History Narrative  . Not on file   Past Surgical History:  Procedure Laterality Date  . ABDOMINAL HYSTERECTOMY    . CHOLECYSTECTOMY    . TONSILLECTOMY     Past Medical History:  Diagnosis Date  . Anxiety   . Chronic diastolic heart failure (HCC)   .  Chronic pain    hips back and shoulders  . COPD (chronic obstructive pulmonary disease) (HCC)   . Depression   . Hypothyroidism 09/08/2015  . Tachycardia   . Thyroid disease    There were no vitals taken for this visit.  Opioid Risk Score:   Fall Risk Score:  `1  Depression screen PHQ 2/9  [facility] 02/03/2017 12/02/2016 08/20/2016 09/21/2015  Decreased Interest 1 0 1 0  Down, Depressed, Hopeless 1 0 0 0  PHQ - 2 Score 2 0 1 0  Altered sleeping - - 0 -  Tired, decreased energy - - 0 -  Change in appetite - - 0 -    Feeling bad or failure about yourself  - - 0 -  Trouble concentrating - - 0 -  Moving slowly or fidgety/restless - - 0 -  Suicidal thoughts - - 0 -  PHQ-9 Score - - 1 -  Difficult doing work/chores - - Somewhat difficult -     Review of Systems  Constitutional: Negative.   HENT: Negative.   Eyes: Negative.   Respiratory: Positive for shortness of breath.   Cardiovascular: Negative.   Gastrointestinal: Negative.   Endocrine: Negative.   Genitourinary: Negative.   Musculoskeletal: Negative.   Skin: Negative.   Allergic/Immunologic: Negative.   Neurological: Negative.   Hematological: Negative.   Psychiatric/Behavioral: Positive for dysphoric mood. The patient is nervous/anxious.   All other systems reviewed and are negative.      Objective:   Physical Exam  Constitutional: She is oriented to person, place, and time. She appears well-developed and well-nourished.  HENT:  Head: Normocephalic and atraumatic.  Neck: Normal range of motion. Neck supple.  Cardiovascular: Normal rate and regular rhythm.  Pulmonary/Chest: Effort normal and breath sounds normal.  Musculoskeletal:  Normal Muscle Bulk and Muscle Testing Reveals:  Upper Extremities: Full ROM and Muscle Strength 4/5 Back without spinal tenderness noted Left Greater Trochanter Tenderness Lower Extremities: Full ROM and Muscle Strength 5/5 Arises from Table with ease Narrow Based Gait  Neurological: She is alert and oriented to person, place, and time.  Skin: Skin is warm and dry.  Psychiatric: She has a normal mood and affect.  Nursing note and vitals reviewed.         Assessment & Plan:  1. 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 04/06/2017 2. OA of Left Knee: Refilled Oxycodone 10/325 mg one tablet every 8 hours as needed #90. 04/06/2017. ( Only one Script Given). We will continue the opioid monitoring program, this consists of regular  clinic visits, examinations, urine drug screen, pill counts as well as use of [place] Controlled Substance reporting System. Continue Voltaren Gel and HEP as tolerated. 3. Muscle Spasms: Continue Flexeril as needed. 04/06/2017  20 minutes of face to face patient care time was spent during this visit. All questions were encouraged and answered.  F/U in 1 month

## 2017-04-06 NOTE — Telephone Encounter (Signed)
On 04/06/2017 the  NCCSR was reviewed no conflict was seen on the [facility] [place] [facility] Reporting System with multiple prescribers. [name] has a signed narcotic contract with our office. If there were any discrepancies this would have been reported to her physician.

## 2017-04-08 DIAGNOSIS — M5441 Lumbago with sciatica, right side: Secondary | ICD-10-CM | POA: Diagnosis not present

## 2017-04-08 DIAGNOSIS — M545 Low back pain: Secondary | ICD-10-CM | POA: Diagnosis not present

## 2017-04-14 DIAGNOSIS — R531 Weakness: Secondary | ICD-10-CM | POA: Diagnosis not present

## 2017-04-14 DIAGNOSIS — Z6821 Body mass index (BMI) 21.0-21.9, adult: Secondary | ICD-10-CM | POA: Diagnosis not present

## 2017-04-14 DIAGNOSIS — J209 Acute bronchitis, unspecified: Secondary | ICD-10-CM | POA: Diagnosis not present

## 2017-04-18 DIAGNOSIS — J449 Chronic obstructive pulmonary disease, unspecified: Secondary | ICD-10-CM | POA: Diagnosis not present

## 2017-04-18 DIAGNOSIS — I5040 Unspecified combined systolic (congestive) and diastolic (congestive) heart failure: Secondary | ICD-10-CM | POA: Diagnosis not present

## 2017-04-24 DIAGNOSIS — R531 Weakness: Secondary | ICD-10-CM | POA: Diagnosis not present

## 2017-04-24 DIAGNOSIS — E039 Hypothyroidism, unspecified: Secondary | ICD-10-CM | POA: Diagnosis not present

## 2017-04-24 DIAGNOSIS — R5383 Other fatigue: Secondary | ICD-10-CM | POA: Diagnosis not present

## 2017-04-24 DIAGNOSIS — Z6821 Body mass index (BMI) 21.0-21.9, adult: Secondary | ICD-10-CM | POA: Diagnosis not present

## 2017-04-30 ENCOUNTER — Encounter: Payer: Self-pay | Admitting: Registered Nurse

## 2017-04-30 ENCOUNTER — Encounter: Payer: Medicare HMO | Attending: Physical Medicine & Rehabilitation | Admitting: Registered Nurse

## 2017-04-30 VITALS — BP 117/62 | HR 74

## 2017-04-30 DIAGNOSIS — F329 Major depressive disorder, single episode, unspecified: Secondary | ICD-10-CM | POA: Diagnosis not present

## 2017-04-30 DIAGNOSIS — R000 Tachycardia, unspecified: Secondary | ICD-10-CM | POA: Diagnosis not present

## 2017-04-30 DIAGNOSIS — G8929 Other chronic pain: Secondary | ICD-10-CM | POA: Diagnosis not present

## 2017-04-30 DIAGNOSIS — G894 Chronic pain syndrome: Secondary | ICD-10-CM

## 2017-04-30 DIAGNOSIS — I5032 Chronic diastolic (congestive) heart failure: Secondary | ICD-10-CM | POA: Insufficient documentation

## 2017-04-30 DIAGNOSIS — M51369 Other intervertebral disc degeneration, lumbar region without mention of lumbar back pain or lower extremity pain: Secondary | ICD-10-CM

## 2017-04-30 DIAGNOSIS — M5136 Other intervertebral disc degeneration, lumbar region: Secondary | ICD-10-CM | POA: Diagnosis not present

## 2017-04-30 DIAGNOSIS — J449 Chronic obstructive pulmonary disease, unspecified: Secondary | ICD-10-CM | POA: Insufficient documentation

## 2017-04-30 DIAGNOSIS — M47816 Spondylosis without myelopathy or radiculopathy, lumbar region: Secondary | ICD-10-CM | POA: Diagnosis not present

## 2017-04-30 DIAGNOSIS — Z5181 Encounter for therapeutic drug level monitoring: Secondary | ICD-10-CM

## 2017-04-30 DIAGNOSIS — F17210 Nicotine dependence, cigarettes, uncomplicated: Secondary | ICD-10-CM | POA: Insufficient documentation

## 2017-04-30 DIAGNOSIS — E039 Hypothyroidism, unspecified: Secondary | ICD-10-CM | POA: Diagnosis not present

## 2017-04-30 DIAGNOSIS — Z79899 Other long term (current) drug therapy: Secondary | ICD-10-CM | POA: Diagnosis not present

## 2017-04-30 DIAGNOSIS — M1712 Unilateral primary osteoarthritis, left knee: Secondary | ICD-10-CM | POA: Diagnosis not present

## 2017-04-30 DIAGNOSIS — R0902 Hypoxemia: Secondary | ICD-10-CM | POA: Diagnosis not present

## 2017-04-30 DIAGNOSIS — F419 Anxiety disorder, unspecified: Secondary | ICD-10-CM | POA: Diagnosis not present

## 2017-04-30 DIAGNOSIS — M7062 Trochanteric bursitis, left hip: Secondary | ICD-10-CM | POA: Diagnosis not present

## 2017-04-30 DIAGNOSIS — M545 Low back pain: Secondary | ICD-10-CM | POA: Diagnosis not present

## 2017-04-30 MED ORDER — OXYCODONE-ACETAMINOPHEN 10-325 MG PO TABS: 1.0000 | ORAL_TABLET | Freq: Three times a day (TID) | ORAL | 0 refills | Status: DC | PRN

## 2017-04-30 NOTE — Progress Notes (Signed)
Subjective:    Patient ID: Ashley EconomyPhyllis Houston, female    DOB: 05/13/1940, 76 y.o.   MRN: [id number]  HPI: [name] is a 76 year old female who returns for follow up appointment for chronic pain and medication refill. She states she is pain free at this time, states she had her medication this morning and her usual pain is located in her lower back pain, left hip and left knee pain. She rates her pain 0. Her current exercise regime is attending physical therapy twice a week and walking.   Ashley Houston arrived to office with oxygen desaturation, O2  saturation  87 %. Ashley Houston wearing acrylic nail and no distress is noted.  Ashley Houston states she uses her oxygen at home, she doesn't have portable oxygen tanks, she is in the process of calling her oxygen company.. Also instructed to follow up with her Pulmonologist.    Also educated on smoking cessation, she verbalizes understanding.   Ashley Houston Morphine equivalent is  45.00  MME. She is also prescribed Lorazepam by Dr. Yetta BarreJones PCP/ Lianne MorisErin Carroll PA..We have reviewed the black box warning again in regards of using opioids and benzodiazepines. I highlighted the dangers of using these drugs together and discussed the adverse events including respiratory suppression, overdose, cognitive impairment and importance of  compliance with current regimen. Sheverbalizes understanding, we will continue to monitor and adjust as indicated.    Daughter in Room.  Oral Swab Performed on 12/02/2016 it was consistent.   Pain Inventory Average Pain 10 Pain Right Now 0 My pain is dull and aching  In the last 24 hours, has pain interfered with the following? General activity 5 Relation with others 5 Enjoyment of life 7 What TIME of day is your pain at its worst? all Sleep (in general) Good  Pain is worse with: walking, bending, sitting, standing and some activites Pain improves with: rest, heat/ice, therapy/exercise and medication Relief from  Meds: 10  Mobility walk without assistance ability to climb steps?  no  Function retired  Neuro/Psych bladder control problems bowel control problems weakness spasms depression anxiety  Prior Studies Any changes since last visit?  no  Physicians involved in your care Any changes since last visit?  no   No family history on file. Social History   Socioeconomic History  . Marital status: Widowed    Spouse name: Not on file  . Number of children: Not on file  . Years of education: Not on file  . Highest education level: Not on file  Social Needs  . Financial resource strain: Not on file  . Food insecurity - worry: Not on file  . Food insecurity - inability: Not on file  . Transportation needs - medical: Not on file  . Transportation needs - non-medical: Not on file  Occupational History  . Not on file  Tobacco Use  . Smoking status: Current Every Day Smoker    Packs/day: 0.50    Types: Cigarettes  . Smokeless tobacco: Never Used  Substance and Sexual Activity  . Alcohol use: No  . Drug use: No  . Sexual activity: Not on file  Other Topics Concern  . Not on file  Social History Narrative  . Not on file   Past Surgical History:  Procedure Laterality Date  . ABDOMINAL HYSTERECTOMY    . CHOLECYSTECTOMY    . TONSILLECTOMY     Past Medical History:  Diagnosis Date  . Anxiety   . Chronic diastolic heart  failure (HCC)   . Chronic pain    hips back and shoulders  . COPD (chronic obstructive pulmonary disease) (HCC)   . Depression   . Hypothyroidism 09/08/2015  . Tachycardia   . Thyroid disease    There were no vitals taken for this visit.  Opioid Risk Score:   Fall Risk Score:  `1  Depression screen PHQ 2/9  [facility] 2/9 02/03/2017 12/02/2016 08/20/2016 09/21/2015  Decreased Interest 1 0 1 0  Down, Depressed, Hopeless 1 0 0 0  PHQ - 2 Score 2 0 1 0  Altered sleeping - - 0 -  Tired, decreased energy - - 0 -  Change in appetite - - 0 -    Feeling bad or failure about yourself  - - 0 -  Trouble concentrating - - 0 -  Moving slowly or fidgety/restless - - 0 -  Suicidal thoughts - - 0 -  PHQ-9 Score - - 1 -  Difficult doing work/chores - - Somewhat difficult -     Review of Systems  Constitutional: Negative.   HENT: Negative.   Eyes: Negative.   Respiratory: Positive for shortness of breath.   Cardiovascular: Negative.   Gastrointestinal: Positive for constipation and diarrhea.  Endocrine: Negative.   Genitourinary: Negative.   Musculoskeletal: Negative.   Skin: Negative.   Allergic/Immunologic: Negative.   Neurological: Negative.   Hematological: Negative.   Psychiatric/Behavioral: Positive for dysphoric mood. The patient is nervous/anxious.   All other systems reviewed and are negative.      Objective:   Physical Exam  Constitutional: She is oriented to person, place, and time. She appears well-developed and well-nourished.  HENT:  Head: Normocephalic and atraumatic.  Neck: Normal range of motion. Neck supple.  Cardiovascular: Normal rate and regular rhythm.  Pulmonary/Chest: Effort normal and breath sounds normal.  Musculoskeletal:  Normal Muscle Bulk and Muscle Testing Reveals:  Upper Extremities: Full ROM and Muscle Strength 4/5 Lumbar Paraspinal Tenderness: L-4-L-5 Lower Extremities: Full ROM and Muscle Strength 5/5 Arises from Table with ease Narrow Based Gait  Neurological: She is alert and oriented to person, place, and time.  Skin: Skin is warm and dry.  Psychiatric: She has a normal mood and affect.  Nursing note and vitals reviewed.         Assessment & Plan:  1. 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 04/30/2017 2. OA of Left Knee: Refilled Oxycodone 10/325 mg one tablet every 8 hours as needed #90. 04/30/2017 We will continue the opioid monitoring program, this consists of regular clinic visits, examinations, urine  drug screen, pill counts as well as use of [place]. Continue Voltaren Gel and HEP as tolerated. 3. Muscle Spasms: Continue Flexeril as needed. 04/30/2017 4. Left Greater Trochanter Bursitis: Continue to Alternate with  Heat and Ice Therapy. Continue to Monitor. 04/30/2017.  20 minutes of face to face patient care time was spent during this visit. All questions were encouraged and answered.  F/U in 1 month

## 2017-05-14 DIAGNOSIS — M5441 Lumbago with sciatica, right side: Secondary | ICD-10-CM | POA: Diagnosis not present

## 2017-05-14 DIAGNOSIS — M545 Low back pain: Secondary | ICD-10-CM | POA: Diagnosis not present

## 2017-05-19 DIAGNOSIS — I5040 Unspecified combined systolic (congestive) and diastolic (congestive) heart failure: Secondary | ICD-10-CM | POA: Diagnosis not present

## 2017-05-19 DIAGNOSIS — J449 Chronic obstructive pulmonary disease, unspecified: Secondary | ICD-10-CM | POA: Diagnosis not present

## 2017-06-02 ENCOUNTER — Encounter: Payer: Self-pay | Admitting: Registered Nurse

## 2017-06-02 ENCOUNTER — Encounter: Payer: Medicare HMO | Attending: Physical Medicine & Rehabilitation | Admitting: Registered Nurse

## 2017-06-02 VITALS — BP 104/62 | HR 76

## 2017-06-02 DIAGNOSIS — G894 Chronic pain syndrome: Secondary | ICD-10-CM | POA: Diagnosis not present

## 2017-06-02 DIAGNOSIS — Z79899 Other long term (current) drug therapy: Secondary | ICD-10-CM | POA: Diagnosis not present

## 2017-06-02 DIAGNOSIS — J449 Chronic obstructive pulmonary disease, unspecified: Secondary | ICD-10-CM | POA: Diagnosis not present

## 2017-06-02 DIAGNOSIS — M1712 Unilateral primary osteoarthritis, left knee: Secondary | ICD-10-CM | POA: Diagnosis not present

## 2017-06-02 DIAGNOSIS — M47816 Spondylosis without myelopathy or radiculopathy, lumbar region: Secondary | ICD-10-CM

## 2017-06-02 DIAGNOSIS — M7062 Trochanteric bursitis, left hip: Secondary | ICD-10-CM

## 2017-06-02 DIAGNOSIS — M5136 Other intervertebral disc degeneration, lumbar region: Secondary | ICD-10-CM | POA: Diagnosis not present

## 2017-06-02 DIAGNOSIS — F419 Anxiety disorder, unspecified: Secondary | ICD-10-CM | POA: Diagnosis not present

## 2017-06-02 DIAGNOSIS — R000 Tachycardia, unspecified: Secondary | ICD-10-CM | POA: Diagnosis not present

## 2017-06-02 DIAGNOSIS — G8929 Other chronic pain: Secondary | ICD-10-CM | POA: Diagnosis not present

## 2017-06-02 DIAGNOSIS — F329 Major depressive disorder, single episode, unspecified: Secondary | ICD-10-CM | POA: Diagnosis not present

## 2017-06-02 DIAGNOSIS — F17210 Nicotine dependence, cigarettes, uncomplicated: Secondary | ICD-10-CM | POA: Insufficient documentation

## 2017-06-02 DIAGNOSIS — E039 Hypothyroidism, unspecified: Secondary | ICD-10-CM | POA: Diagnosis not present

## 2017-06-02 DIAGNOSIS — M545 Low back pain: Secondary | ICD-10-CM | POA: Insufficient documentation

## 2017-06-02 DIAGNOSIS — I5032 Chronic diastolic (congestive) heart failure: Secondary | ICD-10-CM | POA: Diagnosis not present

## 2017-06-02 DIAGNOSIS — Z5181 Encounter for therapeutic drug level monitoring: Secondary | ICD-10-CM | POA: Diagnosis not present

## 2017-06-02 MED ORDER — OXYCODONE-ACETAMINOPHEN 10-325 MG PO TABS: 1.0000 | ORAL_TABLET | Freq: Three times a day (TID) | ORAL | 0 refills | Status: DC | PRN

## 2017-06-02 NOTE — Progress Notes (Signed)
Subjective:    Patient ID: Ashley Houston, female    DOB: 09/25/1940, 77 y.o.   MRN: [id number]  HPI: Ashley Houston is a 77 year old female who returns for follow up appointment for chronic pain and medication refill. She states her pain is usually  located in her lower back radiating into her left hip also reports left knee pain. Stated she had her analgesic this morning and at this time she is pain free.  She rated her pain after her analgesic 0. Her current exercise regime is performing stretching exercises and walking  Ms. Uncapher arrived to office with oxygen desaturation, O2  saturation was rechecked 92%.   This month we continue to discuss smoking cessation, she verbalizes understanding.  [name] Morphine equivalent is  46.50  MME. She is also prescribed Lorazepam by Dr. Yetta BarreJones PCP/ Lianne MorisErin Carroll PA..We have reviewed the black box warning again regarding  using opioids and benzodiazepines. I highlighted the dangers of using these drugs together and discussed the adverse events including respiratory suppression, overdose, cognitive impairment and importance of  compliance with current regimen. Sheverbalizes understanding, we will continue to monitor and adjust as indicated.    Oral Swab Performed on 12/02/2016 it was consistent. Oral Swab Performed Today.  Pain Inventory  Average Pain 10 Pain Right Now 0 My pain is dull and aching  In the last 24 hours, has pain interfered with the following? General activity 7 Relation with others 5 Enjoyment of life 7 What TIME of day is your pain at its worst? all Sleep (in general) Good  Pain is worse with: walking, bending, sitting, standing and some activites Pain improves with: rest, heat/ice, therapy/exercise and medication Relief from Meds: 10  Mobility walk without assistance ability to climb steps?  no  Function retired  Neuro/Psych bladder control problems bowel control  problems weakness spasms depression anxiety  Prior Studies Any changes since last visit?  no  Physicians involved in your care Any changes since last visit?  no   No family history on file. Social History   Socioeconomic History  . Marital status: Widowed    Spouse name: Not on file  . Number of children: Not on file  . Years of education: Not on file  . Highest education level: Not on file  Social Needs  . Financial resource strain: Not on file  . Food insecurity - worry: Not on file  . Food insecurity - inability: Not on file  . Transportation needs - medical: Not on file  . Transportation needs - non-medical: Not on file  Occupational History  . Not on file  Tobacco Use  . Smoking status: Current Every Day Smoker    Packs/day: 0.50    Types: Cigarettes  . Smokeless tobacco: Never Used  Substance and Sexual Activity  . Alcohol use: No  . Drug use: No  . Sexual activity: Not on file  Other Topics Concern  . Not on file  Social History Narrative  . Not on file   Past Surgical History:  Procedure Laterality Date  . ABDOMINAL HYSTERECTOMY    . CHOLECYSTECTOMY    . TONSILLECTOMY     Past Medical History:  Diagnosis Date  . Anxiety   . Chronic diastolic heart failure (HCC)   . Chronic pain    hips back and shoulders  . COPD (chronic obstructive pulmonary disease) (HCC)   . Depression   . Hypothyroidism 09/08/2015  . Tachycardia   . Thyroid disease  There were no vitals taken for this visit.  Opioid Risk Score:   Fall Risk Score:  `1  Depression screen PHQ 2/9  Depression screen [facility] 2/9 02/03/2017 12/02/2016 08/20/2016 09/21/2015  Decreased Interest 1 0 1 0  Down, Depressed, Hopeless 1 0 0 0  PHQ - 2 Score 2 0 1 0  Altered sleeping - - 0 -  Tired, decreased energy - - 0 -  Change in appetite - - 0 -  Feeling bad or failure about yourself  - - 0 -  Trouble concentrating - - 0 -  Moving slowly or fidgety/restless - - 0 -  Suicidal thoughts - - 0 -   PHQ-9 Score - - 1 -  Difficult doing work/chores - - Somewhat difficult -     Review of Systems  Constitutional: Negative.   HENT: Negative.   Eyes: Negative.   Respiratory: Positive for shortness of breath.   Cardiovascular: Negative.   Gastrointestinal: Positive for constipation and diarrhea.  Endocrine: Negative.   Genitourinary: Negative.   Musculoskeletal: Negative.   Skin: Negative.   Allergic/Immunologic: Negative.   Neurological: Negative.   Hematological: Negative.   Psychiatric/Behavioral: Positive for dysphoric mood. The patient is nervous/anxious.   All other systems reviewed and are negative.      Objective:   Physical Exam  Constitutional: She is oriented to person, place, and time. She appears well-developed and well-nourished.  HENT:  Head: Normocephalic and atraumatic.  Neck: Normal range of motion. Neck supple.  Cardiovascular: Normal rate and regular rhythm.  Pulmonary/Chest: Effort normal and breath sounds normal.  Musculoskeletal:  Normal Muscle Bulk and Muscle Testing Reveals: Upper Extremities: Full ROM and Muscle Strength 4/5 Back without spinal tenderness noted Lower Extremities: Full ROM and Muscle Strength 5/5 Arises from Table with ease Narrow Based Gait  Neurological: She is alert and oriented to person, place, and time.  Skin: Skin is warm and dry.  Psychiatric: She has a normal mood and affect.  Nursing note and vitals reviewed.         Assessment & Plan:  1. 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 06/02/2017 2. OA of Left Knee: Refilled Oxycodone 10/325 mg one tablet every 8 hours as needed #90. 06/02/2017 We will continue the opioid monitoring program, this consists of regular clinic visits, examinations, urine drug screen, pill counts as well as use of [place] Controlled Substance reporting System. Continue Voltaren Gel and HEP as tolerated. 3. Muscle  Spasms: Continue Flexeril as needed. 06/02/2017 4. Left Greater Trochanter Bursitis: Continue to Alternate with  Heat and Ice Therapy. Continue to Monitor. 06/02/2017.  20 minutes of face to face patient care time was spent during this visit. All questions were encouraged and answered.  F/U in 1 month

## 2017-06-07 LAB — DRUG TOX MONITOR 1 W/CONF, ORAL FLD
Alprazolam: NEGATIVE ng/mL (ref <0.50)
Amphetamines: NEGATIVE ng/mL (ref <10)
Barbiturates: NEGATIVE ng/mL (ref <10)
Benzodiazepines: POSITIVE ng/mL — AB (ref <0.50)
Buprenorphine: NEGATIVE ng/mL (ref <0.10)
CHLORDIAZEPOXIDE: NEGATIVE ng/mL (ref <0.50)
CLONAZEPAM: NEGATIVE ng/mL (ref <0.50)
Cocaine: NEGATIVE ng/mL (ref <5.0)
Codeine: NEGATIVE ng/mL (ref <2.5)
Cotinine: >250 ng/mL — ABNORMAL HIGH (ref <5.0)
DIAZEPAM: NEGATIVE ng/mL (ref <0.50)
DIHYDROCODEINE: NEGATIVE ng/mL (ref <2.5)
FENTANYL: NEGATIVE ng/mL (ref <0.10)
FLUNITRAZEPAM: NEGATIVE ng/mL (ref <0.50)
FLURAZEPAM: NEGATIVE ng/mL (ref <0.50)
HYDROMORPHONE: NEGATIVE ng/mL (ref <2.5)
Heroin Metabolite: NEGATIVE ng/mL (ref <1.0)
Hydrocodone: NEGATIVE ng/mL (ref <2.5)
Lorazepam: 7.94 ng/mL — ABNORMAL HIGH (ref <0.50)
MARIJUANA: NEGATIVE ng/mL (ref <2.5)
MDMA: NEGATIVE ng/mL (ref <10)
METHADONE: NEGATIVE ng/mL (ref <5.0)
MIDAZOLAM: NEGATIVE ng/mL (ref <0.50)
MORPHINE: NEGATIVE ng/mL (ref <2.5)
Meprobamate: NEGATIVE ng/mL (ref <2.5)
NICOTINE METABOLITE: POSITIVE ng/mL — AB (ref <5.0)
NORHYDROCODONE: NEGATIVE ng/mL (ref <2.5)
NOROXYCODONE: 238.2 ng/mL — AB (ref <2.5)
Nordiazepam: NEGATIVE ng/mL (ref <0.50)
OXAZEPAM: NEGATIVE ng/mL (ref <0.50)
OXYMORPHONE: 4.3 ng/mL — AB (ref <2.5)
Opiates: POSITIVE ng/mL — AB (ref <2.5)
Phencyclidine: NEGATIVE ng/mL (ref <10)
TEMAZEPAM: NEGATIVE ng/mL (ref <0.50)
Tapentadol: NEGATIVE ng/mL (ref <5.0)
Tramadol: NEGATIVE ng/mL (ref <5.0)
Triazolam: NEGATIVE ng/mL (ref <0.50)
Zolpidem: NEGATIVE ng/mL (ref <5.0)

## 2017-06-07 LAB — DRUG TOX ALC METAB W/CON, ORAL FLD: Alcohol Metabolite: NEGATIVE ng/mL (ref <25)

## 2017-06-08 DIAGNOSIS — Z6821 Body mass index (BMI) 21.0-21.9, adult: Secondary | ICD-10-CM | POA: Diagnosis not present

## 2017-06-08 DIAGNOSIS — J209 Acute bronchitis, unspecified: Secondary | ICD-10-CM | POA: Diagnosis not present

## 2017-06-08 DIAGNOSIS — D649 Anemia, unspecified: Secondary | ICD-10-CM | POA: Diagnosis not present

## 2017-06-09 ENCOUNTER — Telehealth: Payer: Self-pay | Admitting: *Deleted

## 2017-06-09 NOTE — Telephone Encounter (Signed)
Oral swab drug screen was consistent for prescribed medications.  ?

## 2017-06-19 DIAGNOSIS — J449 Chronic obstructive pulmonary disease, unspecified: Secondary | ICD-10-CM | POA: Diagnosis not present

## 2017-06-19 DIAGNOSIS — I5040 Unspecified combined systolic (congestive) and diastolic (congestive) heart failure: Secondary | ICD-10-CM | POA: Diagnosis not present

## 2017-07-03 ENCOUNTER — Encounter: Payer: Self-pay | Admitting: Registered Nurse

## 2017-07-03 ENCOUNTER — Encounter: Payer: Medicare HMO | Attending: Physical Medicine & Rehabilitation | Admitting: Registered Nurse

## 2017-07-03 VITALS — BP 110/63 | HR 75 | Resp 14

## 2017-07-03 DIAGNOSIS — F17210 Nicotine dependence, cigarettes, uncomplicated: Secondary | ICD-10-CM | POA: Insufficient documentation

## 2017-07-03 DIAGNOSIS — G8929 Other chronic pain: Secondary | ICD-10-CM | POA: Diagnosis not present

## 2017-07-03 DIAGNOSIS — R000 Tachycardia, unspecified: Secondary | ICD-10-CM | POA: Diagnosis not present

## 2017-07-03 DIAGNOSIS — M7062 Trochanteric bursitis, left hip: Secondary | ICD-10-CM | POA: Diagnosis not present

## 2017-07-03 DIAGNOSIS — M1712 Unilateral primary osteoarthritis, left knee: Secondary | ICD-10-CM | POA: Diagnosis not present

## 2017-07-03 DIAGNOSIS — Z79899 Other long term (current) drug therapy: Secondary | ICD-10-CM | POA: Diagnosis not present

## 2017-07-03 DIAGNOSIS — M545 Low back pain: Secondary | ICD-10-CM | POA: Insufficient documentation

## 2017-07-03 DIAGNOSIS — F419 Anxiety disorder, unspecified: Secondary | ICD-10-CM | POA: Diagnosis not present

## 2017-07-03 DIAGNOSIS — J449 Chronic obstructive pulmonary disease, unspecified: Secondary | ICD-10-CM | POA: Insufficient documentation

## 2017-07-03 DIAGNOSIS — M47816 Spondylosis without myelopathy or radiculopathy, lumbar region: Secondary | ICD-10-CM

## 2017-07-03 DIAGNOSIS — M5136 Other intervertebral disc degeneration, lumbar region: Secondary | ICD-10-CM | POA: Diagnosis not present

## 2017-07-03 DIAGNOSIS — E039 Hypothyroidism, unspecified: Secondary | ICD-10-CM | POA: Diagnosis not present

## 2017-07-03 DIAGNOSIS — F329 Major depressive disorder, single episode, unspecified: Secondary | ICD-10-CM | POA: Insufficient documentation

## 2017-07-03 DIAGNOSIS — G894 Chronic pain syndrome: Secondary | ICD-10-CM

## 2017-07-03 DIAGNOSIS — Z5181 Encounter for therapeutic drug level monitoring: Secondary | ICD-10-CM

## 2017-07-03 DIAGNOSIS — I5032 Chronic diastolic (congestive) heart failure: Secondary | ICD-10-CM | POA: Insufficient documentation

## 2017-07-03 DIAGNOSIS — M51369 Other intervertebral disc degeneration, lumbar region without mention of lumbar back pain or lower extremity pain: Secondary | ICD-10-CM

## 2017-07-03 MED ORDER — OXYCODONE-ACETAMINOPHEN 10-325 MG PO TABS: 1.0000 | ORAL_TABLET | Freq: Three times a day (TID) | ORAL | 0 refills | Status: DC | PRN

## 2017-07-03 NOTE — Progress Notes (Signed)
Subjective:    Patient ID: Ashley Houston, female    DOB: 11/16/1940, 77 y.o.   MRN: [id number]  HPI: Ashley Houston is a 77 year old female who returns for follow up appointment for chronic pain and medication refill. She states her pain is located in her lower back radiating into her left hip. She rates her pain 0. She reports taking her oxycodone this morning and at this time she is pain free. Her current exercise regime is performing stretching exercises and walking  Ms. Stawicki Morphine equivalent is 46.50 MME. She is also prescribed Lorazepam by Dr. Yetta BarreJones PCP/ Lianne MorisErin Carroll PA..We have reviewed the black box warning again regarding  using opioids and benzodiazepines. I highlighted the dangers of using these drugs together and discussed the adverse events including respiratory suppression, overdose, cognitive impairment and importance of  compliance with current regimen. Sheverbalizes understanding, we will continue to monitor and adjust as indicated.    Oral Swab was Performed on 06/02/2017 it was consistent.   Pain Inventory  Average Pain 10 Pain Right Now 0 My pain is dull and aching  In the last 24 hours, has pain interfered with the following? General activity 5 Relation with others 5 Enjoyment of life 7 What TIME of day is your pain at its worst? all Sleep (in general) Fair  Pain is worse with: walking, bending, sitting and standing Pain improves with: rest, heat/ice, therapy/exercise and medication Relief from Meds: 10  Mobility walk without assistance ability to climb steps?  no needs help with transfers  Function retired I need assistance with the following:  meal prep, household duties and shopping  Neuro/Psych bowel control problems spasms depression anxiety  Prior Studies Any changes since last visit?  no  Physicians involved in your care Any changes since last visit?  no   History reviewed. No pertinent family history. Social History    Socioeconomic History  . Marital status: Widowed    Spouse name: None  . Number of children: None  . Years of education: None  . Highest education level: None  Social Needs  . Financial resource strain: None  . Food insecurity - worry: None  . Food insecurity - inability: None  . Transportation needs - medical: None  . Transportation needs - non-medical: None  Occupational History  . None  Tobacco Use  . Smoking status: Current Every Day Smoker    Packs/day: 0.50    Types: Cigarettes  . Smokeless tobacco: Never Used  Substance and Sexual Activity  . Alcohol use: No  . Drug use: No  . Sexual activity: None  Other Topics Concern  . None  Social History Narrative  . None   Past Surgical History:  Procedure Laterality Date  . ABDOMINAL HYSTERECTOMY    . CHOLECYSTECTOMY    . TONSILLECTOMY     Past Medical History:  Diagnosis Date  . Anxiety   . Chronic diastolic heart failure (HCC)   . Chronic pain    hips back and shoulders  . COPD (chronic obstructive pulmonary disease) (HCC)   . Depression   . Hypothyroidism 09/08/2015  . Tachycardia   . Thyroid disease    BP 110/63   Pulse 75   Resp 14   SpO2 90%   Opioid Risk Score:   Fall Risk Score:  `1  Depression screen PHQ 2/9  [facility] 02/03/2017 12/02/2016 08/20/2016 09/21/2015  Decreased Interest 1 0 1 0  Down, Depressed, Hopeless 1 0 0 0  PHQ - 2 Score 2 0 1 0  Altered sleeping - - 0 -  Tired, decreased energy - - 0 -  Change in appetite - - 0 -  Feeling bad or failure about yourself  - - 0 -  Trouble concentrating - - 0 -  Moving slowly or fidgety/restless - - 0 -  Suicidal thoughts - - 0 -  PHQ-9 Score - - 1 -  Difficult doing work/chores - - Somewhat difficult -     Review of Systems  Constitutional: Negative.   HENT: Negative.   Eyes: Negative.   Respiratory: Positive for shortness of breath.   Cardiovascular: Negative.   Gastrointestinal: Positive for constipation and  diarrhea.  Endocrine: Negative.   Genitourinary: Negative.   Musculoskeletal: Positive for arthralgias and back pain.  Skin: Negative.   Allergic/Immunologic: Negative.   Hematological: Negative.   Psychiatric/Behavioral: Positive for dysphoric mood. The patient is nervous/anxious.   All other systems reviewed and are negative.      Objective:   Physical Exam  Constitutional: She is oriented to person, place, and time. She appears well-developed and well-nourished.  HENT:  Head: Normocephalic and atraumatic.  Neck: Normal range of motion. Neck supple.  Cardiovascular: Normal rate and regular rhythm.  Pulmonary/Chest: Effort normal and breath sounds normal.  Musculoskeletal:  Normal Muscle Bulk and Muscle Testing Reveals: Upper Extremities: Full ROM and Muscle Strength 5/5 Lumbar Paraspinal Tenderness: L-3-L-5 Lower Extremities: Full ROM and Muscle Strength 5/5 Arises from Table with ease Narrow Based Gait  Neurological: She is alert and oriented to person, place, and time.  Skin: Skin is warm and dry.  Psychiatric: She has a normal mood and affect.  Nursing note and vitals reviewed.         Assessment & Plan:  1. 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 07/03/2017 2. OA of Left Knee: Refilled Oxycodone 10/325 mg one tablet every 8 hours as needed #90. 00/22/2019 We will continue the opioid monitoring program, this consists of regular clinic visits, examinations, urine drug screen, pill counts as well as use of [place] Controlled Substance reporting System. Continue Voltaren Gel and HEP as tolerated. 3. Muscle Spasms: Continue Flexeril as needed. 00/22/2019 4. Left Greater Trochanter Bursitis: Continue to Alternate with  Heat and Ice Therapy. Continue to Monitor. 00/22/2019.  20 minutes of face to face patient care time was spent during this visit. All questions were encouraged and answered.  F/U  in 1 month

## 2017-07-10 DIAGNOSIS — Z6821 Body mass index (BMI) 21.0-21.9, adult: Secondary | ICD-10-CM | POA: Diagnosis not present

## 2017-07-10 DIAGNOSIS — M25562 Pain in left knee: Secondary | ICD-10-CM | POA: Diagnosis not present

## 2017-07-10 DIAGNOSIS — M545 Low back pain: Secondary | ICD-10-CM | POA: Diagnosis not present

## 2017-07-10 DIAGNOSIS — R109 Unspecified abdominal pain: Secondary | ICD-10-CM | POA: Diagnosis not present

## 2017-07-10 DIAGNOSIS — E782 Mixed hyperlipidemia: Secondary | ICD-10-CM | POA: Diagnosis not present

## 2017-07-10 DIAGNOSIS — I10 Essential (primary) hypertension: Secondary | ICD-10-CM | POA: Diagnosis not present

## 2017-07-10 DIAGNOSIS — F411 Generalized anxiety disorder: Secondary | ICD-10-CM | POA: Diagnosis not present

## 2017-07-10 DIAGNOSIS — Z1389 Encounter for screening for other disorder: Secondary | ICD-10-CM | POA: Diagnosis not present

## 2017-07-17 DIAGNOSIS — J449 Chronic obstructive pulmonary disease, unspecified: Secondary | ICD-10-CM | POA: Diagnosis not present

## 2017-07-17 DIAGNOSIS — I5040 Unspecified combined systolic (congestive) and diastolic (congestive) heart failure: Secondary | ICD-10-CM | POA: Diagnosis not present

## 2017-07-31 ENCOUNTER — Encounter: Payer: Self-pay | Admitting: Registered Nurse

## 2017-07-31 ENCOUNTER — Encounter: Payer: Medicare HMO | Attending: Physical Medicine & Rehabilitation | Admitting: Registered Nurse

## 2017-07-31 VITALS — BP 125/66 | HR 76

## 2017-07-31 DIAGNOSIS — M1712 Unilateral primary osteoarthritis, left knee: Secondary | ICD-10-CM

## 2017-07-31 DIAGNOSIS — M545 Low back pain: Secondary | ICD-10-CM | POA: Insufficient documentation

## 2017-07-31 DIAGNOSIS — R000 Tachycardia, unspecified: Secondary | ICD-10-CM | POA: Insufficient documentation

## 2017-07-31 DIAGNOSIS — F419 Anxiety disorder, unspecified: Secondary | ICD-10-CM | POA: Diagnosis not present

## 2017-07-31 DIAGNOSIS — J449 Chronic obstructive pulmonary disease, unspecified: Secondary | ICD-10-CM | POA: Insufficient documentation

## 2017-07-31 DIAGNOSIS — M47816 Spondylosis without myelopathy or radiculopathy, lumbar region: Secondary | ICD-10-CM | POA: Diagnosis not present

## 2017-07-31 DIAGNOSIS — G894 Chronic pain syndrome: Secondary | ICD-10-CM

## 2017-07-31 DIAGNOSIS — E039 Hypothyroidism, unspecified: Secondary | ICD-10-CM | POA: Insufficient documentation

## 2017-07-31 DIAGNOSIS — I5032 Chronic diastolic (congestive) heart failure: Secondary | ICD-10-CM | POA: Insufficient documentation

## 2017-07-31 DIAGNOSIS — M5136 Other intervertebral disc degeneration, lumbar region: Secondary | ICD-10-CM | POA: Diagnosis not present

## 2017-07-31 DIAGNOSIS — Z79899 Other long term (current) drug therapy: Secondary | ICD-10-CM | POA: Diagnosis not present

## 2017-07-31 DIAGNOSIS — F17210 Nicotine dependence, cigarettes, uncomplicated: Secondary | ICD-10-CM | POA: Diagnosis not present

## 2017-07-31 DIAGNOSIS — G8929 Other chronic pain: Secondary | ICD-10-CM | POA: Insufficient documentation

## 2017-07-31 DIAGNOSIS — Z5181 Encounter for therapeutic drug level monitoring: Secondary | ICD-10-CM | POA: Diagnosis not present

## 2017-07-31 DIAGNOSIS — F329 Major depressive disorder, single episode, unspecified: Secondary | ICD-10-CM | POA: Insufficient documentation

## 2017-07-31 DIAGNOSIS — M5416 Radiculopathy, lumbar region: Secondary | ICD-10-CM | POA: Diagnosis not present

## 2017-07-31 DIAGNOSIS — M7062 Trochanteric bursitis, left hip: Secondary | ICD-10-CM | POA: Diagnosis not present

## 2017-07-31 MED ORDER — OXYCODONE-ACETAMINOPHEN 10-325 MG PO TABS: 1.0000 | ORAL_TABLET | Freq: Three times a day (TID) | ORAL | 0 refills | Status: DC | PRN

## 2017-07-31 NOTE — Progress Notes (Signed)
Subjective:    Patient ID: Ashley Houston, female    DOB: March 19, 1941, 77 y.o.   MRN: [id number]  HPI: Ashley Houston is a 77 year old female who returns for follow up appointment for chronic pain and medication refill. She states her pain is located in her lower back radiating into his left hip. She rates her pain 0. Her current exercise regime is walking.  Ashley Houston Morphine equivalent is 49.50 MME. She is also prescribed Lorazepam by Dr. Yetta Barre PCP/ Lianne Moris PA..We have reviewed the black box warning again regarding  using opioids and benzodiazepines. I highlighted the dangers of using these drugs together and discussed the adverse events including respiratory suppression, overdose, cognitive impairment and importance of  compliance with current regimen. Sheverbalizes understanding, we will continue to monitor and adjust as indicated.    Oral Swab was Performed on 06/02/2017 it was consistent.   Pain Inventory  Average Pain 10 Pain Right Now 0 My pain is dull and aching  In the last 24 hours, has pain interfered with the following? General activity 0 Relation with others 0 Enjoyment of life 0 What TIME of day is your pain at its worst? all Sleep (in general) Good  Pain is worse with: walking, bending, sitting and standing Pain improves with: rest, heat/ice, therapy/exercise and medication Relief from Meds: 10  Mobility walk without assistance ability to climb steps?  no needs help with transfers  Function retired I need assistance with the following:  meal prep, household duties and shopping  Neuro/Psych bowel control problems spasms depression anxiety  Prior Studies Any changes since last visit?  no  Physicians involved in your care Any changes since last visit?  no   No family history on file. Social History   Socioeconomic History  . Marital status: Widowed    Spouse name: Not on file  . Number of children: Not on file  . Years of education:  Not on file  . Highest education level: Not on file  Occupational History  . Not on file  Social Needs  . Financial resource strain: Not on file  . Food insecurity:    Worry: Not on file    Inability: Not on file  . Transportation needs:    Medical: Not on file    Non-medical: Not on file  Tobacco Use  . Smoking status: Current Every Day Smoker    Packs/day: 0.50    Types: Cigarettes  . Smokeless tobacco: Never Used  Substance and Sexual Activity  . Alcohol use: No  . Drug use: No  . Sexual activity: Not on file  Lifestyle  . Physical activity:    Days per week: Not on file    Minutes per session: Not on file  . Stress: Not on file  Relationships  . Social connections:    Talks on phone: Not on file    Gets together: Not on file    Attends religious service: Not on file    Active member of club or organization: Not on file    Attends meetings of clubs or organizations: Not on file    Relationship status: Not on file  Other Topics Concern  . Not on file  Social History Narrative  . Not on file   Past Surgical History:  Procedure Laterality Date  . ABDOMINAL HYSTERECTOMY    . CHOLECYSTECTOMY    . TONSILLECTOMY     Past Medical History:  Diagnosis Date  . Anxiety   .  Chronic diastolic heart failure (HCC)   . Chronic pain    hips back and shoulders  . COPD (chronic obstructive pulmonary disease) (HCC)   . Depression   . Hypothyroidism 09/08/2015  . Tachycardia   . Thyroid disease    BP 125/66   Pulse 76   SpO2 90%   Opioid Risk Score:   Fall Risk Score:  `1  Depression screen PHQ 2/9  [facility] 07/31/2017 02/03/2017 12/02/2016 08/20/2016 09/21/2015  Decreased Interest 1 1 0 1 0  Down, Depressed, Hopeless 1 1 0 0 0  PHQ - 2 Score 2 2 0 1 0  Altered sleeping - - - 0 -  Tired, decreased energy - - - 0 -  Change in appetite - - - 0 -  Feeling bad or failure about yourself  - - - 0 -  Trouble concentrating - - - 0 -  Moving slowly or  fidgety/restless - - - 0 -  Suicidal thoughts - - - 0 -  PHQ-9 Score - - - 1 -  Difficult doing work/chores - - - Somewhat difficult -     Review of Systems  Constitutional: Negative.   HENT: Negative.   Eyes: Negative.   Respiratory: Positive for shortness of breath.   Cardiovascular: Negative.   Gastrointestinal: Positive for constipation.  Endocrine: Negative.   Genitourinary: Negative.   Musculoskeletal: Positive for arthralgias and back pain.  Skin: Negative.   Allergic/Immunologic: Negative.   Hematological: Negative.   Psychiatric/Behavioral: Positive for dysphoric mood. The patient is nervous/anxious.   All other systems reviewed and are negative.      Objective:   Physical Exam  Constitutional: She is oriented to person, place, and time. She appears well-developed and well-nourished.  HENT:  Head: Normocephalic and atraumatic.  Neck: Normal range of motion. Neck supple.  Cardiovascular: Normal rate and regular rhythm.  Pulmonary/Chest: Effort normal and breath sounds normal.  Musculoskeletal:  Normal Muscle Bulk and Muscle Testing Reveals: Upper Extremities: Full ROM and Muscle Strength 5/5 Lumbar Paraspinal Tenderness: L-4-L-5 Left Greater Trochanter Tenderness Lower Extremities: Full ROM and Muscle Strength 5/5 Arises from Table with ease Narrow Based Gait  Neurological: She is alert and oriented to person, place, and time.  Skin: Skin is warm and dry.  Psychiatric: She has a normal mood and affect.  Nursing note and vitals reviewed.         Assessment & Plan:  1. 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 07/31/2017 2. OA of Left Knee: Refilled Oxycodone 10/325 mg one tablet every 8 hours as needed #90. 07/31/2017 We will continue the opioid monitoring program, this consists of regular clinic visits, examinations, urine drug screen, pill counts as well as use of [place]  Controlled [place]. Continue Voltaren Gel and HEP as tolerated. 3. Muscle Spasms: Continue Flexeril as needed. 07/31/2017 4. Left Greater Trochanter Bursitis: Continue to Alternate with  Heat and Ice Therapy. Continue to Monitor. 07/31/2017.  20 minutes of face to face patient care time was spent during this visit. All questions were encouraged and answered.  F/U in 1 month

## 2017-08-17 DIAGNOSIS — J449 Chronic obstructive pulmonary disease, unspecified: Secondary | ICD-10-CM | POA: Diagnosis not present

## 2017-08-17 DIAGNOSIS — I5040 Unspecified combined systolic (congestive) and diastolic (congestive) heart failure: Secondary | ICD-10-CM | POA: Diagnosis not present

## 2017-08-26 ENCOUNTER — Encounter: Payer: Medicare HMO | Attending: Physical Medicine & Rehabilitation | Admitting: Physical Medicine & Rehabilitation

## 2017-08-26 ENCOUNTER — Encounter: Payer: Self-pay | Admitting: Physical Medicine & Rehabilitation

## 2017-08-26 ENCOUNTER — Other Ambulatory Visit: Payer: Self-pay

## 2017-08-26 DIAGNOSIS — M7062 Trochanteric bursitis, left hip: Secondary | ICD-10-CM | POA: Diagnosis not present

## 2017-08-26 DIAGNOSIS — J449 Chronic obstructive pulmonary disease, unspecified: Secondary | ICD-10-CM | POA: Insufficient documentation

## 2017-08-26 DIAGNOSIS — E039 Hypothyroidism, unspecified: Secondary | ICD-10-CM | POA: Diagnosis not present

## 2017-08-26 DIAGNOSIS — F17210 Nicotine dependence, cigarettes, uncomplicated: Secondary | ICD-10-CM | POA: Insufficient documentation

## 2017-08-26 DIAGNOSIS — I5032 Chronic diastolic (congestive) heart failure: Secondary | ICD-10-CM | POA: Insufficient documentation

## 2017-08-26 DIAGNOSIS — M1712 Unilateral primary osteoarthritis, left knee: Secondary | ICD-10-CM | POA: Diagnosis not present

## 2017-08-26 DIAGNOSIS — M47816 Spondylosis without myelopathy or radiculopathy, lumbar region: Secondary | ICD-10-CM | POA: Diagnosis not present

## 2017-08-26 DIAGNOSIS — G8929 Other chronic pain: Secondary | ICD-10-CM | POA: Insufficient documentation

## 2017-08-26 DIAGNOSIS — M545 Low back pain: Secondary | ICD-10-CM | POA: Diagnosis not present

## 2017-08-26 DIAGNOSIS — F329 Major depressive disorder, single episode, unspecified: Secondary | ICD-10-CM | POA: Diagnosis not present

## 2017-08-26 DIAGNOSIS — F419 Anxiety disorder, unspecified: Secondary | ICD-10-CM | POA: Insufficient documentation

## 2017-08-26 DIAGNOSIS — R000 Tachycardia, unspecified: Secondary | ICD-10-CM | POA: Insufficient documentation

## 2017-08-26 MED ORDER — MELOXICAM 15 MG PO TABS: 15.0000 mg | ORAL_TABLET | Freq: Every day | ORAL | 5 refills | Status: DC | PRN

## 2017-08-26 MED ORDER — OXYCODONE-ACETAMINOPHEN 10-325 MG PO TABS: 1.0000 | ORAL_TABLET | Freq: Three times a day (TID) | ORAL | 0 refills | Status: DC | PRN

## 2017-08-26 NOTE — Progress Notes (Signed)
Subjective:    Patient ID: Ashley Houston, female    DOB: August 05, 1940, 77 y.o.   MRN: [id number]  HPI   Mrs Krebbs is here in follow up of her chronic back pain. She states her avg pain is around an 8. But she states her pain is reasonably controlled with her HEP, voltaren gel, meloxicam and percocet. She is still taking meloxicam daily. Denies any SE with medication. She demonstrated some of the routine she follows most days from a ROM/stretching standpoint.   Her back pain generally stays central with some radiation to the upper left buttock. Left pain is under fair control but typically bothers her more with extended weight bearing activities.   Pain Inventory Average Pain 10 Pain Right Now 0 My pain is sharp, dull and aching  In the last 24 hours, has pain interfered with the following? General activity 0 Relation with others 7 Enjoyment of life 7 What TIME of day is your pain at its worst? mornign daytime evening  Sleep (in general) Good  Pain is worse with: walking, bending, sitting and standing Pain improves with: rest, heat/ice, therapy/exercise and medication Relief from Meds: 10  Mobility do you drive?  yes needs help with transfers  Function I need assistance with the following:  meal prep, household duties and shopping  Neuro/Psych spasms depression anxiety  Prior Studies Any changes since last visit?  no  Physicians involved in your care Primary care Dr. Arnette Felts   No family history on file. Social History   Socioeconomic History  . Marital status: Widowed    Spouse name: Not on file  . Number of children: Not on file  . Years of education: Not on file  . Highest education level: Not on file  Occupational History  . Not on file  Social Needs  . Financial resource strain: Not on file  . Food insecurity:    Worry: Not on file    Inability: Not on file  . Transportation needs:    Medical: Not on file    Non-medical: Not on file  Tobacco  Use  . Smoking status: Current Every Day Smoker    Packs/day: 0.50    Types: Cigarettes  . Smokeless tobacco: Never Used  Substance and Sexual Activity  . Alcohol use: No  . Drug use: No  . Sexual activity: Not on file  Lifestyle  . Physical activity:    Days per week: Not on file    Minutes per session: Not on file  . Stress: Not on file  Relationships  . Social connections:    Talks on phone: Not on file    Gets together: Not on file    Attends religious service: Not on file    Active member of club or organization: Not on file    Attends meetings of clubs or organizations: Not on file    Relationship status: Not on file  Other Topics Concern  . Not on file  Social History Narrative  . Not on file   Past Surgical History:  Procedure Laterality Date  . ABDOMINAL HYSTERECTOMY    . CHOLECYSTECTOMY    . TONSILLECTOMY     Past Medical History:  Diagnosis Date  . Anxiety   . Chronic diastolic heart failure (HCC)   . Chronic pain    hips back and shoulders  . COPD (chronic obstructive pulmonary disease) (HCC)   . Depression   . Hypothyroidism 09/08/2015  . Tachycardia   . Thyroid disease  BP 104/62   Pulse 71   Ht 5\' 6"  (1.676 m) Comment: pt reported  Wt 128 lb 12.8 oz (58.4 kg)   SpO2 (!) 89%   BMI 20.79 kg/m   Opioid Risk Score:   Fall Risk Score:  `1  Depression screen PHQ 2/9  Depression screen [facility] 2/9 08/26/2017 07/31/2017 02/03/2017 12/02/2016 08/20/2016 09/21/2015  Decreased Interest 0 1 1 0 1 0  Down, Depressed, Hopeless 0 1 1 0 0 0  PHQ - 2 Score 0 2 2 0 1 0  Altered sleeping 0 - - - 0 -  Tired, decreased energy 0 - - - 0 -  Change in appetite 0 - - - 0 -  Feeling bad or failure about yourself  0 - - - 0 -  Trouble concentrating 0 - - - 0 -  Moving slowly or fidgety/restless 0 - - - 0 -  Suicidal thoughts 0 - - - 0 -  PHQ-9 Score 0 - - - 1 -  Difficult doing work/chores Not difficult at all - - - Somewhat difficult -    Review of Systems    Constitutional: Negative.   HENT: Negative.   Eyes: Negative.   Respiratory: Positive for shortness of breath.   Cardiovascular: Negative.   Gastrointestinal: Positive for constipation.  Endocrine: Negative.   Genitourinary: Negative.   Musculoskeletal: Negative.   Skin: Negative.   Allergic/Immunologic: Negative.   Neurological: Negative.   Hematological: Negative.   Psychiatric/Behavioral: Negative.   All other systems reviewed and are negative.      Objective:   Physical Exam  General: No acute distress HEENT: EOMI, oral membranes moist Cards: reg rate  Chest: normal effort Abdomen: Soft, NT, ND Skin: dry, intact Extremities: no edema Skin:Clean and intact without signs of breakdown Neuro:Pt is cognitively appropriate with normal insight, memory, and awareness. Cranial nerves 2-12 are intact. Sensory exam is normal. Reflexes are 2+ in all 4's. Fine motor coordination is intact. No tremors. Motor function is grossly 5/5.  Musculoskeletal:80 degreesflexion  but still favors left slightly. Extension 20 degrees. . Extension and facet maneuvers cause some pain. left knee with minimal pain during ROM today.  Psych:Pt's affect is appropriate. Pt is cooperative       Assessment & Plan:  1. 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  2. Left greater trochanter bursitis 3. OA left knee, ?meniscal injury 4. Tobacco abuse   Plan:  1. Continuevoltaren gel for left knee and left hip--which has been beneficial to her back and knee.   -NO refill needed today  -asked her to reduce meloxicam to qd prn 2. Continueflexeril for back spasms, 10mg  q8 prn. #60.  -continue heating pad.   4. Ice to hips shall continue  5. Continuepercocet 10/325, one q8 prn, #90. We will continue the controlled substance monitoring program, this  consists of regular clinic visits, examinations, routine drug screening, pill counts as well as use of [place] Controlled Sub[place] Reporting [place]. [place].     6. Maintain HEP. Reviewed importance of involvement of LE's in HEP. 7. Considering appearance of most recent MRI, an ESI might be appropriate. However, she is stabile from a pain and exam standpoint---will continue to monitor for need. 8. Smoking cessation was not reviewed today  Fifteen minutes of face to face patient care time were spent during this visit. All questions were encouraged  and answered. Greater than 50% of time during this encounter was spent counseling patient/family in regard to HEP, review of anatomy and pathophysiology.   Follow up in a month with NP

## 2017-08-26 NOTE — Patient Instructions (Addendum)
PLEASE FEEL FREE TO CALL OUR OFFICE WITH ANY PROBLEMS OR QUESTIONS [phone])  KEEP WORK ON YOUR RANGE OF MOTION AND POSTURE. PUSH YOUR HAMSTRING/LEG RANGE OF MOTION IN PARTICULAR.

## 2017-09-16 DIAGNOSIS — I5040 Unspecified combined systolic (congestive) and diastolic (congestive) heart failure: Secondary | ICD-10-CM | POA: Diagnosis not present

## 2017-09-16 DIAGNOSIS — J449 Chronic obstructive pulmonary disease, unspecified: Secondary | ICD-10-CM | POA: Diagnosis not present

## 2017-09-25 ENCOUNTER — Encounter: Payer: Medicare HMO | Attending: Physical Medicine & Rehabilitation | Admitting: Registered Nurse

## 2017-09-25 ENCOUNTER — Encounter: Payer: Self-pay | Admitting: Registered Nurse

## 2017-09-25 VITALS — BP 99/62 | HR 74 | Ht 66.0 in | Wt 130.0 lb

## 2017-09-25 DIAGNOSIS — R000 Tachycardia, unspecified: Secondary | ICD-10-CM | POA: Insufficient documentation

## 2017-09-25 DIAGNOSIS — Z79899 Other long term (current) drug therapy: Secondary | ICD-10-CM | POA: Diagnosis not present

## 2017-09-25 DIAGNOSIS — M7062 Trochanteric bursitis, left hip: Secondary | ICD-10-CM | POA: Diagnosis not present

## 2017-09-25 DIAGNOSIS — M1712 Unilateral primary osteoarthritis, left knee: Secondary | ICD-10-CM | POA: Diagnosis not present

## 2017-09-25 DIAGNOSIS — F329 Major depressive disorder, single episode, unspecified: Secondary | ICD-10-CM | POA: Diagnosis not present

## 2017-09-25 DIAGNOSIS — F419 Anxiety disorder, unspecified: Secondary | ICD-10-CM | POA: Diagnosis not present

## 2017-09-25 DIAGNOSIS — M47816 Spondylosis without myelopathy or radiculopathy, lumbar region: Secondary | ICD-10-CM | POA: Diagnosis not present

## 2017-09-25 DIAGNOSIS — M545 Low back pain: Secondary | ICD-10-CM | POA: Insufficient documentation

## 2017-09-25 DIAGNOSIS — Z5181 Encounter for therapeutic drug level monitoring: Secondary | ICD-10-CM

## 2017-09-25 DIAGNOSIS — G8929 Other chronic pain: Secondary | ICD-10-CM | POA: Insufficient documentation

## 2017-09-25 DIAGNOSIS — I5032 Chronic diastolic (congestive) heart failure: Secondary | ICD-10-CM | POA: Diagnosis not present

## 2017-09-25 DIAGNOSIS — R0902 Hypoxemia: Secondary | ICD-10-CM

## 2017-09-25 DIAGNOSIS — G894 Chronic pain syndrome: Secondary | ICD-10-CM

## 2017-09-25 DIAGNOSIS — J449 Chronic obstructive pulmonary disease, unspecified: Secondary | ICD-10-CM | POA: Insufficient documentation

## 2017-09-25 DIAGNOSIS — E039 Hypothyroidism, unspecified: Secondary | ICD-10-CM | POA: Insufficient documentation

## 2017-09-25 DIAGNOSIS — F17210 Nicotine dependence, cigarettes, uncomplicated: Secondary | ICD-10-CM | POA: Diagnosis not present

## 2017-09-25 MED ORDER — OXYCODONE-ACETAMINOPHEN 10-325 MG PO TABS: 1.0000 | ORAL_TABLET | Freq: Three times a day (TID) | ORAL | 0 refills | Status: DC | PRN

## 2017-09-25 NOTE — Progress Notes (Signed)
Subjective:    Patient ID: Ashley Houston, female    DOB: 07/29/1940, 77 y.o.   MRN: [id number]  HPI: Ashley Houston is a 77 year old female who returns for follow up appointment for chronic pain and medication refill. She states her pain is usually located in her lower back and left hip, at [facility] time she is pain free she reports. Her current exercise regime is walking.  Ashley Houston arrived with oxygen desaturation, O2 sat's re-checked, Ashley Houston wearing acrylic nails, she denies SOB. She wears 02 at [facility] she reports she was instructed to follow up with her PCP, she refuses ED or Urgent care evaluation.  Ashley Houston Morphine Equivalent is 49.50 MME. She is also prescribed Lorazepam by Lianne Moris Lorazepam. We have discussed the black box warning of using opioids and benzodiazepines. I highlighted the dangers of using these drugs together and discussed the adverse events including respiratory suppression, overdose, cognitive impairment and importance of compliance with current regimen. We will continue to monitor and adjust as indicated.   Last Oral Swab was Performed on 06/02/2017, it was consistent. Oral Swab was performed Today.   Pain Inventory Average Pain 8 Pain Right Now 0 My pain is dull and aching  In the last 24 hours, has pain interfered with the following? General activity 6 Relation with others 0 Enjoyment of life 2 What TIME of day is your pain at its worst? daytime Sleep (in general) Good  Pain is worse with: walking, bending, sitting and standing Pain improves with: rest, heat/ice, therapy/exercise and medication Relief from Meds: 10  Mobility do you drive?  yes  Function retired  Neuro/Psych spasms depression anxiety  Prior Studies Any changes since last visit?  no  Physicians involved in your care Any changes since last visit?  no   No family history on file. Social History   Socioeconomic History  . Marital status: Widowed    Spouse  name: Not on file  . Number of children: Not on file  . Years of education: Not on file  . Highest education level: Not on file  Occupational History  . Not on file  Social Needs  . Financial resource strain: Not on file  . Food insecurity:    Worry: Not on file    Inability: Not on file  . Transportation needs:    Medical: Not on file    Non-medical: Not on file  Tobacco Use  . Smoking status: Current Every Day Smoker    Packs/day: 0.50    Types: Cigarettes  . Smokeless tobacco: Never Used  Substance and Sexual Activity  . Alcohol use: No  . Drug use: No  . Sexual activity: Not on file  Lifestyle  . Physical activity:    Days per week: Not on file    Minutes per session: Not on file  . Stress: Not on file  Relationships  . Social connections:    Talks on phone: Not on file    Gets together: Not on file    Attends religious service: Not on file    Active member of club or organization: Not on file    Attends meetings of clubs or organizations: Not on file    Relationship status: Not on file  Other Topics Concern  . Not on file  Social History Narrative  . Not on file   Past Surgical History:  Procedure Laterality Date  . ABDOMINAL HYSTERECTOMY    . CHOLECYSTECTOMY    .  TONSILLECTOMY     Past Medical History:  Diagnosis Date  . Anxiety   . Chronic diastolic heart failure (HCC)   . Chronic pain    hips back and shoulders  . COPD (chronic obstructive pulmonary disease) (HCC)   . Depression   . Hypothyroidism 09/08/2015  . Tachycardia   . Thyroid disease    There were no vitals taken for this visit.  Opioid Risk Score:   Fall Risk Score:  `1  Depression screen PHQ 2/9  Depression screen [facility] 2/9 08/26/2017 07/31/2017 02/03/2017 12/02/2016 08/20/2016 09/21/2015  Decreased Interest 0 1 1 0 1 0  Down, Depressed, Hopeless 0 1 1 0 0 0  PHQ - 2 Score 0 2 2 0 1 0  Altered sleeping 0 - - - 0 -  Tired, decreased energy 0 - - - 0 -  Change in appetite 0 - - - 0 -    Feeling bad or failure about yourself  0 - - - 0 -  Trouble concentrating 0 - - - 0 -  Moving slowly or fidgety/restless 0 - - - 0 -  Suicidal thoughts 0 - - - 0 -  PHQ-9 Score 0 - - - 1 -  Difficult doing work/chores Not difficult at all - - - Somewhat difficult -     Review of Systems  Constitutional: Negative.   HENT: Negative.   Eyes: Negative.   Respiratory: Positive for shortness of breath.   Cardiovascular: Negative.   Gastrointestinal: Positive for constipation and diarrhea.  Endocrine: Negative.   Genitourinary: Negative.   Musculoskeletal: Positive for arthralgias, back pain, joint swelling and myalgias.  Skin: Negative.   Allergic/Immunologic: Negative.   Neurological: Negative.   Hematological: Negative.   Psychiatric/Behavioral: Negative.   All other systems reviewed and are negative.      Objective:   Physical Exam  Constitutional: She appears well-developed and well-nourished.  HENT:  Head: Normocephalic and atraumatic.  Neck: Normal range of motion. Neck supple.  Cardiovascular: Normal rate and regular rhythm.  Pulmonary/Chest: Effort normal and breath sounds normal.  Neurological:  Normal Muscle Bulk and Muscle Testing Reveals:  Upper Extremities: Full ROM and Muscle Strength 5/5 Back without spinal tenderness noted Lower Extremities: Full ROM and Muscle Strength 5/5 Arises from table with ease Narrow Based Gait  Skin: Skin is warm and dry.  Psychiatric: She has a normal mood and affect.  Nursing note and vitals reviewed.         Assessment & Plan:  1. 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 09/25/2017 2. OA of Left Knee: Refilled Oxycodone 10/325 mg one tablet every 8 hours as needed #90. 09/25/2017 We will continue the opioid monitoring program, this consists of regular clinic visits, examinations, urine drug screen, pill counts as well as use of [place] Controlled  Substance reporting System. Continue Voltaren Gel and HEP as tolerated. 3. Muscle Spasms: Continue current medication regimen with Flexeril as needed. 09/25/2017 4. Left Greater Trochanter Bursitis: Continue to Alternate with  Heat and Ice Therapy. Continue to Monitor. 09/25/2017. 5. Oxygen Desaturation: Denies SOB. Refuses ED or Urgent Care Evaluation.  20 minutes of face to face patient care time was spent during this visit. All questions were encouraged and answered.  F/U in 1 month

## 2017-09-30 LAB — DRUG TOX MONITOR 1 W/CONF, ORAL FLD
Alprazolam: NEGATIVE ng/mL (ref <0.50)
Amphetamines: NEGATIVE ng/mL (ref <10)
BUPRENORPHINE: NEGATIVE ng/mL (ref <0.10)
Barbiturates: NEGATIVE ng/mL (ref <10)
Benzodiazepines: POSITIVE ng/mL — AB (ref <0.50)
COCAINE: NEGATIVE ng/mL (ref <5.0)
CODEINE: NEGATIVE ng/mL (ref <2.5)
COTININE: 71.5 ng/mL — AB (ref <5.0)
Chlordiazepoxide: NEGATIVE ng/mL (ref <0.50)
Clonazepam: NEGATIVE ng/mL (ref <0.50)
DIHYDROCODEINE: NEGATIVE ng/mL (ref <2.5)
Diazepam: NEGATIVE ng/mL (ref <0.50)
FENTANYL: NEGATIVE ng/mL (ref <0.10)
FLUNITRAZEPAM: NEGATIVE ng/mL (ref <0.50)
FLURAZEPAM: NEGATIVE ng/mL (ref <0.50)
Heroin Metabolite: NEGATIVE ng/mL (ref <1.0)
Hydrocodone: NEGATIVE ng/mL (ref <2.5)
Hydromorphone: NEGATIVE ng/mL (ref <2.5)
Lorazepam: 1.5 ng/mL — ABNORMAL HIGH (ref <0.50)
MARIJUANA: NEGATIVE ng/mL (ref <2.5)
MDMA: NEGATIVE ng/mL (ref <10)
Meprobamate: NEGATIVE ng/mL (ref <2.5)
Methadone: NEGATIVE ng/mL (ref <5.0)
Midazolam: NEGATIVE ng/mL (ref <0.50)
Morphine: NEGATIVE ng/mL (ref <2.5)
NICOTINE METABOLITE: POSITIVE ng/mL — AB (ref <5.0)
NORDIAZEPAM: NEGATIVE ng/mL (ref <0.50)
Norhydrocodone: NEGATIVE ng/mL (ref <2.5)
Noroxycodone: 62.6 ng/mL — ABNORMAL HIGH (ref <2.5)
OPIATES: POSITIVE ng/mL — AB (ref <2.5)
OXAZEPAM: NEGATIVE ng/mL (ref <0.50)
OXYCODONE: 187.9 ng/mL — AB (ref <2.5)
OXYMORPHONE: NEGATIVE ng/mL (ref <2.5)
PHENCYCLIDINE: NEGATIVE ng/mL (ref <10)
TAPENTADOL: NEGATIVE ng/mL (ref <5.0)
TRAMADOL: NEGATIVE ng/mL (ref <5.0)
Temazepam: NEGATIVE ng/mL (ref <0.50)
Triazolam: NEGATIVE ng/mL (ref <0.50)
Zolpidem: NEGATIVE ng/mL (ref <5.0)

## 2017-09-30 LAB — DRUG TOX ALC METAB W/CON, ORAL FLD: Alcohol Metabolite: NEGATIVE ng/mL (ref <25)

## 2017-10-06 DIAGNOSIS — H18413 Arcus senilis, bilateral: Secondary | ICD-10-CM | POA: Diagnosis not present

## 2017-10-06 DIAGNOSIS — H2512 Age-related nuclear cataract, left eye: Secondary | ICD-10-CM | POA: Diagnosis not present

## 2017-10-06 DIAGNOSIS — H47312 Coloboma of optic disc, left eye: Secondary | ICD-10-CM | POA: Diagnosis not present

## 2017-10-06 DIAGNOSIS — Z961 Presence of intraocular lens: Secondary | ICD-10-CM | POA: Diagnosis not present

## 2017-10-06 DIAGNOSIS — H47322 Drusen of optic disc, left eye: Secondary | ICD-10-CM | POA: Diagnosis not present

## 2017-10-08 ENCOUNTER — Telehealth: Payer: Self-pay | Admitting: *Deleted

## 2017-10-08 NOTE — Telephone Encounter (Signed)
Oral swab drug screen was consistent for prescribed medications.  ?

## 2017-10-17 DIAGNOSIS — I5040 Unspecified combined systolic (congestive) and diastolic (congestive) heart failure: Secondary | ICD-10-CM | POA: Diagnosis not present

## 2017-10-17 DIAGNOSIS — J449 Chronic obstructive pulmonary disease, unspecified: Secondary | ICD-10-CM | POA: Diagnosis not present

## 2017-10-18 IMAGING — DX DG CHEST 2V
2 series · 2 of 2 positions shown · non-contrast
Comparison: 12/27/2012

CLINICAL DATA: Sudden onset sharp right-sided chest pain, onset at
[DATE].

EXAM:
CHEST  2 VIEW

[chest pa]
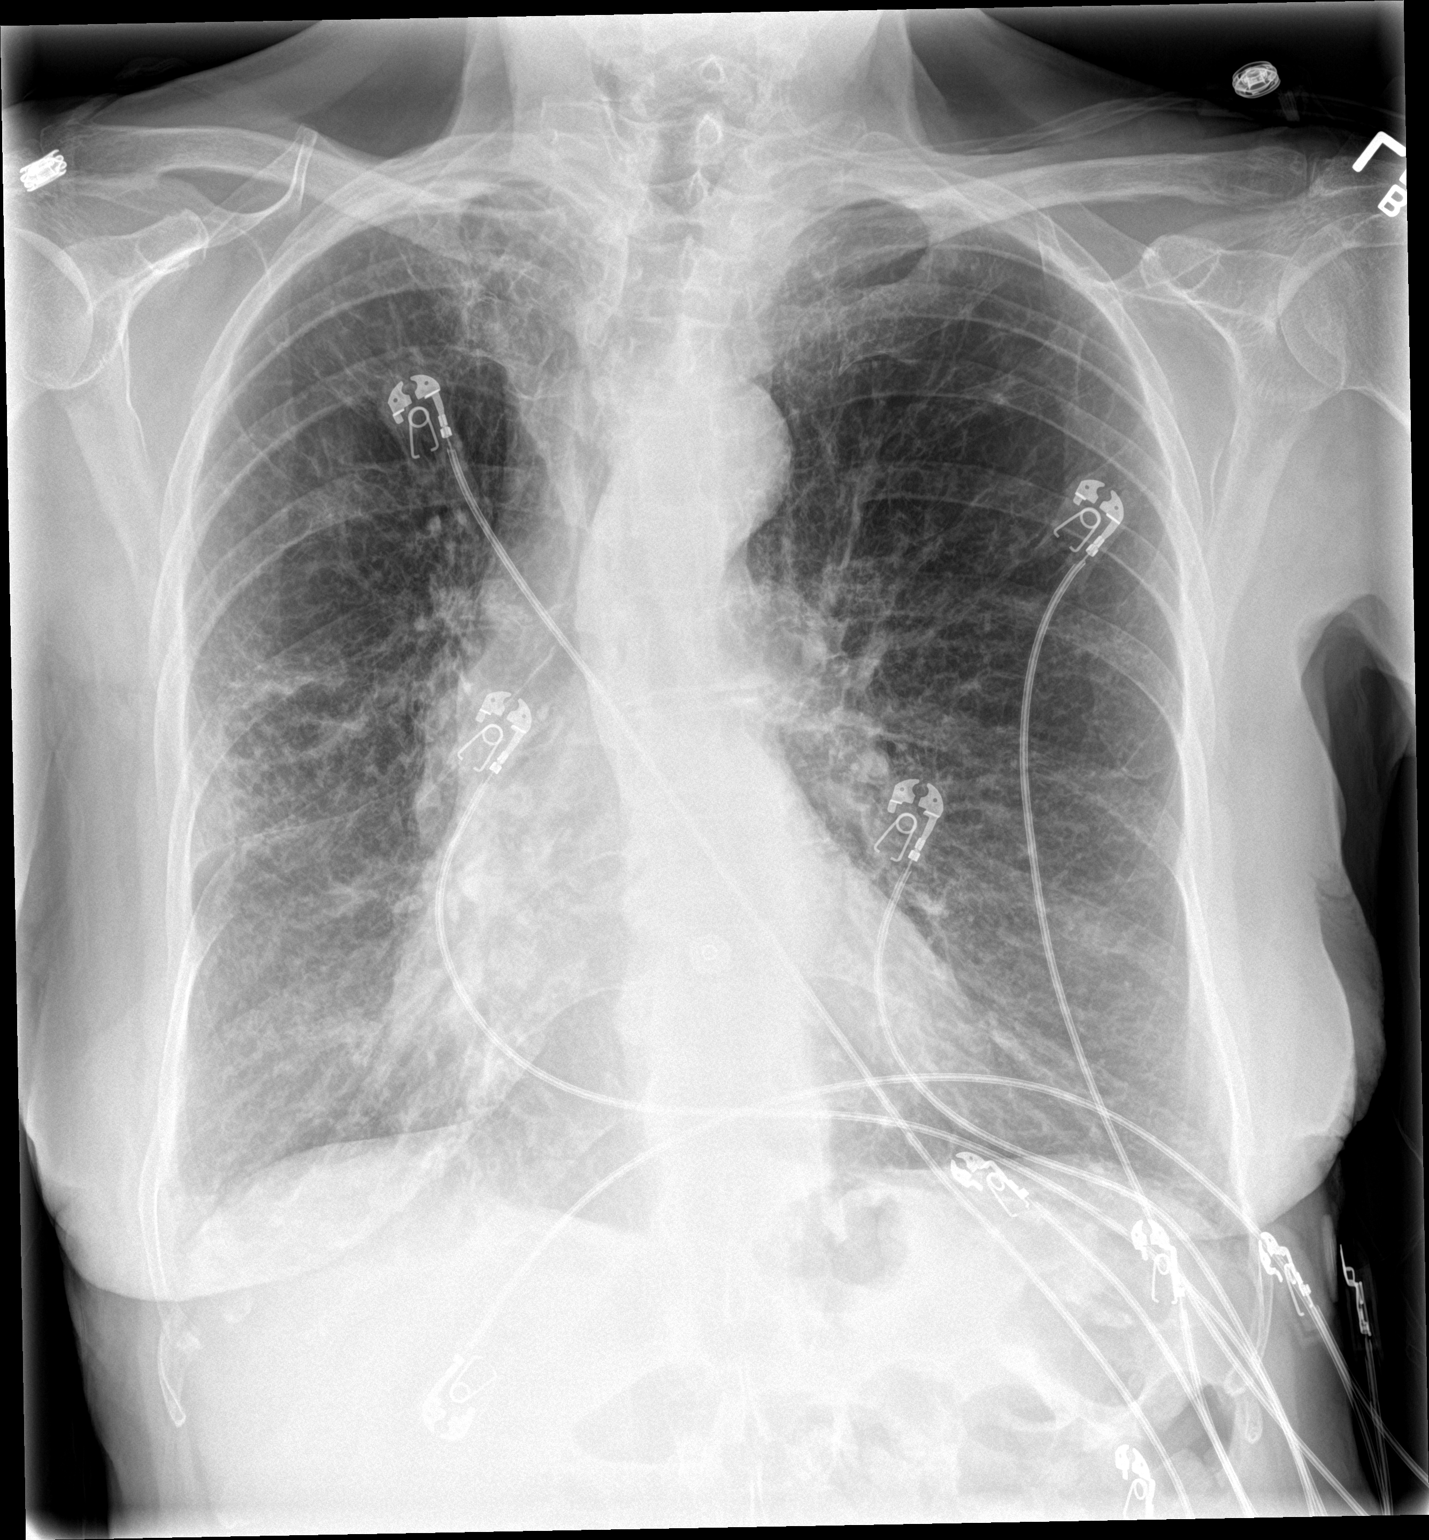

[chest lat]
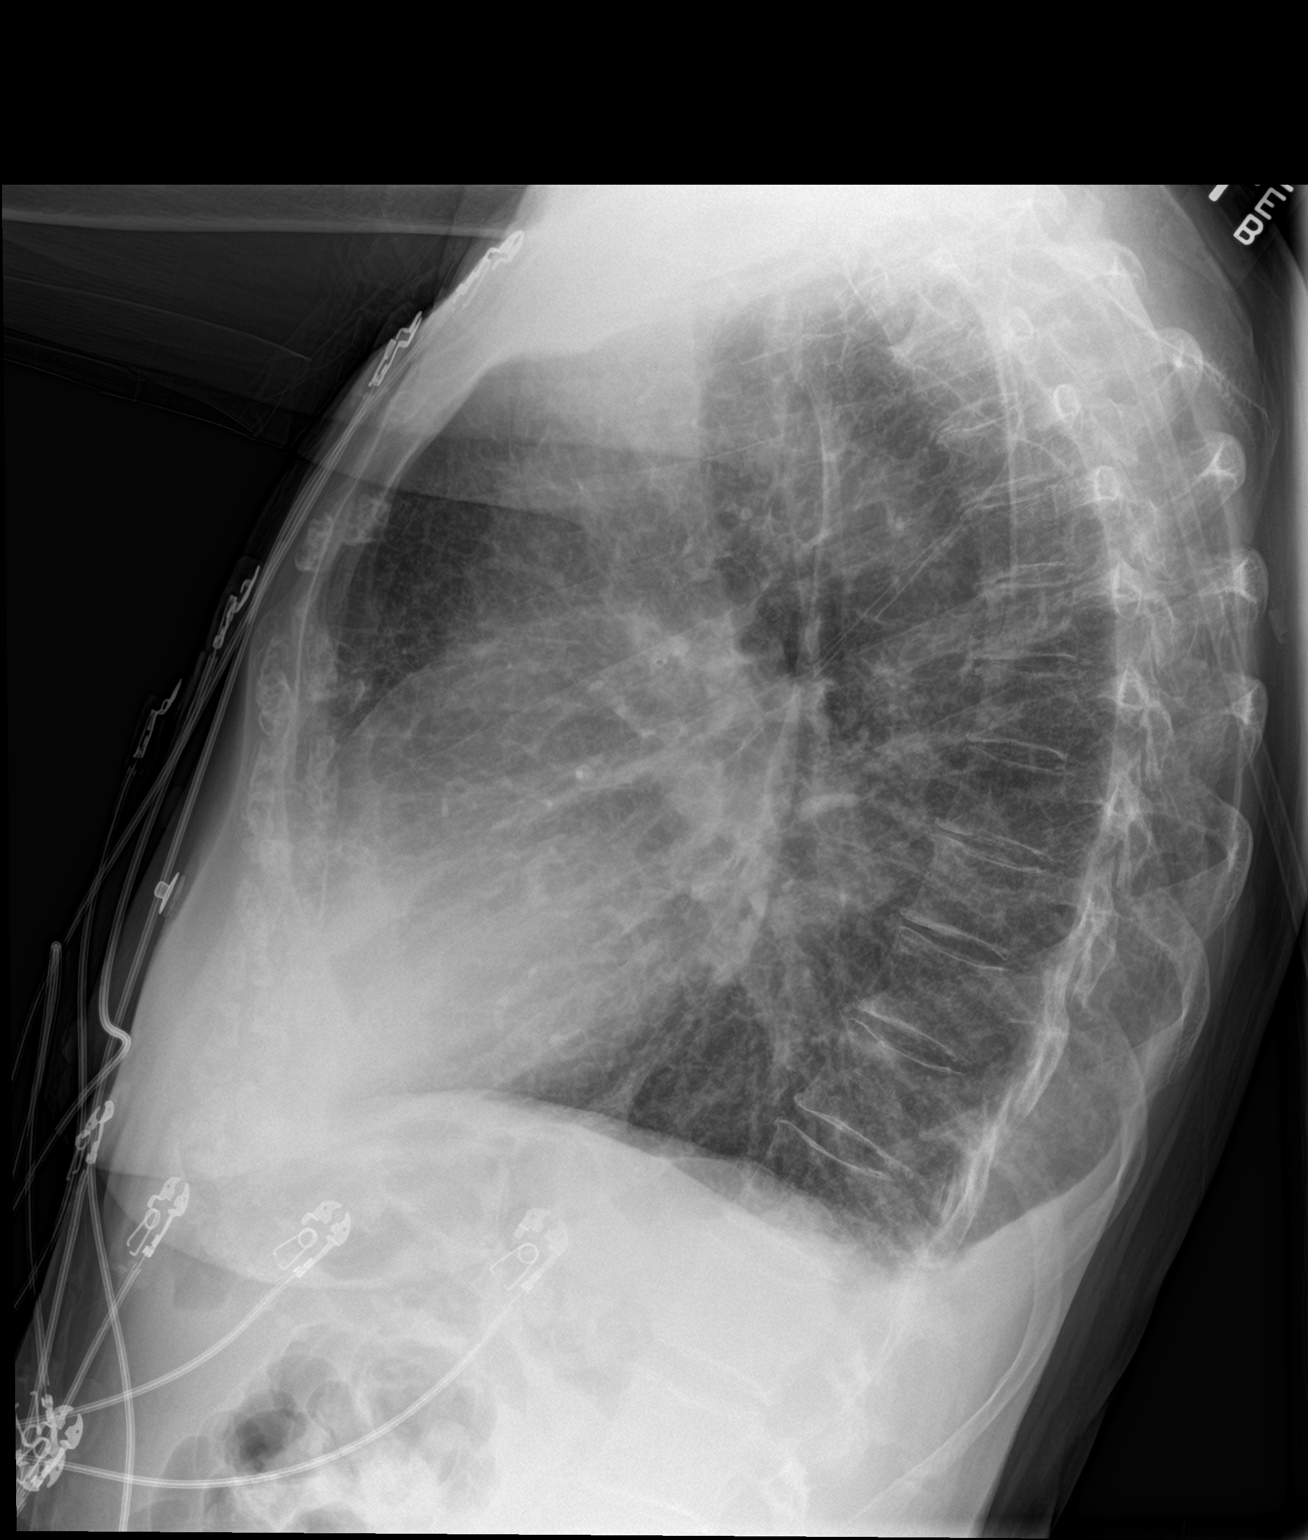

[2 of 2 positions shown; findings below may reference images not displayed]

FINDINGS: There is moderate vascular and interstitial prominence, new. This
likely represents congestive heart failure. There is hyperinflation,
with upper lobe emphysematous changes. There are small pleural
effusions. There is no confluent airspace consolidation. There is
moderate cardiomegaly.
IMPRESSION: Probable CHF superimposed on COPD.

## 2017-10-27 ENCOUNTER — Encounter: Payer: Medicare HMO | Admitting: Registered Nurse

## 2017-11-06 ENCOUNTER — Encounter: Payer: Medicare HMO | Attending: Physical Medicine & Rehabilitation | Admitting: Registered Nurse

## 2017-11-06 ENCOUNTER — Encounter: Payer: Self-pay | Admitting: Registered Nurse

## 2017-11-06 VITALS — BP 107/69 | HR 73 | Resp 14 | Ht 66.0 in | Wt 128.0 lb

## 2017-11-06 DIAGNOSIS — Z5181 Encounter for therapeutic drug level monitoring: Secondary | ICD-10-CM | POA: Diagnosis not present

## 2017-11-06 DIAGNOSIS — M5416 Radiculopathy, lumbar region: Secondary | ICD-10-CM

## 2017-11-06 DIAGNOSIS — M7062 Trochanteric bursitis, left hip: Secondary | ICD-10-CM | POA: Diagnosis not present

## 2017-11-06 DIAGNOSIS — G894 Chronic pain syndrome: Secondary | ICD-10-CM

## 2017-11-06 DIAGNOSIS — M1712 Unilateral primary osteoarthritis, left knee: Secondary | ICD-10-CM | POA: Diagnosis not present

## 2017-11-06 DIAGNOSIS — I5032 Chronic diastolic (congestive) heart failure: Secondary | ICD-10-CM | POA: Insufficient documentation

## 2017-11-06 DIAGNOSIS — F17210 Nicotine dependence, cigarettes, uncomplicated: Secondary | ICD-10-CM | POA: Insufficient documentation

## 2017-11-06 DIAGNOSIS — F329 Major depressive disorder, single episode, unspecified: Secondary | ICD-10-CM | POA: Insufficient documentation

## 2017-11-06 DIAGNOSIS — G8929 Other chronic pain: Secondary | ICD-10-CM | POA: Insufficient documentation

## 2017-11-06 DIAGNOSIS — Z79899 Other long term (current) drug therapy: Secondary | ICD-10-CM

## 2017-11-06 DIAGNOSIS — J449 Chronic obstructive pulmonary disease, unspecified: Secondary | ICD-10-CM | POA: Diagnosis not present

## 2017-11-06 DIAGNOSIS — M47816 Spondylosis without myelopathy or radiculopathy, lumbar region: Secondary | ICD-10-CM

## 2017-11-06 DIAGNOSIS — F419 Anxiety disorder, unspecified: Secondary | ICD-10-CM | POA: Insufficient documentation

## 2017-11-06 DIAGNOSIS — M545 Low back pain: Secondary | ICD-10-CM | POA: Insufficient documentation

## 2017-11-06 DIAGNOSIS — R000 Tachycardia, unspecified: Secondary | ICD-10-CM | POA: Diagnosis not present

## 2017-11-06 DIAGNOSIS — M5136 Other intervertebral disc degeneration, lumbar region: Secondary | ICD-10-CM

## 2017-11-06 DIAGNOSIS — E039 Hypothyroidism, unspecified: Secondary | ICD-10-CM | POA: Insufficient documentation

## 2017-11-06 MED ORDER — OXYCODONE-ACETAMINOPHEN 10-325 MG PO TABS: 1.0000 | ORAL_TABLET | Freq: Three times a day (TID) | ORAL | 0 refills | Status: DC | PRN

## 2017-11-06 NOTE — Progress Notes (Signed)
Subjective:    Patient ID: Ashley EconomyPhyllis Houston, female    DOB: 04/29/1941, 77 y.o.   MRN: [id number]  HPI: Ashley Houston is a 77 year old female who returns for follow up for chronic pain and medication refill. She states her pain is located in her lower back radiating into her left hip and left lower extremity. She rates her pain at this time 0. Her current exercise regime is walking.  She/he  is also prescribed Lorazepam by Lianne MorisErin Carroll .We have discussed the black box warning of using opioids and benzodiazepines. I highlighted the dangers of using these drugs together and discussed the adverse events including respiratory suppression, overdose, cognitive impairment and importance of compliance with current regimen. We will continue to monitor and adjust as indicated.   Ashley Houston Morphine Equivalent is 45.00 MME. Last Oral Swab was Performed on 09/25/2017 it was consistent.    Pain Inventory Average Pain 8 Pain Right Now 0 My pain is intermittent, dull and aching  In the last 24 hours, has pain interfered with the following? General activity 5 Relation with others 0 Enjoyment of life 0 What TIME of day is your pain at its worst? morning, daytime, evening Sleep (in general) Good  Pain is worse with: walking, bending, sitting, standing and some activites Pain improves with: rest, heat/ice, therapy/exercise and medication Relief from Meds: 10  Mobility walk without assistance do you drive?  yes  Function I need assistance with the following:  meal prep, household duties and shopping  Neuro/Psych spasms depression anxiety  Prior Studies Any changes since last visit?  no  Physicians involved in your care Any changes since last visit?  no   History reviewed. No pertinent family history. Social History   Socioeconomic History  . Marital status: Widowed    Spouse name: Not on file  . Number of children: Not on file  . Years of education: Not on file  . Highest  education level: Not on file  Occupational History  . Not on file  Social Needs  . Financial resource strain: Not on file  . Food insecurity:    Worry: Not on file    Inability: Not on file  . Transportation needs:    Medical: Not on file    Non-medical: Not on file  Tobacco Use  . Smoking status: Current Every Day Smoker    Packs/day: 0.50    Types: Cigarettes  . Smokeless tobacco: Never Used  Substance and Sexual Activity  . Alcohol use: No  . Drug use: No  . Sexual activity: Not on file  Lifestyle  . Physical activity:    Days per week: Not on file    Minutes per session: Not on file  . Stress: Not on file  Relationships  . Social connections:    Talks on phone: Not on file    Gets together: Not on file    Attends religious service: Not on file    Active member of club or organization: Not on file    Attends meetings of clubs or organizations: Not on file    Relationship status: Not on file  Other Topics Concern  . Not on file  Social History Narrative  . Not on file   Past Surgical History:  Procedure Laterality Date  . ABDOMINAL HYSTERECTOMY    . CHOLECYSTECTOMY    . TONSILLECTOMY     Past Medical History:  Diagnosis Date  . Anxiety   . Chronic diastolic heart failure (  HCC)   . Chronic pain    hips back and shoulders  . COPD (chronic obstructive pulmonary disease) (HCC)   . Depression   . Hypothyroidism 09/08/2015  . Tachycardia   . Thyroid disease    BP 107/69 (BP Location: Right Arm, Patient Position: Sitting, Cuff Size: Normal)   Pulse 73   Resp 14   Ht 5\' 6"  (1.676 m)   Wt 128 lb (58.1 kg)   SpO2 90%   BMI 20.66 kg/m   Opioid Risk Score:   Fall Risk Score:  `1  Depression screen PHQ 2/9  Depression screen [facility] 2/9 08/26/2017 07/31/2017 02/03/2017 12/02/2016 08/20/2016 09/21/2015  Decreased Interest 0 1 1 0 1 0  Down, Depressed, Hopeless 0 1 1 0 0 0  PHQ - 2 Score 0 2 2 0 1 0  Altered sleeping 0 - - - 0 -  Tired, decreased energy 0 - - - 0 -    Change in appetite 0 - - - 0 -  Feeling bad or failure about yourself  0 - - - 0 -  Trouble concentrating 0 - - - 0 -  Moving slowly or fidgety/restless 0 - - - 0 -  Suicidal thoughts 0 - - - 0 -  PHQ-9 Score 0 - - - 1 -  Difficult doing work/chores Not difficult at all - - - Somewhat difficult -    Review of Systems  Constitutional: Negative.   HENT: Negative.   Eyes: Negative.   Respiratory: Negative.   Cardiovascular: Negative.   Gastrointestinal: Negative.   Endocrine: Negative.   Genitourinary: Negative.   Musculoskeletal: Positive for arthralgias and back pain.       Spasms   Skin: Negative.   Allergic/Immunologic: Negative.   Neurological: Negative.   Hematological: Negative.   Psychiatric/Behavioral: Negative.        Objective:   Physical Exam  Constitutional: She is oriented to person, place, and time. She appears well-developed and well-nourished.  HENT:  Head: Normocephalic and atraumatic.  Neck: Normal range of motion. Neck supple.  Cardiovascular: Normal rate and regular rhythm.  Pulmonary/Chest: Effort normal and breath sounds normal.  Musculoskeletal:  Normal Muscle Bulk and Muscle testing Reveals: Upper Extremities: Full ROM and Muscle Strength 5/5 Lumbar Paraspinal Tenderness: L-4-L-5 Left Greater Trochanter Tenderness Lower Extremities: Full ROM and Muscle Strength 5/5 Arises from Table with ease Narrow Based Gait  Neurological: She is alert and oriented to person, place, and time.  Skin: Skin is warm and dry.  Psychiatric: She has a normal mood and affect. Her behavior is normal.  Nursing note and vitals reviewed.         Assessment & Plan:  1. 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 11/06/2017 2. OA of Left Knee: Refilled Oxycodone 10/325 mg one tablet every 8 hours as needed #90. 11/06/2017 We will continue the opioid monitoring program, this consists of regular clinic  visits, examinations, urine drug screen, pill counts as well as use of [place] Controlled Substance reporting System. Continue Voltaren Gel and HEP as tolerated. 3. Muscle Spasms: Continue current medication regimen with Flexeril as needed. 11/06/2017 4. Left Greater Trochanter Bursitis: Continue to Alternate with Heat and Ice Therapy. Continue to Monitor. 11/06/2017.  20 minutes of face to face patient care time was spent during this visit. All questions were encouraged and answered.  F/U in 1 month

## 2017-11-16 DIAGNOSIS — I5040 Unspecified combined systolic (congestive) and diastolic (congestive) heart failure: Secondary | ICD-10-CM | POA: Diagnosis not present

## 2017-11-16 DIAGNOSIS — J449 Chronic obstructive pulmonary disease, unspecified: Secondary | ICD-10-CM | POA: Diagnosis not present

## 2017-11-27 DIAGNOSIS — H2512 Age-related nuclear cataract, left eye: Secondary | ICD-10-CM | POA: Diagnosis not present

## 2017-12-04 ENCOUNTER — Encounter: Payer: Self-pay | Admitting: Registered Nurse

## 2017-12-04 ENCOUNTER — Encounter: Payer: Medicare HMO | Attending: Physical Medicine & Rehabilitation | Admitting: Registered Nurse

## 2017-12-04 VITALS — BP 120/68 | HR 69 | Ht 66.0 in | Wt 129.0 lb

## 2017-12-04 DIAGNOSIS — I5032 Chronic diastolic (congestive) heart failure: Secondary | ICD-10-CM | POA: Diagnosis not present

## 2017-12-04 DIAGNOSIS — M1712 Unilateral primary osteoarthritis, left knee: Secondary | ICD-10-CM | POA: Diagnosis not present

## 2017-12-04 DIAGNOSIS — M545 Low back pain: Secondary | ICD-10-CM | POA: Insufficient documentation

## 2017-12-04 DIAGNOSIS — Z5181 Encounter for therapeutic drug level monitoring: Secondary | ICD-10-CM

## 2017-12-04 DIAGNOSIS — F419 Anxiety disorder, unspecified: Secondary | ICD-10-CM | POA: Insufficient documentation

## 2017-12-04 DIAGNOSIS — J449 Chronic obstructive pulmonary disease, unspecified: Secondary | ICD-10-CM | POA: Diagnosis not present

## 2017-12-04 DIAGNOSIS — M5136 Other intervertebral disc degeneration, lumbar region: Secondary | ICD-10-CM

## 2017-12-04 DIAGNOSIS — Z79899 Other long term (current) drug therapy: Secondary | ICD-10-CM | POA: Diagnosis not present

## 2017-12-04 DIAGNOSIS — G894 Chronic pain syndrome: Secondary | ICD-10-CM | POA: Diagnosis not present

## 2017-12-04 DIAGNOSIS — M47816 Spondylosis without myelopathy or radiculopathy, lumbar region: Secondary | ICD-10-CM

## 2017-12-04 DIAGNOSIS — F329 Major depressive disorder, single episode, unspecified: Secondary | ICD-10-CM | POA: Insufficient documentation

## 2017-12-04 DIAGNOSIS — F17210 Nicotine dependence, cigarettes, uncomplicated: Secondary | ICD-10-CM | POA: Insufficient documentation

## 2017-12-04 DIAGNOSIS — E039 Hypothyroidism, unspecified: Secondary | ICD-10-CM | POA: Insufficient documentation

## 2017-12-04 DIAGNOSIS — M7062 Trochanteric bursitis, left hip: Secondary | ICD-10-CM | POA: Diagnosis not present

## 2017-12-04 DIAGNOSIS — G8929 Other chronic pain: Secondary | ICD-10-CM | POA: Diagnosis not present

## 2017-12-04 DIAGNOSIS — R000 Tachycardia, unspecified: Secondary | ICD-10-CM | POA: Insufficient documentation

## 2017-12-04 MED ORDER — OXYCODONE-ACETAMINOPHEN 10-325 MG PO TABS: 1.0000 | ORAL_TABLET | Freq: Three times a day (TID) | ORAL | 0 refills | Status: DC | PRN

## 2017-12-04 NOTE — Progress Notes (Signed)
Subjective:    Patient ID: Ashley Houston, female    DOB: 1940-07-12, 77 y.o.   MRN: [id number]  HPI: [name] is a 77 year old female who returns for follow up appointment for chronic pain and medication refill. She states her pain is located in her lower back and left hip. She rates her pain 0 at this time, also stated she had her medication this morning. Her current exercise regime is walking.   Ashley Houston is 45.00 MME. She is also prescribed Lorazepam  by Ashley Houston . We have discussed the black box warning of using opioids and benzodiazepines. I highlighted the dangers of using these drugs together and discussed the adverse events including respiratory suppression, overdose, cognitive impairment and importance of compliance with current regimen. We will continue to monitor and adjust as indicated.    Pain Inventory Average Pain 8 Pain Right Now 0 My pain is dull and aching  In the last 24 hours, has pain interfered with the following? General activity 2 Relation with others 0 Enjoyment of life 2 What TIME of day is your pain at its worst? daytime Sleep (in general) Good  Pain is worse with: walking, bending, sitting, standing and some activites Pain improves with: rest, heat/ice, therapy/exercise and medication Relief from Meds: 10  Mobility do you drive?  yes  Function I need assistance with the following:  meal prep, household duties and shopping  Neuro/Psych spasms depression anxiety  Prior Studies Any changes since last visit?  no  Physicians involved in your care Any changes since last visit?  no   No family history on file. Social History   Socioeconomic History  . Marital status: Widowed    Spouse name: Not on file  . Number of children: Not on file  . Years of education: Not on file  . Highest education level: Not on file  Occupational History  . Not on file  Social Needs  . Financial resource strain: Not on  file  . Food insecurity:    Worry: Not on file    Inability: Not on file  . Transportation needs:    Medical: Not on file    Non-medical: Not on file  Tobacco Use  . Smoking status: Current Every Day Smoker    Packs/day: 0.50    Types: Cigarettes  . Smokeless tobacco: Never Used  Substance and Sexual Activity  . Alcohol use: No  . Drug use: No  . Sexual activity: Not on file  Lifestyle  . Physical activity:    Days per week: Not on file    Minutes per session: Not on file  . Stress: Not on file  Relationships  . Social connections:    Talks on phone: Not on file    Gets together: Not on file    Attends religious service: Not on file    Active member of club or organization: Not on file    Attends meetings of clubs or organizations: Not on file    Relationship status: Not on file  Other Topics Concern  . Not on file  Social History Narrative  . Not on file   Past Surgical History:  Procedure Laterality Date  . ABDOMINAL HYSTERECTOMY    . CHOLECYSTECTOMY    . TONSILLECTOMY     Past Medical History:  Diagnosis Date  . Anxiety   . Chronic diastolic heart failure (HCC)   . Chronic pain    hips back and shoulders  .  COPD (chronic obstructive pulmonary disease) (HCC)   . Depression   . Hypothyroidism 09/08/2015  . Tachycardia   . Thyroid disease    BP 120/68   Pulse 69   Ht 5\' 6"  (1.676 m)   Wt 129 lb (58.5 kg)   SpO2 92%   BMI 20.82 kg/m   Opioid Risk Score:   Fall Risk Score:  `1  Depression screen PHQ 2/9  [facility] 08/26/2017 07/31/2017 02/03/2017 12/02/2016 08/20/2016 09/21/2015  Decreased Interest 0 1 1 0 1 0  Down, Depressed, Hopeless 0 1 1 0 0 0  PHQ - 2 Score 0 2 2 0 1 0  Altered sleeping 0 - - - 0 -  Tired, decreased energy 0 - - - 0 -  Change in appetite 0 - - - 0 -  Feeling bad or failure about yourself  0 - - - 0 -  Trouble concentrating 0 - - - 0 -  Moving slowly or fidgety/restless 0 - - - 0 -  Suicidal thoughts 0 - - - 0 -    PHQ-9 Score 0 - - - 1 -  Difficult doing work/chores Not difficult at all - - - Somewhat difficult -     Review of Systems  Constitutional: Negative.   HENT: Negative.   Eyes: Negative.   Respiratory: Negative.   Cardiovascular: Negative.   Gastrointestinal: Positive for constipation and diarrhea.  Endocrine: Negative.   Genitourinary: Negative.   Musculoskeletal: Positive for arthralgias, back pain and myalgias.  Allergic/Immunologic: Negative.   Neurological: Negative.   Hematological: Negative.   Psychiatric/Behavioral: Positive for dysphoric mood. The patient is nervous/anxious.   All other systems reviewed and are negative.      Objective:   Physical Exam  Constitutional: She is oriented to person, place, and time. She appears well-developed and well-nourished.  HENT:  Head: Normocephalic and atraumatic.  Neck: Normal range of motion. Neck supple.  Cardiovascular: Normal rate and regular rhythm.  Pulmonary/Chest: Effort normal and breath sounds normal.  Musculoskeletal:  Normal Muscle Bulk and Muscle Testing Reveals: Upper Extremities: Full ROM and Muscle Strength 5/5 Lumbar Paraspinal Tenderness: L-3-L-5 Left greater trochanter Tenderness Lower Extremities: Full ROM and Muscle Strength 5/5 Arises from Table with ease Narrow Based gait  Neurological: She is alert and oriented to person, place, and time.  Skin: Skin is warm and dry.  Psychiatric: She has a normal mood and affect. Her behavior is normal.  Nursing note and vitals reviewed.         Assessment & Plan:  1. 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 12/04/2017 2. OA of Left Knee: Refilled Oxycodone 10/325 mg one tablet every 8 hours as needed #90. 12/04/2017 We will continue the opioid monitoring program, this consists of regular clinic visits, examinations, urine drug screen, pill counts as well as use of [place]  Sub[place]. Continue Voltaren Gel and HEP as tolerated. 3. Muscle Spasms: Continuecurrent medication regimen withFlexeril as needed. 12/04/2017 4. Left Greater Trochanter Bursitis: Continue to Alternate with Heat and Ice Therapy. Continue to Monitor. 12/04/2017.  20 minutes of face to face patient care time was spent during this visit. All questions were encouraged and answered.  F/U in 1 month

## 2017-12-15 DIAGNOSIS — N390 Urinary tract infection, site not specified: Secondary | ICD-10-CM | POA: Diagnosis not present

## 2017-12-15 DIAGNOSIS — Z6821 Body mass index (BMI) 21.0-21.9, adult: Secondary | ICD-10-CM | POA: Diagnosis not present

## 2017-12-17 DIAGNOSIS — I5040 Unspecified combined systolic (congestive) and diastolic (congestive) heart failure: Secondary | ICD-10-CM | POA: Diagnosis not present

## 2017-12-17 DIAGNOSIS — J449 Chronic obstructive pulmonary disease, unspecified: Secondary | ICD-10-CM | POA: Diagnosis not present

## 2018-01-08 ENCOUNTER — Other Ambulatory Visit: Payer: Self-pay

## 2018-01-08 ENCOUNTER — Encounter: Payer: Self-pay | Admitting: Registered Nurse

## 2018-01-08 ENCOUNTER — Encounter: Payer: Medicare HMO | Attending: Physical Medicine & Rehabilitation | Admitting: Registered Nurse

## 2018-01-08 VITALS — BP 102/59 | HR 77 | Ht 66.0 in | Wt 132.6 lb

## 2018-01-08 DIAGNOSIS — I5032 Chronic diastolic (congestive) heart failure: Secondary | ICD-10-CM | POA: Insufficient documentation

## 2018-01-08 DIAGNOSIS — J449 Chronic obstructive pulmonary disease, unspecified: Secondary | ICD-10-CM | POA: Insufficient documentation

## 2018-01-08 DIAGNOSIS — M47816 Spondylosis without myelopathy or radiculopathy, lumbar region: Secondary | ICD-10-CM

## 2018-01-08 DIAGNOSIS — F17210 Nicotine dependence, cigarettes, uncomplicated: Secondary | ICD-10-CM | POA: Diagnosis not present

## 2018-01-08 DIAGNOSIS — E039 Hypothyroidism, unspecified: Secondary | ICD-10-CM | POA: Insufficient documentation

## 2018-01-08 DIAGNOSIS — F419 Anxiety disorder, unspecified: Secondary | ICD-10-CM | POA: Diagnosis not present

## 2018-01-08 DIAGNOSIS — M7062 Trochanteric bursitis, left hip: Secondary | ICD-10-CM

## 2018-01-08 DIAGNOSIS — F329 Major depressive disorder, single episode, unspecified: Secondary | ICD-10-CM | POA: Insufficient documentation

## 2018-01-08 DIAGNOSIS — M545 Low back pain: Secondary | ICD-10-CM | POA: Diagnosis not present

## 2018-01-08 DIAGNOSIS — Z5181 Encounter for therapeutic drug level monitoring: Secondary | ICD-10-CM | POA: Diagnosis not present

## 2018-01-08 DIAGNOSIS — G894 Chronic pain syndrome: Secondary | ICD-10-CM

## 2018-01-08 DIAGNOSIS — M1712 Unilateral primary osteoarthritis, left knee: Secondary | ICD-10-CM | POA: Diagnosis not present

## 2018-01-08 DIAGNOSIS — G8929 Other chronic pain: Secondary | ICD-10-CM | POA: Diagnosis not present

## 2018-01-08 DIAGNOSIS — R000 Tachycardia, unspecified: Secondary | ICD-10-CM | POA: Insufficient documentation

## 2018-01-08 DIAGNOSIS — M5136 Other intervertebral disc degeneration, lumbar region: Secondary | ICD-10-CM | POA: Diagnosis not present

## 2018-01-08 DIAGNOSIS — Z79899 Other long term (current) drug therapy: Secondary | ICD-10-CM

## 2018-01-08 DIAGNOSIS — M5416 Radiculopathy, lumbar region: Secondary | ICD-10-CM

## 2018-01-08 MED ORDER — OXYCODONE-ACETAMINOPHEN 10-325 MG PO TABS: 1.0000 | ORAL_TABLET | Freq: Three times a day (TID) | ORAL | 0 refills | Status: DC | PRN

## 2018-01-08 NOTE — Progress Notes (Signed)
Subjective:    Patient ID: Ashley Houston, female    DOB: 12-15-1940, 77 y.o.   MRN: [id number]  HPI: [name] is a 77 year old female who returns for follow up appointment for chronic pain and medication refill. She states her pain is located in her lower back and left hip. She rates her pain 8. Her current exercise regime is walking.   [name] is 45.00 MME. She is also prescribed Lorazepam by Lianne Moris. We have discussed the black box warning of using opioids and benzodiazepines. I highlighted the dangers of using these drugs together and discussed the adverse events including respiratory suppression, overdose, cognitive impairment and importance of compliance with current regimen. We will continue to monitor and adjust as indicated.    Pain Inventory Average Pain 8 Pain Right Now 8 My pain is dull and aching  In the last 24 hours, has pain interfered with the following? General activity 5 Relation with others 4 Enjoyment of life 6 What TIME of day is your pain at its worst? morning daytime evening Sleep (in general) Good  Pain is worse with: walking, bending, sitting, standing and some activites Pain improves with: rest, heat/ice, therapy/exercise, pacing activities and medication Relief from Meds: 10  Mobility walk without assistance do you drive?  yes  Function I need assistance with the following:  meal prep, household duties and shopping  Neuro/Psych spasms depression anxiety  Prior Studies Any changes since last visit?  no  Physicians involved in your care Any changes since last visit?  no   No family history on file. Social History   Socioeconomic History  . Marital status: Widowed    Spouse name: Not on file  . Number of children: Not on file  . Years of education: Not on file  . Highest education level: Not on file  Occupational History  . Not on file  Social Needs  . Financial resource strain: Not on file    . Food insecurity:    Worry: Not on file    Inability: Not on file  . Transportation needs:    Medical: Not on file    Non-medical: Not on file  Tobacco Use  . Smoking status: Current Every Day Smoker    Packs/day: 0.50    Types: Cigarettes  . Smokeless tobacco: Never Used  Substance and Sexual Activity  . Alcohol use: No  . Drug use: No  . Sexual activity: Not on file  Lifestyle  . Physical activity:    Days per week: Not on file    Minutes per session: Not on file  . Stress: Not on file  Relationships  . Social connections:    Talks on phone: Not on file    Gets together: Not on file    Attends religious service: Not on file    Active member of club or organization: Not on file    Attends meetings of clubs or organizations: Not on file    Relationship status: Not on file  Other Topics Concern  . Not on file  Social History Narrative  . Not on file   Past Surgical History:  Procedure Laterality Date  . ABDOMINAL HYSTERECTOMY    . CHOLECYSTECTOMY    . TONSILLECTOMY     Past Medical History:  Diagnosis Date  . Anxiety   . Chronic diastolic heart failure (HCC)   . Chronic pain    hips back and shoulders  . COPD (chronic obstructive pulmonary  disease) (HCC)   . Depression   . Hypothyroidism 09/08/2015  . Tachycardia   . Thyroid disease    BP (!) 102/59   Pulse 77   Ht 5\' 6"  (1.676 m)   Wt 132 lb 9.6 oz (60.1 kg)   SpO2 (!) 88% Comment: COPD  BMI 21.40 kg/m   Opioid Risk Score:   Fall Risk Score:  `1  Depression screen PHQ 2/9  [facility] 01/08/2018 08/26/2017 07/31/2017 02/03/2017 12/02/2016 08/20/2016 09/21/2015  Decreased Interest 0 0 1 1 0 1 0  Down, Depressed, Hopeless 0 0 1 1 0 0 0  PHQ - 2 Score 0 0 2 2 0 1 0  Altered sleeping - 0 - - - 0 -  Tired, decreased energy - 0 - - - 0 -  Change in appetite - 0 - - - 0 -  Feeling bad or failure about yourself  - 0 - - - 0 -  Trouble concentrating - 0 - - - 0 -  Moving slowly or  fidgety/restless - 0 - - - 0 -  Suicidal thoughts - 0 - - - 0 -  PHQ-9 Score - 0 - - - 1 -  Difficult doing work/chores - Not difficult at all - - - Somewhat difficult -    Review of Systems  Constitutional: Negative.   HENT: Negative.   Eyes: Negative.   Respiratory: Positive for shortness of breath.   Cardiovascular: Negative.   Gastrointestinal: Positive for constipation and diarrhea.  Endocrine: Negative.   Genitourinary: Negative.   Musculoskeletal:       Spasms  Skin: Negative.   Allergic/Immunologic: Negative.   Neurological: Negative.   Hematological: Negative.   Psychiatric/Behavioral: Positive for dysphoric mood. The patient is nervous/anxious.        Depression well managed by medication  All other systems reviewed and are negative.      Objective:   Physical Exam  Constitutional: She is oriented to person, place, and time. She appears well-developed and well-nourished.  HENT:  Head: Normocephalic.  Neck: Normal range of motion. Neck supple.  Cardiovascular: Normal rate and regular rhythm.  Pulmonary/Chest: Effort normal and breath sounds normal.  Musculoskeletal:  Normal Muscle Bulk and Muscle Testing Reveals: Upper Extremities: Full ROM and Muscle Strength 5/5 Lumbar Paraspinal Tenderness" L-4-L-5 Mainly Left Side Lower Extremities: Full ROM and Muscle Strength 5/5 Arises from Table with Ease Narrow Based Gait  Neurological: She is alert and oriented to person, place, and time.  Skin: Skin is warm and dry.  Psychiatric: She has a normal mood and affect. Her behavior is normal.  Nursing note and vitals reviewed.         Assessment & Plan:  1. 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 01/08/2018 2. OA of Left Knee: Refilled Oxycodone 10/325 mg one tablet every 8 hours as needed #90. 01/08/2018 We will continue the opioid monitoring program, this consists of regular clinic visits,  examinations, urine drug screen, pill counts as well as use of [place]. Continue Voltaren Gel and HEP as tolerated. 3. Muscle Spasms: Continuecurrent medication regimen withFlexeril as needed. 01/08/2018 4. Left Greater Trochanter Bursitis: Continue to Alternate with Heat and Ice Therapy. Continue to Monitor. 01/08/2018.  20 minutes of face to face patient care time was spent during this visit. All questions were encouraged and answered.  F/U in 1 month

## 2018-01-12 DIAGNOSIS — M25562 Pain in left knee: Secondary | ICD-10-CM | POA: Diagnosis not present

## 2018-01-12 DIAGNOSIS — M545 Low back pain: Secondary | ICD-10-CM | POA: Diagnosis not present

## 2018-01-12 DIAGNOSIS — Z6821 Body mass index (BMI) 21.0-21.9, adult: Secondary | ICD-10-CM | POA: Diagnosis not present

## 2018-01-12 DIAGNOSIS — Z0000 Encounter for general adult medical examination without abnormal findings: Secondary | ICD-10-CM | POA: Diagnosis not present

## 2018-01-12 DIAGNOSIS — Z0100 Encounter for examination of eyes and vision without abnormal findings: Secondary | ICD-10-CM | POA: Diagnosis not present

## 2018-01-12 DIAGNOSIS — E782 Mixed hyperlipidemia: Secondary | ICD-10-CM | POA: Diagnosis not present

## 2018-01-12 DIAGNOSIS — F411 Generalized anxiety disorder: Secondary | ICD-10-CM | POA: Diagnosis not present

## 2018-01-12 DIAGNOSIS — I10 Essential (primary) hypertension: Secondary | ICD-10-CM | POA: Diagnosis not present

## 2018-01-17 DIAGNOSIS — J449 Chronic obstructive pulmonary disease, unspecified: Secondary | ICD-10-CM | POA: Diagnosis not present

## 2018-01-17 DIAGNOSIS — I5040 Unspecified combined systolic (congestive) and diastolic (congestive) heart failure: Secondary | ICD-10-CM | POA: Diagnosis not present

## 2018-02-01 ENCOUNTER — Other Ambulatory Visit: Payer: Self-pay

## 2018-02-01 ENCOUNTER — Encounter: Payer: Medicare HMO | Attending: Physical Medicine & Rehabilitation | Admitting: Registered Nurse

## 2018-02-01 ENCOUNTER — Encounter: Payer: Self-pay | Admitting: Registered Nurse

## 2018-02-01 VITALS — BP 93/56 | HR 71 | Ht 66.0 in | Wt 133.2 lb

## 2018-02-01 DIAGNOSIS — Z5181 Encounter for therapeutic drug level monitoring: Secondary | ICD-10-CM

## 2018-02-01 DIAGNOSIS — M1712 Unilateral primary osteoarthritis, left knee: Secondary | ICD-10-CM | POA: Diagnosis not present

## 2018-02-01 DIAGNOSIS — G894 Chronic pain syndrome: Secondary | ICD-10-CM

## 2018-02-01 DIAGNOSIS — F329 Major depressive disorder, single episode, unspecified: Secondary | ICD-10-CM | POA: Diagnosis not present

## 2018-02-01 DIAGNOSIS — M7062 Trochanteric bursitis, left hip: Secondary | ICD-10-CM | POA: Diagnosis not present

## 2018-02-01 DIAGNOSIS — M545 Low back pain: Secondary | ICD-10-CM | POA: Diagnosis not present

## 2018-02-01 DIAGNOSIS — I5032 Chronic diastolic (congestive) heart failure: Secondary | ICD-10-CM | POA: Diagnosis not present

## 2018-02-01 DIAGNOSIS — F419 Anxiety disorder, unspecified: Secondary | ICD-10-CM | POA: Diagnosis not present

## 2018-02-01 DIAGNOSIS — J449 Chronic obstructive pulmonary disease, unspecified: Secondary | ICD-10-CM | POA: Insufficient documentation

## 2018-02-01 DIAGNOSIS — E039 Hypothyroidism, unspecified: Secondary | ICD-10-CM | POA: Diagnosis not present

## 2018-02-01 DIAGNOSIS — M5416 Radiculopathy, lumbar region: Secondary | ICD-10-CM

## 2018-02-01 DIAGNOSIS — Z79899 Other long term (current) drug therapy: Secondary | ICD-10-CM | POA: Diagnosis not present

## 2018-02-01 DIAGNOSIS — F17210 Nicotine dependence, cigarettes, uncomplicated: Secondary | ICD-10-CM | POA: Diagnosis not present

## 2018-02-01 DIAGNOSIS — M47816 Spondylosis without myelopathy or radiculopathy, lumbar region: Secondary | ICD-10-CM | POA: Diagnosis not present

## 2018-02-01 DIAGNOSIS — R000 Tachycardia, unspecified: Secondary | ICD-10-CM | POA: Insufficient documentation

## 2018-02-01 DIAGNOSIS — M5136 Other intervertebral disc degeneration, lumbar region: Secondary | ICD-10-CM | POA: Diagnosis not present

## 2018-02-01 DIAGNOSIS — G8929 Other chronic pain: Secondary | ICD-10-CM | POA: Insufficient documentation

## 2018-02-01 MED ORDER — OXYCODONE-ACETAMINOPHEN 10-325 MG PO TABS: 1.0000 | ORAL_TABLET | Freq: Three times a day (TID) | ORAL | 0 refills | Status: DC | PRN

## 2018-02-01 NOTE — Progress Notes (Signed)
Subjective:    Patient ID: Ashley EconomyPhyllis Jesus, female    DOB: 04/09/1941, 77 y.o.   MRN: [id number]  HPI: Ashley Houston is a 77 year old female who returns for follow up appointment for chronic pain and medication refill. She states her pain is located in her lower back radiating into her left hip and left lower extremity. She rates her pain 8. Her current exercise regime is walking.   Ashley Houston Morphine Equivalent is 45.00 MME. She  is also prescribed Lorazepam by Lianne MorisErin Carroll . We have discussed the black box warning of using opioids and benzodiazepines. I highlighted the dangers of using these drugs together and discussed the adverse events including respiratory suppression, overdose, cognitive impairment and importance of compliance with current regimen. We will continue to monitor and adjust as indicated.   Last Oral Swab was Performed on 09/25/2017, it was consistent.   Pain Inventory Average Pain 8 Pain Right Now 8 My pain is dull and aching  In the last 24 hours, has pain interfered with the following? General activity 6 Relation with others 6 Enjoyment of life 9 What TIME of day is your pain at its worst? all but night Sleep (in general) Good  Pain is worse with: walking, bending, sitting, standing and some activites Pain improves with: rest, heat/ice, therapy/exercise, pacing activities and medication Relief from Meds: 10  Mobility do you drive?  yes  Function I need assistance with the following:  meal prep, household duties and shopping  Neuro/Psych spasms depression anxiety  Prior Studies Any changes since last visit?  no  Physicians involved in your care Any changes since last visit?  no   No family history on file. Social History   Socioeconomic History  . Marital status: Widowed    Spouse name: Not on file  . Number of children: Not on file  . Years of education: Not on file  . Highest education level: Not on file  Occupational History  .  Not on file  Social Needs  . Financial resource strain: Not on file  . Food insecurity:    Worry: Not on file    Inability: Not on file  . Transportation needs:    Medical: Not on file    Non-medical: Not on file  Tobacco Use  . Smoking status: Current Every Day Smoker    Packs/day: 0.50    Types: Cigarettes  . Smokeless tobacco: Never Used  Substance and Sexual Activity  . Alcohol use: No  . Drug use: No  . Sexual activity: Not on file  Lifestyle  . Physical activity:    Days per week: Not on file    Minutes per session: Not on file  . Stress: Not on file  Relationships  . Social connections:    Talks on phone: Not on file    Gets together: Not on file    Attends religious service: Not on file    Active member of club or organization: Not on file    Attends meetings of clubs or organizations: Not on file    Relationship status: Not on file  Other Topics Concern  . Not on file  Social History Narrative  . Not on file   Past Surgical History:  Procedure Laterality Date  . ABDOMINAL HYSTERECTOMY    . CHOLECYSTECTOMY    . TONSILLECTOMY     Past Medical History:  Diagnosis Date  . Anxiety   . Chronic diastolic heart failure (HCC)   .  Chronic pain    hips back and shoulders  . COPD (chronic obstructive pulmonary disease) (HCC)   . Depression   . Hypothyroidism 09/08/2015  . Tachycardia   . Thyroid disease    BP (!) 93/56   Pulse 71   Ht 5\' 6"  (1.676 m)   Wt 133 lb 3.2 oz (60.4 kg)   SpO2 90% Comment: acryllic nails on  BMI 21.50 kg/m   Opioid Risk Score:   Fall Risk Score:  `1  Depression screen PHQ 2/9  Depression screen [facility] 2/9 02/01/2018 01/08/2018 08/26/2017 07/31/2017 02/03/2017 12/02/2016 08/20/2016  Decreased Interest 0 0 0 1 1 0 1  Down, Depressed, Hopeless 0 0 0 1 1 0 0  PHQ - 2 Score 0 0 0 2 2 0 1  Altered sleeping - - 0 - - - 0  Tired, decreased energy - - 0 - - - 0  Change in appetite - - 0 - - - 0  Feeling bad or failure about yourself  - - 0  - - - 0  Trouble concentrating - - 0 - - - 0  Moving slowly or fidgety/restless - - 0 - - - 0  Suicidal thoughts - - 0 - - - 0  PHQ-9 Score - - 0 - - - 1  Difficult doing work/chores - - Not difficult at all - - - Somewhat difficult   Review of Systems  Constitutional: Negative.   HENT: Negative.   Eyes: Negative.   Respiratory: Positive for shortness of breath.   Cardiovascular: Negative.   Gastrointestinal: Positive for constipation and diarrhea.  Endocrine: Negative.   Genitourinary: Negative.   Musculoskeletal: Negative.   Skin: Negative.   Allergic/Immunologic: Negative.   Neurological: Negative.   Hematological: Negative.   Psychiatric/Behavioral: Negative.   All other systems reviewed and are negative.      Objective:   Physical Exam  Constitutional: She is oriented to person, place, and time. She appears well-developed and well-nourished.  HENT:  Head: Normocephalic and atraumatic.  Neck: Normal range of motion. Neck supple.  Cardiovascular: Normal rate and regular rhythm.  Pulmonary/Chest: Effort normal and breath sounds normal.  Musculoskeletal:  Normal Muscle Bulk and Muscle Testing Reveals: Upper Extremities: Full ROM and Muscle Strength 5/5 Lumbar Paraspinal Tenderness: L-4-L-5 Lower Extremities: Full ROM and Muscle Strength 5/5 Arises from Table with ease Narrow Based Gait   Neurological: She is alert and oriented to person, place, and time.  Skin: Skin is warm and dry.  Psychiatric: She has a normal mood and affect. Her behavior is normal.  Nursing note and vitals reviewed.         Assessment & Plan:  1. 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 02/01/2018 2. OA of Left Knee: Refilled Oxycodone 10/325 mg one tablet every 8 hours as needed #90. 02/01/2018 We will continue the opioid monitoring program, this consists of regular clinic visits, examinations, urine drug screen, pill  counts as well as use of [place] Controlled Substance reporting System. Continue Voltaren Gel and HEP as tolerated. 3. Muscle Spasms: Continuecurrent medication regimen withFlexeril as needed. 02/01/2018 4. Left Greater Trochanter Bursitis: Continue to Alternate with Heat and Ice Therapy. Continue to Monitor. 02/01/2018.  20 minutes of face to face patient care time was spent during this visit. All questions were encouraged and answered.  F/U in 1 month

## 2018-02-06 DIAGNOSIS — Z6821 Body mass index (BMI) 21.0-21.9, adult: Secondary | ICD-10-CM | POA: Diagnosis not present

## 2018-02-06 DIAGNOSIS — J0101 Acute recurrent maxillary sinusitis: Secondary | ICD-10-CM | POA: Diagnosis not present

## 2018-02-16 DIAGNOSIS — J449 Chronic obstructive pulmonary disease, unspecified: Secondary | ICD-10-CM | POA: Diagnosis not present

## 2018-02-16 DIAGNOSIS — I5040 Unspecified combined systolic (congestive) and diastolic (congestive) heart failure: Secondary | ICD-10-CM | POA: Diagnosis not present

## 2018-03-01 ENCOUNTER — Encounter: Payer: Medicare HMO | Attending: Physical Medicine & Rehabilitation | Admitting: Physical Medicine & Rehabilitation

## 2018-03-01 ENCOUNTER — Other Ambulatory Visit: Payer: Self-pay

## 2018-03-01 ENCOUNTER — Encounter: Payer: Self-pay | Admitting: Physical Medicine & Rehabilitation

## 2018-03-01 VITALS — BP 120/75 | HR 70 | Ht 66.0 in | Wt 128.8 lb

## 2018-03-01 DIAGNOSIS — M47816 Spondylosis without myelopathy or radiculopathy, lumbar region: Secondary | ICD-10-CM

## 2018-03-01 DIAGNOSIS — G8929 Other chronic pain: Secondary | ICD-10-CM | POA: Insufficient documentation

## 2018-03-01 DIAGNOSIS — I5032 Chronic diastolic (congestive) heart failure: Secondary | ICD-10-CM | POA: Insufficient documentation

## 2018-03-01 DIAGNOSIS — M7062 Trochanteric bursitis, left hip: Secondary | ICD-10-CM | POA: Diagnosis not present

## 2018-03-01 DIAGNOSIS — F329 Major depressive disorder, single episode, unspecified: Secondary | ICD-10-CM | POA: Diagnosis not present

## 2018-03-01 DIAGNOSIS — E039 Hypothyroidism, unspecified: Secondary | ICD-10-CM | POA: Insufficient documentation

## 2018-03-01 DIAGNOSIS — J449 Chronic obstructive pulmonary disease, unspecified: Secondary | ICD-10-CM | POA: Insufficient documentation

## 2018-03-01 DIAGNOSIS — M5416 Radiculopathy, lumbar region: Secondary | ICD-10-CM

## 2018-03-01 DIAGNOSIS — M545 Low back pain: Secondary | ICD-10-CM | POA: Insufficient documentation

## 2018-03-01 DIAGNOSIS — R000 Tachycardia, unspecified: Secondary | ICD-10-CM | POA: Insufficient documentation

## 2018-03-01 DIAGNOSIS — M5136 Other intervertebral disc degeneration, lumbar region: Secondary | ICD-10-CM

## 2018-03-01 DIAGNOSIS — F419 Anxiety disorder, unspecified: Secondary | ICD-10-CM | POA: Diagnosis not present

## 2018-03-01 DIAGNOSIS — M1712 Unilateral primary osteoarthritis, left knee: Secondary | ICD-10-CM

## 2018-03-01 DIAGNOSIS — F17210 Nicotine dependence, cigarettes, uncomplicated: Secondary | ICD-10-CM | POA: Insufficient documentation

## 2018-03-01 MED ORDER — OXYCODONE-ACETAMINOPHEN 10-325 MG PO TABS: 1.0000 | ORAL_TABLET | Freq: Three times a day (TID) | ORAL | 0 refills | Status: DC | PRN

## 2018-03-01 NOTE — Progress Notes (Signed)
Subjective:    Patient ID: [name], female    DOB: 19-Dec-1940, 77 y.o.   MRN: [id number]  HPI   Mrs. Bordley is here in follow-up of her chronic back pain.  I last saw her in April of this year.  She states that her pain levels have been generally stable.  Pain is worst when she first gets up in the morning and usually midday and then later in the day.  The Percocet seems to help keep her pain under control.  She does stay active with her daughter and with activities around the home.  I asked her if she is stretching every day and she typically does stretching every other day.  Pain is in the low back as well as left hip most predominantly.  Pain Inventory Average Pain 8 Pain Right Now 0 My pain is dull and aching  In the last 24 hours, has pain interfered with the following? General activity 0 Relation with others 0 Enjoyment of life 0 What TIME of day is your pain at its worst? morning daytime evening Sleep (in general) Good  Pain is worse with: walking, bending and sitting Pain improves with: therapy/exercise, pacing activities and medication Relief from Meds: 10  Mobility do you drive?  yes  Function I need assistance with the following:  meal prep, household duties and shopping  Neuro/Psych spasms depression anxiety  Prior Studies Any changes since last visit?  no  Physicians involved in your care Any changes since last visit?  no Primary care Dr. Arnette Felts   No family history on file. Social History   Socioeconomic History  . Marital status: Widowed    Spouse name: Not on file  . Number of children: Not on file  . Years of education: Not on file  . Highest education level: Not on file  Occupational History  . Not on file  Social Needs  . Financial resource strain: Not on file  . Food insecurity:    Worry: Not on file    Inability: Not on file  . Transportation needs:    Medical: Not on file    Non-medical: Not on file  Tobacco Use  .  Smoking status: Current Every Day Smoker    Packs/day: 0.50    Types: Cigarettes  . Smokeless tobacco: Never Used  Substance and Sexual Activity  . Alcohol use: No  . Drug use: No  . Sexual activity: Not on file  Lifestyle  . Physical activity:    Days per week: Not on file    Minutes per session: Not on file  . Stress: Not on file  Relationships  . Social connections:    Talks on phone: Not on file    Gets together: Not on file    Attends religious service: Not on file    Active member of club or organization: Not on file    Attends meetings of clubs or organizations: Not on file    Relationship status: Not on file  Other Topics Concern  . Not on file  Social History Narrative  . Not on file   Past Surgical History:  Procedure Laterality Date  . ABDOMINAL HYSTERECTOMY    . CHOLECYSTECTOMY    . TONSILLECTOMY     Past Medical History:  Diagnosis Date  . Anxiety   . Chronic diastolic heart failure (HCC)   . Chronic pain    hips back and shoulders  . COPD (chronic obstructive pulmonary disease) (HCC)   .  Depression   . Hypothyroidism 09/08/2015  . Tachycardia   . Thyroid disease    BP 120/75   Pulse 70   Ht 5\' 6"  (1.676 m)   Wt 128 lb 12.8 oz (58.4 kg)   SpO2 95%   BMI 20.79 kg/m   Opioid Risk Score:   Fall Risk Score:  `1  Depression screen PHQ 2/9  Depression screen [facility] 2/9 03/01/2018 02/01/2018 01/08/2018 08/26/2017 07/31/2017 02/03/2017 12/02/2016  Decreased Interest 0 0 0 0 1 1 0  Down, Depressed, Hopeless 0 0 0 0 1 1 0  PHQ - 2 Score 0 0 0 0 2 2 0  Altered sleeping - - - 0 - - -  Tired, decreased energy - - - 0 - - -  Change in appetite - - - 0 - - -  Feeling bad or failure about yourself  - - - 0 - - -  Trouble concentrating - - - 0 - - -  Moving slowly or fidgety/restless - - - 0 - - -  Suicidal thoughts - - - 0 - - -  PHQ-9 Score - - - 0 - - -  Difficult doing work/chores - - - Not difficult at all - - -    Review of Systems  Constitutional:  Negative.   HENT: Negative.   Eyes: Negative.   Respiratory: Negative.   Cardiovascular: Negative.   Gastrointestinal: Positive for constipation.  Endocrine: Negative.   Genitourinary: Negative.   Musculoskeletal: Negative.   Skin: Negative.   Allergic/Immunologic: Negative.   Neurological: Negative.   Hematological: Negative.   Psychiatric/Behavioral: Negative.   All other systems reviewed and are negative.      Objective:   Physical Exam General: No acute distress HEENT: EOMI, oral membranes moist Cards: reg rate  Chest: normal effort Abdomen: Soft, NT, ND Skin: dry, intact Extremities: no edema Skin:Clean and intact without signs of breakdown Neuro:Pt is cognitively appropriate with normal insight, memory, and awareness. Cranial nerves 2-12 are intact. Sensory exam is normal. Reflexes are 2+ in all 4's. Fine motor coordination is intact. No tremors. Motor function is grossly 5/5.   Musculoskeletal:low back flexion to abou 70 degrees, extension to 10 degrees, rotation to 25 degrees. TTP left PSIS. Right greater troch tight. Facet maneuvers cause some pain. Psych:Pt's affect is appropriate. Pt is cooperative       Assessment & Plan:  1. 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  2. Left greater trochanter bursitis.  Persistent pain 3. OA left knee, ?meniscal injury 4. Tobacco abuse   Plan:  1. Continuevoltaren gel for left knee and left hip--which has been beneficial to her back and knee.            -NO refill needed today            -asked her to reduce meloxicam to qd prn 2. Continueflexeril for back spasms, 10mg  q8 prn. #60.  -continue heating pad.  4.  Reviewed need for ice to both hips, particularly left side.5. Continuepercocet 10/325, one q8 prn, #90.  We will continue the controlled substance monitoring  program, this consists of regular clinic visits, examinations, routine drug screening, pill counts as well as use of [place] Controlled Substance Reporting System. [place] today.   6. HEP was discussed. Needs to do daily.  Provided her trochanteric/hip stretches today. 7. Considering appearance of most recent MRI  ESI might be appropriate. However, she is stabile from a pain and exam standpoint---will continue to monitor for need. NOT At present 8. Smoking cessation was not reviewed today  Greater than 50% of time during this encounter was spent counseling patient/family in regard to hip pain, stretches. .  15 minutes was spent the patient today.  We will see her back in follow-up in about 1 month.

## 2018-03-01 NOTE — Patient Instructions (Signed)
YOU NEED TO STRETCH EVERY DAY!!!   USE ICE TO YOUR LEFT HIP

## 2018-03-10 ENCOUNTER — Ambulatory Visit: Payer: Medicare HMO | Admitting: Physical Medicine & Rehabilitation

## 2018-03-19 DIAGNOSIS — I5040 Unspecified combined systolic (congestive) and diastolic (congestive) heart failure: Secondary | ICD-10-CM | POA: Diagnosis not present

## 2018-03-19 DIAGNOSIS — J449 Chronic obstructive pulmonary disease, unspecified: Secondary | ICD-10-CM | POA: Diagnosis not present

## 2018-04-01 ENCOUNTER — Encounter: Payer: Medicare HMO | Attending: Physical Medicine & Rehabilitation | Admitting: Registered Nurse

## 2018-04-01 ENCOUNTER — Encounter: Payer: Self-pay | Admitting: Registered Nurse

## 2018-04-01 ENCOUNTER — Other Ambulatory Visit: Payer: Self-pay

## 2018-04-01 VITALS — BP 94/57 | HR 70 | Ht 66.0 in | Wt 130.2 lb

## 2018-04-01 DIAGNOSIS — M1712 Unilateral primary osteoarthritis, left knee: Secondary | ICD-10-CM | POA: Diagnosis not present

## 2018-04-01 DIAGNOSIS — M5136 Other intervertebral disc degeneration, lumbar region: Secondary | ICD-10-CM | POA: Diagnosis not present

## 2018-04-01 DIAGNOSIS — F17210 Nicotine dependence, cigarettes, uncomplicated: Secondary | ICD-10-CM | POA: Diagnosis not present

## 2018-04-01 DIAGNOSIS — M7062 Trochanteric bursitis, left hip: Secondary | ICD-10-CM | POA: Diagnosis not present

## 2018-04-01 DIAGNOSIS — G8929 Other chronic pain: Secondary | ICD-10-CM | POA: Insufficient documentation

## 2018-04-01 DIAGNOSIS — F329 Major depressive disorder, single episode, unspecified: Secondary | ICD-10-CM | POA: Diagnosis not present

## 2018-04-01 DIAGNOSIS — I5032 Chronic diastolic (congestive) heart failure: Secondary | ICD-10-CM | POA: Insufficient documentation

## 2018-04-01 DIAGNOSIS — G894 Chronic pain syndrome: Secondary | ICD-10-CM

## 2018-04-01 DIAGNOSIS — Z5181 Encounter for therapeutic drug level monitoring: Secondary | ICD-10-CM

## 2018-04-01 DIAGNOSIS — J449 Chronic obstructive pulmonary disease, unspecified: Secondary | ICD-10-CM | POA: Insufficient documentation

## 2018-04-01 DIAGNOSIS — R000 Tachycardia, unspecified: Secondary | ICD-10-CM | POA: Insufficient documentation

## 2018-04-01 DIAGNOSIS — Z79899 Other long term (current) drug therapy: Secondary | ICD-10-CM | POA: Diagnosis not present

## 2018-04-01 DIAGNOSIS — M47816 Spondylosis without myelopathy or radiculopathy, lumbar region: Secondary | ICD-10-CM

## 2018-04-01 DIAGNOSIS — F419 Anxiety disorder, unspecified: Secondary | ICD-10-CM | POA: Diagnosis not present

## 2018-04-01 DIAGNOSIS — E039 Hypothyroidism, unspecified: Secondary | ICD-10-CM | POA: Diagnosis not present

## 2018-04-01 DIAGNOSIS — M545 Low back pain: Secondary | ICD-10-CM | POA: Diagnosis not present

## 2018-04-01 MED ORDER — OXYCODONE-ACETAMINOPHEN 10-325 MG PO TABS: 1.0000 | ORAL_TABLET | Freq: Three times a day (TID) | ORAL | 0 refills | Status: DC | PRN

## 2018-04-01 NOTE — Progress Notes (Signed)
Subjective:    Patient ID: Ashley Houston, female    DOB: 08/31/1940, 77 y.o.   MRN: [id number]  HPI: [name] is 77 year old female who returns for follow up appointment for chronic pain and medication refill. She states her pain is located in her lower back and left hip. She rates her pain 8. Her current exercise regime is walking and performing stretching exercises.   Ashley Houston is 45.00 MME. She is also prescribed Lorazepam by Lianne Moris PA. We have discussed the black box warning of using opioids and benzodiazepines. I highlighted the dangers of using these drugs together and discussed the adverse events including respiratory suppression, overdose, cognitive impairment and importance of compliance with current regimen. We will continue to monitor and adjust as indicated.   Last Oral Swab was Performed on 09/25/2017, it was consistent.   Pain Inventory Average Pain 8 Pain Right Now 8 My pain is sharp, dull and aching  In the last 24 hours, has pain interfered with the following? General activity 3 Relation with others 0 Enjoyment of life 0 What TIME of day is your pain at its worst? varies Sleep (in general) Good  Pain is worse with: walking, bending, sitting, standing and some activites Pain improves with: rest, heat/ice, pacing activities and medication Relief from Meds: 9  Mobility Do you have any goals in this area?  no  Function I need assistance with the following:  meal prep, household duties and shopping  Neuro/Psych spasms depression anxiety  Prior Studies Any changes since last visit?  no  Physicians involved in your care Any changes since last visit?  no Primary care Arnette Felts   No family history on file. Social History   Socioeconomic History  . Marital status: Widowed    Spouse name: Not on file  . Number of children: Not on file  . Years of education: Not on file  . Highest education level: Not on file    Occupational History  . Not on file  Social Needs  . Financial resource strain: Not on file  . Food insecurity:    Worry: Not on file    Inability: Not on file  . Transportation needs:    Medical: Not on file    Non-medical: Not on file  Tobacco Use  . Smoking status: Current Every Day Smoker    Packs/day: 0.50    Types: Cigarettes  . Smokeless tobacco: Never Used  Substance and Sexual Activity  . Alcohol use: No  . Drug use: No  . Sexual activity: Not on file  Lifestyle  . Physical activity:    Days per week: Not on file    Minutes per session: Not on file  . Stress: Not on file  Relationships  . Social connections:    Talks on phone: Not on file    Gets together: Not on file    Attends religious service: Not on file    Active member of club or organization: Not on file    Attends meetings of clubs or organizations: Not on file    Relationship status: Not on file  Other Topics Concern  . Not on file  Social History Narrative  . Not on file   Past Surgical History:  Procedure Laterality Date  . ABDOMINAL HYSTERECTOMY    . CHOLECYSTECTOMY    . TONSILLECTOMY     Past Medical History:  Diagnosis Date  . Anxiety   . Chronic diastolic heart failure (  HCC)   . Chronic pain    hips back and shoulders  . COPD (chronic obstructive pulmonary disease) (HCC)   . Depression   . Hypothyroidism 09/08/2015  . Tachycardia   . Thyroid disease    BP (!) 94/57   Pulse 70   Ht 5\' 6"  (1.676 m)   Wt 130 lb 3.2 oz (59.1 kg)   BMI 21.01 kg/m   Opioid Risk Score:   Fall Risk Score:  `1  Depression screen PHQ 2/9  [facility] 04/01/2018 03/01/2018 02/01/2018 01/08/2018 08/26/2017 07/31/2017 02/03/2017  Decreased Interest 0 0 0 0 0 1 1  Down, Depressed, Hopeless 0 0 0 0 0 1 1  PHQ - 2 Score 0 0 0 0 0 2 2  Altered sleeping - - - - 0 - -  Tired, decreased energy - - - - 0 - -  Change in appetite - - - - 0 - -  Feeling bad or failure about yourself  - - - - 0 - -   Trouble concentrating - - - - 0 - -  Moving slowly or fidgety/restless - - - - 0 - -  Suicidal thoughts - - - - 0 - -  PHQ-9 Score - - - - 0 - -  Difficult doing work/chores - - - - Not difficult at all - -    Review of Systems  Constitutional: Negative.   HENT: Negative.   Eyes: Negative.   Respiratory: Negative.   Cardiovascular: Negative.   Gastrointestinal: Positive for constipation and diarrhea.  Endocrine: Negative.   Genitourinary: Negative.   Musculoskeletal: Negative.   Skin: Negative.   Allergic/Immunologic: Negative.   Neurological: Negative.   Hematological: Negative.   Psychiatric/Behavioral: Negative.   All other systems reviewed and are negative.      Objective:   Physical Exam  Constitutional: She is oriented to person, place, and time. She appears well-developed and well-nourished.  HENT:  Head: Normocephalic and atraumatic.  Neck: Normal range of motion. Neck supple.  Cardiovascular: Normal rate and regular rhythm.  Pulmonary/Chest: Effort normal and breath sounds normal.  Musculoskeletal:  Normal Muscle Bulk and Muscle Testing Reveals: Upper Extremities: Full ROM and Muscle Strength 5/5 Lumbar Paraspinal Tenderness: L-4-L-5 Left Greater Trochanter Tenderness Lower Extremities: Full ROM and Muscle Strength 5/5 Arises from Table with Ease Narrow Based gait   Neurological: She is alert and oriented to person, place, and time.  Skin: Skin is warm and dry.  Psychiatric: She has a normal mood and affect. Her behavior is normal.  Nursing note and vitals reviewed.         Assessment & Plan:  1. 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 04/01/2018 2. OA of Left Knee: Refilled Oxycodone 10/325 mg one tablet every 8 hours as needed #90. 04/01/2018 We will continue the opioid monitoring program, this consists of regular clinic visits, examinations, urine drug screen, pill counts as well  as [place]. Continue Voltaren Gel and HEP as tolerated. 3. Muscle Spasms: Continuecurrent medication regimen withFlexeril as needed. 04/01/2018 4. Left Greater Trochanter Bursitis: Continue to Alternate with Heat and Ice Therapy. Continue to Monitor. 04/01/2018.  20 minutes of face to face patient care time was spent during this visit. All questions were encouraged and answered.  F/U in 1 month

## 2018-04-18 DIAGNOSIS — I5040 Unspecified combined systolic (congestive) and diastolic (congestive) heart failure: Secondary | ICD-10-CM | POA: Diagnosis not present

## 2018-04-18 DIAGNOSIS — J449 Chronic obstructive pulmonary disease, unspecified: Secondary | ICD-10-CM | POA: Diagnosis not present

## 2018-04-19 ENCOUNTER — Other Ambulatory Visit: Payer: Self-pay | Admitting: Registered Nurse

## 2018-04-19 DIAGNOSIS — M1712 Unilateral primary osteoarthritis, left knee: Secondary | ICD-10-CM

## 2018-04-19 MED ORDER — DICLOFENAC SODIUM 1 % TD GEL: 1.0000 "application " | Freq: Three times a day (TID) | TRANSDERMAL | 4 refills | Status: DC

## 2018-04-29 ENCOUNTER — Encounter: Payer: Medicare HMO | Attending: Physical Medicine & Rehabilitation | Admitting: Registered Nurse

## 2018-04-29 ENCOUNTER — Encounter: Payer: Self-pay | Admitting: Registered Nurse

## 2018-04-29 VITALS — BP 113/64 | HR 78 | Ht 64.0 in | Wt 129.0 lb

## 2018-04-29 DIAGNOSIS — E039 Hypothyroidism, unspecified: Secondary | ICD-10-CM | POA: Insufficient documentation

## 2018-04-29 DIAGNOSIS — Z5181 Encounter for therapeutic drug level monitoring: Secondary | ICD-10-CM

## 2018-04-29 DIAGNOSIS — F17210 Nicotine dependence, cigarettes, uncomplicated: Secondary | ICD-10-CM | POA: Diagnosis not present

## 2018-04-29 DIAGNOSIS — M5416 Radiculopathy, lumbar region: Secondary | ICD-10-CM

## 2018-04-29 DIAGNOSIS — M5136 Other intervertebral disc degeneration, lumbar region: Secondary | ICD-10-CM | POA: Diagnosis not present

## 2018-04-29 DIAGNOSIS — Z79899 Other long term (current) drug therapy: Secondary | ICD-10-CM | POA: Diagnosis not present

## 2018-04-29 DIAGNOSIS — G894 Chronic pain syndrome: Secondary | ICD-10-CM | POA: Diagnosis not present

## 2018-04-29 DIAGNOSIS — M47816 Spondylosis without myelopathy or radiculopathy, lumbar region: Secondary | ICD-10-CM | POA: Diagnosis not present

## 2018-04-29 DIAGNOSIS — R000 Tachycardia, unspecified: Secondary | ICD-10-CM | POA: Diagnosis not present

## 2018-04-29 DIAGNOSIS — F329 Major depressive disorder, single episode, unspecified: Secondary | ICD-10-CM | POA: Insufficient documentation

## 2018-04-29 DIAGNOSIS — F419 Anxiety disorder, unspecified: Secondary | ICD-10-CM | POA: Diagnosis not present

## 2018-04-29 DIAGNOSIS — M1712 Unilateral primary osteoarthritis, left knee: Secondary | ICD-10-CM | POA: Diagnosis not present

## 2018-04-29 DIAGNOSIS — M545 Low back pain: Secondary | ICD-10-CM | POA: Diagnosis not present

## 2018-04-29 DIAGNOSIS — G8929 Other chronic pain: Secondary | ICD-10-CM | POA: Insufficient documentation

## 2018-04-29 DIAGNOSIS — J449 Chronic obstructive pulmonary disease, unspecified: Secondary | ICD-10-CM | POA: Insufficient documentation

## 2018-04-29 DIAGNOSIS — M7062 Trochanteric bursitis, left hip: Secondary | ICD-10-CM

## 2018-04-29 DIAGNOSIS — I5032 Chronic diastolic (congestive) heart failure: Secondary | ICD-10-CM | POA: Insufficient documentation

## 2018-04-29 MED ORDER — OXYCODONE-ACETAMINOPHEN 10-325 MG PO TABS: 1.0000 | ORAL_TABLET | Freq: Three times a day (TID) | ORAL | 0 refills | Status: DC | PRN

## 2018-04-29 NOTE — Progress Notes (Signed)
Subjective:    Patient ID: Ashley EconomyPhyllis Kathman, female    DOB: 06/04/1940, 77 y.o.   MRN: [id number]  HPI: Ashley Houston is a 77 y.o. female who returns for follow up appointment for chronic pain and medication refill. She states her pain is located in her lower back and left hip.She rates her pain 1. Her current exercise regime is walking and performing stretching exercises.   [name] Morphine equivalent is 45.00   MME. She is also prescribed Lorazepam  by Lianne MorisErin Carroll Pa-C .We have discussed the black box warning of using opioids and benzodiazepines. I highlighted the dangers of using these drugs together and discussed the adverse events including respiratory suppression, overdose, cognitive impairment and importance of compliance with current regimen. We will continue to monitor and adjust as indicated.   Last Oral Swab was Performed on 09/25/2017, it was consistent.   Pain Inventory Average Pain 8 Pain Right Now 1 My pain is dull and aching  In the last 24 hours, has pain interfered with the following? General activity 2 Relation with others 2 Enjoyment of life 1 What TIME of day is your pain at its worst? morning, daytime, evening Sleep (in general) Good  Pain is worse with: walking, bending, sitting and standing Pain improves with: rest, heat/ice, therapy/exercise and medication Relief from Meds: 10  Mobility walk without assistance do you drive?  yes  Function I need assistance with the following:  meal prep, household duties and shopping  Neuro/Psych spasms depression anxiety  Prior Studies Any changes since last visit?  no  Physicians involved in your care Any changes since last visit?  no   History reviewed. No pertinent family history. Social History   Socioeconomic History  . Marital status: Widowed    Spouse name: Not on file  . Number of children: Not on file  . Years of education: Not on file  . Highest education level: Not on file    Occupational History  . Not on file  Social Needs  . Financial resource strain: Not on file  . Food insecurity:    Worry: Not on file    Inability: Not on file  . Transportation needs:    Medical: Not on file    Non-medical: Not on file  Tobacco Use  . Smoking status: Current Every Day Smoker    Packs/day: 0.50    Types: Cigarettes  . Smokeless tobacco: Never Used  Substance and Sexual Activity  . Alcohol use: No  . Drug use: No  . Sexual activity: Not on file  Lifestyle  . Physical activity:    Days per week: Not on file    Minutes per session: Not on file  . Stress: Not on file  Relationships  . Social connections:    Talks on phone: Not on file    Gets together: Not on file    Attends religious service: Not on file    Active member of club or organization: Not on file    Attends meetings of clubs or organizations: Not on file    Relationship status: Not on file  Other Topics Concern  . Not on file  Social History Narrative  . Not on file   Past Surgical History:  Procedure Laterality Date  . ABDOMINAL HYSTERECTOMY    . CHOLECYSTECTOMY    . TONSILLECTOMY     Past Medical History:  Diagnosis Date  . Anxiety   . Chronic diastolic heart failure (HCC)   . Chronic pain  hips back and shoulders  . COPD (chronic obstructive pulmonary disease) (HCC)   . Depression   . Hypothyroidism 09/08/2015  . Tachycardia   . Thyroid disease    BP 113/64   Pulse 78   Ht 5\' 4"  (1.626 m)   Wt 129 lb (58.5 kg)   SpO2 (!) 89%   BMI 22.14 kg/m   Opioid Risk Score:   Fall Risk Score:  `1  Depression screen PHQ 2/9  Depression screen [facility] 2/9 04/01/2018 03/01/2018 02/01/2018 01/08/2018 08/26/2017 07/31/2017 02/03/2017  Decreased Interest 0 0 0 0 0 1 1  Down, Depressed, Hopeless 0 0 0 0 0 1 1  PHQ - 2 Score 0 0 0 0 0 2 2  Altered sleeping - - - - 0 - -  Tired, decreased energy - - - - 0 - -  Change in appetite - - - - 0 - -  Feeling bad or failure about yourself  - - - - 0  - -  Trouble concentrating - - - - 0 - -  Moving slowly or fidgety/restless - - - - 0 - -  Suicidal thoughts - - - - 0 - -  PHQ-9 Score - - - - 0 - -  Difficult doing work/chores - - - - Not difficult at all - -    Review of Systems  Constitutional: Negative.   HENT: Negative.   Eyes: Negative.   Respiratory: Negative.   Gastrointestinal: Positive for constipation and diarrhea.  Endocrine: Negative.   Genitourinary: Negative.   Musculoskeletal: Positive for arthralgias and back pain.       Spasms   Skin: Negative.   Allergic/Immunologic: Negative.   Neurological: Negative.   Hematological: Negative.   Psychiatric/Behavioral: Positive for dysphoric mood. The patient is nervous/anxious.   All other systems reviewed and are negative.      Objective:   Physical Exam Vitals signs and nursing note reviewed.  Constitutional:      Appearance: Normal appearance.  Neck:     Musculoskeletal: Normal range of motion and neck supple.  Cardiovascular:     Rate and Rhythm: Normal rate and regular rhythm.     Pulses: Normal pulses.     Heart sounds: Normal heart sounds.  Pulmonary:     Effort: Pulmonary effort is normal.     Breath sounds: Normal breath sounds.  Musculoskeletal:     Comments: Normal Muscle Bulk and Muscle Testing Reveals:  Upper Extremities: Full ROM and Muscle Strength 5/5 Lumbar Paraspinal Tenderness: L-4-L-5 Left Greater Trochanter Tenderness Lower Extremities: Full ROM and Muscle Strength 5/5 Arises from Table with ease Narrow Based  Gait   Skin:    General: Skin is warm and dry.  Neurological:     Mental Status: She is alert and oriented to person, place, and time.  Psychiatric:        Mood and Affect: Mood normal.        Behavior: Behavior normal.           Assessment & Plan:  1. 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 04/29/2018 2. OA of Left Knee: Refilled Oxycodone 10/325 mg  one tablet every 8 hours as needed #90. 04/29/2018 We will continue the opioid monitoring program, this consists of regular clinic visits, examinations, urine drug screen, pill counts as well as use of [place] Controlled Substance reporting System. Continue Voltaren Gel and HEP as tolerated. 3. Muscle Spasms: Continuecurrent  medication regimen withFlexeril as needed. 04/29/2018 4. Left Greater Trochanter Bursitis: Continue to Alternate with Heat and Ice Therapy. Continue to Monitor. 04/29/2018.  20 minutes of face to face patient care time was spent during this visit. All questions were encouraged and answered.  F/U in 1 month

## 2018-05-19 DIAGNOSIS — J449 Chronic obstructive pulmonary disease, unspecified: Secondary | ICD-10-CM | POA: Diagnosis not present

## 2018-05-19 DIAGNOSIS — I5040 Unspecified combined systolic (congestive) and diastolic (congestive) heart failure: Secondary | ICD-10-CM | POA: Diagnosis not present

## 2018-05-20 DIAGNOSIS — J209 Acute bronchitis, unspecified: Secondary | ICD-10-CM | POA: Diagnosis not present

## 2018-05-20 DIAGNOSIS — J0101 Acute recurrent maxillary sinusitis: Secondary | ICD-10-CM | POA: Diagnosis not present

## 2018-05-20 DIAGNOSIS — Z6821 Body mass index (BMI) 21.0-21.9, adult: Secondary | ICD-10-CM | POA: Diagnosis not present

## 2018-06-03 ENCOUNTER — Encounter: Payer: Self-pay | Admitting: Registered Nurse

## 2018-06-03 ENCOUNTER — Encounter: Payer: Medicare HMO | Attending: Physical Medicine & Rehabilitation | Admitting: Registered Nurse

## 2018-06-03 ENCOUNTER — Other Ambulatory Visit: Payer: Self-pay

## 2018-06-03 VITALS — BP 100/66 | HR 77 | Ht 66.0 in | Wt 129.2 lb

## 2018-06-03 DIAGNOSIS — J449 Chronic obstructive pulmonary disease, unspecified: Secondary | ICD-10-CM | POA: Diagnosis not present

## 2018-06-03 DIAGNOSIS — Z79899 Other long term (current) drug therapy: Secondary | ICD-10-CM

## 2018-06-03 DIAGNOSIS — M545 Low back pain: Secondary | ICD-10-CM | POA: Insufficient documentation

## 2018-06-03 DIAGNOSIS — F17210 Nicotine dependence, cigarettes, uncomplicated: Secondary | ICD-10-CM | POA: Insufficient documentation

## 2018-06-03 DIAGNOSIS — G894 Chronic pain syndrome: Secondary | ICD-10-CM | POA: Diagnosis not present

## 2018-06-03 DIAGNOSIS — G8929 Other chronic pain: Secondary | ICD-10-CM | POA: Insufficient documentation

## 2018-06-03 DIAGNOSIS — M5416 Radiculopathy, lumbar region: Secondary | ICD-10-CM | POA: Diagnosis not present

## 2018-06-03 DIAGNOSIS — F329 Major depressive disorder, single episode, unspecified: Secondary | ICD-10-CM | POA: Insufficient documentation

## 2018-06-03 DIAGNOSIS — I5032 Chronic diastolic (congestive) heart failure: Secondary | ICD-10-CM | POA: Diagnosis not present

## 2018-06-03 DIAGNOSIS — M47816 Spondylosis without myelopathy or radiculopathy, lumbar region: Secondary | ICD-10-CM

## 2018-06-03 DIAGNOSIS — F419 Anxiety disorder, unspecified: Secondary | ICD-10-CM | POA: Diagnosis not present

## 2018-06-03 DIAGNOSIS — R000 Tachycardia, unspecified: Secondary | ICD-10-CM | POA: Insufficient documentation

## 2018-06-03 DIAGNOSIS — Z5181 Encounter for therapeutic drug level monitoring: Secondary | ICD-10-CM

## 2018-06-03 DIAGNOSIS — E039 Hypothyroidism, unspecified: Secondary | ICD-10-CM | POA: Diagnosis not present

## 2018-06-03 DIAGNOSIS — M5136 Other intervertebral disc degeneration, lumbar region: Secondary | ICD-10-CM

## 2018-06-03 DIAGNOSIS — M7062 Trochanteric bursitis, left hip: Secondary | ICD-10-CM | POA: Diagnosis not present

## 2018-06-03 DIAGNOSIS — M1712 Unilateral primary osteoarthritis, left knee: Secondary | ICD-10-CM | POA: Diagnosis not present

## 2018-06-03 MED ORDER — OXYCODONE-ACETAMINOPHEN 10-325 MG PO TABS: 1.0000 | ORAL_TABLET | Freq: Three times a day (TID) | ORAL | 0 refills | Status: DC | PRN

## 2018-06-03 NOTE — Progress Notes (Signed)
Subjective:    Patient ID: Ashley Houston, female    DOB: [birth year], 78 y.o.   MRN: [id number]  HPI: Ashley Houston is a 78 y.o. female who returns for follow up appointment for chronic pain and medication refill. She states her pain is located in her lower back radiating into her left hip and left lower extremity. She rates her pain 8. Her current exercise regime is walking.  Ashley Houston Morphine equivalent is 45.00 MME. She is also prescribed Lorazepam by Ashley Moris PA .We have discussed the black box warning of using opioids and benzodiazepines. I highlighted the dangers of using these drugs together and discussed the adverse events including respiratory suppression, overdose, cognitive impairment and importance of compliance with current regimen. We will continue to monitor and adjust as indicated.   Last Oral Swab was Performed on 09/25/2017, it was consistent.   Pain Inventory Average Pain 8 Pain Right Now 8 My pain is sharp, dull and aching  In the last 24 hours, has pain interfered with the following? General activity 1 Relation with others 4 Enjoyment of life 1 What TIME of day is your pain at its worst? morning daytime evening Sleep (in general) Good  Pain is worse with: walking, bending, sitting, standing and some activites Pain improves with: rest, heat/ice, therapy/exercise, pacing activities and medication Relief from Meds: 10  Mobility do you drive?  yes  Function I need assistance with the following:  meal prep, household duties and shopping  Neuro/Psych spasms depression anxiety  Prior Studies Any changes since last visit?  no  Physicians involved in your care Any changes since last visit?  no Primary care Dr. Arnette Felts   No family history on file. Social History   Socioeconomic History  . Marital status: Widowed    Spouse name: Not on file  . Number of children: Not on file  . Years of education: Not on file  . Highest education level:  Not on file  Occupational History  . Not on file  Social Needs  . Financial resource strain: Not on file  . Food insecurity:    Worry: Not on file    Inability: Not on file  . Transportation needs:    Medical: Not on file    Non-medical: Not on file  Tobacco Use  . Smoking status: Current Every Day Smoker    Packs/day: 0.50    Types: Cigarettes  . Smokeless tobacco: Never Used  Substance and Sexual Activity  . Alcohol use: No  . Drug use: No  . Sexual activity: Not on file  Lifestyle  . Physical activity:    Days per week: Not on file    Minutes per session: Not on file  . Stress: Not on file  Relationships  . Social connections:    Talks on phone: Not on file    Gets together: Not on file    Attends religious service: Not on file    Active member of club or organization: Not on file    Attends meetings of clubs or organizations: Not on file    Relationship status: Not on file  Other Topics Concern  . Not on file  Social History Narrative  . Not on file   Past Surgical History:  Procedure Laterality Date  . ABDOMINAL HYSTERECTOMY    . CHOLECYSTECTOMY    . TONSILLECTOMY     Past Medical History:  Diagnosis Date  . Anxiety   . Chronic diastolic heart failure (HCC)   .  Chronic pain    hips back and shoulders  . COPD (chronic obstructive pulmonary disease) (HCC)   . Depression   . Hypothyroidism 09/08/2015  . Tachycardia   . Thyroid disease    BP 100/66   Pulse 77   Ht 5\' 6"  (1.676 m)   Wt 129 lb 3.2 oz (58.6 kg)   SpO2 92%   BMI 20.85 kg/m   Opioid Risk Score:   Fall Risk Score:  `1  Depression screen PHQ 2/9  [facility] 2/[facility] 04/01/2018 03/01/2018 02/01/2018 01/08/2018 08/26/2017 07/31/2017 02/03/2017  Decreased Interest 0 0 0 0 0 1 1  Down, Depressed, Hopeless 0 0 0 0 0 1 1  PHQ - 2 Score 0 0 0 0 0 2 2  Altered sleeping - - - - 0 - -  Tired, decreased energy - - - - 0 - -  Change in appetite - - - - 0 - -  Feeling bad or failure about  yourself  - - - - 0 - -  Trouble concentrating - - - - 0 - -  Moving slowly or fidgety/restless - - - - 0 - -  Suicidal thoughts - - - - 0 - -  PHQ-9 Score - - - - 0 - -  Difficult doing work/chores - - - - Not difficult at all - -   Review of Systems  Constitutional: Negative.   HENT: Negative.   Eyes: Negative.   Respiratory: Positive for shortness of breath.   Cardiovascular: Negative.   Gastrointestinal: Positive for constipation and diarrhea.  Endocrine: Negative.   Genitourinary: Negative.   Musculoskeletal: Negative.   Skin: Negative.   Allergic/Immunologic: Negative.   Neurological: Negative.   Hematological: Negative.   Psychiatric/Behavioral: Negative.   All other systems reviewed and are negative.      Objective:   Physical Exam Vitals signs and nursing note reviewed.  Constitutional:      Appearance: Normal appearance.  Neck:     Musculoskeletal: Normal range of motion and neck supple.  Cardiovascular:     Rate and Rhythm: Normal rate and regular rhythm.     Pulses: Normal pulses.     Heart sounds: Normal heart sounds.  Pulmonary:     Effort: Pulmonary effort is normal.     Breath sounds: Normal breath sounds.  Musculoskeletal:     Comments: Normal Muscle Bulk and Muscle Testing Reveals:  Upper Extremities: Full ROM and Muscle Strength 5/5 , Lumbar Paraspinal Tenderness: L-4-L-5 Mainly Left Side Lower Extremities: Full ROM and Muscle Strength 5/5 Arises from Table with ease Narrow Based Gait   Skin:    General: Skin is warm and dry.  Neurological:     Mental Status: She is alert and oriented to person, place, and time.  Psychiatric:        Mood and Affect: Mood normal.        Behavior: Behavior normal.           Assessment & Plan:  1. 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 OA of Left Knee: Refilled Oxycodone 10/325 mg one tablet every 8 hours as needed  #90.06/03/2018 We will continue the opioid monitoring program, this consists of regular clinic visits, examinations, urine drug screen, pill counts as well as use of [place] Cont[place]. Continue Voltaren Gel and HEP as tolerated. 3. Muscle Spasms: Continuecurrent medication regimen withFlexeril as needed.06/03/2018 4. Left Greater Trochanter  Bursitis: Continue to Alternate with Heat and Ice Therapy. Continue to Monitor. 06/03/2018  20 minutes of face to face patient care time was spent during this visit. All questions were encouraged and answered.  F/U in 1 month

## 2018-06-19 DIAGNOSIS — I5040 Unspecified combined systolic (congestive) and diastolic (congestive) heart failure: Secondary | ICD-10-CM | POA: Diagnosis not present

## 2018-06-19 DIAGNOSIS — J449 Chronic obstructive pulmonary disease, unspecified: Secondary | ICD-10-CM | POA: Diagnosis not present

## 2018-07-06 ENCOUNTER — Encounter: Payer: Medicare HMO | Admitting: Registered Nurse

## 2018-07-08 ENCOUNTER — Encounter: Payer: Self-pay | Admitting: Registered Nurse

## 2018-07-08 ENCOUNTER — Encounter: Payer: Medicare HMO | Attending: Physical Medicine & Rehabilitation | Admitting: Registered Nurse

## 2018-07-08 VITALS — BP 101/64 | HR 73 | Ht 66.0 in | Wt 127.0 lb

## 2018-07-08 DIAGNOSIS — I5032 Chronic diastolic (congestive) heart failure: Secondary | ICD-10-CM | POA: Diagnosis not present

## 2018-07-08 DIAGNOSIS — M5416 Radiculopathy, lumbar region: Secondary | ICD-10-CM | POA: Diagnosis not present

## 2018-07-08 DIAGNOSIS — G894 Chronic pain syndrome: Secondary | ICD-10-CM

## 2018-07-08 DIAGNOSIS — M7062 Trochanteric bursitis, left hip: Secondary | ICD-10-CM | POA: Diagnosis not present

## 2018-07-08 DIAGNOSIS — E039 Hypothyroidism, unspecified: Secondary | ICD-10-CM | POA: Insufficient documentation

## 2018-07-08 DIAGNOSIS — M47816 Spondylosis without myelopathy or radiculopathy, lumbar region: Secondary | ICD-10-CM | POA: Diagnosis not present

## 2018-07-08 DIAGNOSIS — M545 Low back pain: Secondary | ICD-10-CM | POA: Diagnosis not present

## 2018-07-08 DIAGNOSIS — F329 Major depressive disorder, single episode, unspecified: Secondary | ICD-10-CM | POA: Diagnosis not present

## 2018-07-08 DIAGNOSIS — G8929 Other chronic pain: Secondary | ICD-10-CM | POA: Insufficient documentation

## 2018-07-08 DIAGNOSIS — Z79899 Other long term (current) drug therapy: Secondary | ICD-10-CM

## 2018-07-08 DIAGNOSIS — M1712 Unilateral primary osteoarthritis, left knee: Secondary | ICD-10-CM | POA: Insufficient documentation

## 2018-07-08 DIAGNOSIS — F17210 Nicotine dependence, cigarettes, uncomplicated: Secondary | ICD-10-CM | POA: Diagnosis not present

## 2018-07-08 DIAGNOSIS — R000 Tachycardia, unspecified: Secondary | ICD-10-CM | POA: Insufficient documentation

## 2018-07-08 DIAGNOSIS — J449 Chronic obstructive pulmonary disease, unspecified: Secondary | ICD-10-CM | POA: Insufficient documentation

## 2018-07-08 DIAGNOSIS — F419 Anxiety disorder, unspecified: Secondary | ICD-10-CM | POA: Insufficient documentation

## 2018-07-08 DIAGNOSIS — Z5181 Encounter for therapeutic drug level monitoring: Secondary | ICD-10-CM | POA: Diagnosis not present

## 2018-07-08 MED ORDER — OXYCODONE-ACETAMINOPHEN 10-325 MG PO TABS: 1.0000 | ORAL_TABLET | Freq: Three times a day (TID) | ORAL | 0 refills | Status: DC | PRN

## 2018-07-08 NOTE — Progress Notes (Signed)
Subjective:    Patient ID: Ashley Houston, female    DOB: 1940-05-28, 78 y.o.   MRN: [id number]  HPI: Ashley Houston is a 78 y.o. female who returns for follow up appointment for chronic pain and medication refill. She states her pain is located in her lower back radiating into her left hip. She rates her pain right now 0.. Her current exercise regime is walking.  Ashley Houston Morphine equivalent is 45.00  MME. She  is also prescribed Lorazepam  by Ashley Houston .We have discussed the black box warning of using opioids and benzodiazepines. I highlighted the dangers of using these drugs together and discussed the adverse events including respiratory suppression, overdose, cognitive impairment and importance of compliance with current regimen. We will continue to monitor and adjust as indicated.   Last Orals Swab was Performed on 09/25/2017, it was consistent. Oral Swab Performed today.    Pain Inventory Average Pain 8 Pain Right Now 0 My pain is dull and aching  In the last 24 hours, has pain interfered with the following? General activity 0 Relation with others 4 Enjoyment of life 3 What TIME of day is your pain at its worst? night Sleep (in general) Good  Pain is worse with: walking, bending, sitting and standing Pain improves with: rest, heat/ice, therapy/exercise, pacing activities and medication Relief from Meds: 10  Mobility walk without assistance do you drive?  yes  Function retired  Neuro/Psych spasms depression anxiety  Prior Studies Any changes since last visit?  no  Physicians involved in your care Any changes since last visit?  no   No family history on file. Social History   Socioeconomic History  . Marital status: Widowed    Spouse name: Not on file  . Number of children: Not on file  . Years of education: Not on file  . Highest education level: Not on file  Occupational History  . Not on file  Social Needs  . Financial resource strain: Not  on file  . Food insecurity:    Worry: Not on file    Inability: Not on file  . Transportation needs:    Medical: Not on file    Non-medical: Not on file  Tobacco Use  . Smoking status: Current Every Day Smoker    Packs/day: 0.50    Types: Cigarettes  . Smokeless tobacco: Never Used  Substance and Sexual Activity  . Alcohol use: No  . Drug use: No  . Sexual activity: Not on file  Lifestyle  . Physical activity:    Days per week: Not on file    Minutes per session: Not on file  . Stress: Not on file  Relationships  . Social connections:    Talks on phone: Not on file    Gets together: Not on file    Attends religious service: Not on file    Active member of club or organization: Not on file    Attends meetings of clubs or organizations: Not on file    Relationship status: Not on file  Other Topics Concern  . Not on file  Social History Narrative  . Not on file   Past Surgical History:  Procedure Laterality Date  . ABDOMINAL HYSTERECTOMY    . CHOLECYSTECTOMY    . TONSILLECTOMY     Past Medical History:  Diagnosis Date  . Anxiety   . Chronic diastolic heart failure (HCC)   . Chronic pain    hips back and shoulders  . COPD (  chronic obstructive pulmonary disease) (HCC)   . Depression   . Hypothyroidism 09/08/2015  . Tachycardia   . Thyroid disease    BP 101/64   Pulse 73   Ht 5\' 6"  (1.676 m)   Wt 127 lb (57.6 kg)   SpO2 93%   BMI 20.50 kg/m   Opioid Risk Score:   Fall Risk Score:  `1  Depression screen PHQ 2/9  Depression screen [facility] 2/9 04/01/2018 03/01/2018 02/01/2018 01/08/2018 08/26/2017 07/31/2017 02/03/2017  Decreased Interest 0 0 0 0 0 1 1  Down, Depressed, Hopeless 0 0 0 0 0 1 1  PHQ - 2 Score 0 0 0 0 0 2 2  Altered sleeping - - - - 0 - -  Tired, decreased energy - - - - 0 - -  Change in appetite - - - - 0 - -  Feeling bad or failure about yourself  - - - - 0 - -  Trouble concentrating - - - - 0 - -  Moving slowly or fidgety/restless - - - - 0 - -    Suicidal thoughts - - - - 0 - -  PHQ-9 Score - - - - 0 - -  Difficult doing work/chores - - - - Not difficult at all - -     Review of Systems  Constitutional: Negative.   HENT: Negative.   Eyes: Negative.   Respiratory: Negative.   Cardiovascular: Negative.   Gastrointestinal: Positive for constipation.  Endocrine: Negative.   Genitourinary: Negative.   Musculoskeletal: Positive for arthralgias, back pain and myalgias.  Skin: Negative.   Allergic/Immunologic: Negative.   Neurological: Negative.   Hematological: Negative.   Psychiatric/Behavioral: Positive for dysphoric mood. The patient is nervous/anxious.   All other systems reviewed and are negative.      Objective:   Physical Exam Vitals signs and nursing note reviewed.  Constitutional:      Appearance: Normal appearance.  Neck:     Musculoskeletal: Normal range of motion and neck supple.  Cardiovascular:     Rate and Rhythm: Normal rate and regular rhythm.     Pulses: Normal pulses.     Heart sounds: Normal heart sounds.  Pulmonary:     Effort: Pulmonary effort is normal.     Breath sounds: Normal breath sounds.  Musculoskeletal:     Comments: Normal Muscle Bulk and Muscle Testing Reveals:  Upper Extremities: Full ROM and Muscle Strength 5/5  Lumbar Paraspinal Tenderness: L-4-L-5 Mainly Left Side Lower Extremities: Full ROM and Muscle Strength 5/5 Arises from Table with Ease Narrow Based Gait  Skin:    General: Skin is warm and dry.  Neurological:     Mental Status: She is alert and oriented to person, place, and time.  Psychiatric:        Mood and Affect: Mood normal.        Behavior: Behavior normal.           Assessment & Plan:  1. 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 OA of Left Knee: Refilled Oxycodone 10/325 mg one tablet every 8 hours as needed #90.07/08/2018 We will continue the opioid monitoring program,  this consists of regular clinic visits, examinations, urine drug screen, pill counts as well as use of [place] Controlled Substance reporting System. Continue Voltaren Gel and HEP as tolerated. 3. Muscle Spasms: Continuecurrent medication regimen withFlexeril as needed.07/08/2018 4. Left Greater Trochanter Bursitis: Continue to Alternate with Heat  and Ice Therapy. Continue to Monitor. 07/08/2018  20 minutes of face to face patient care time was spent during this visit. All questions were encouraged and answered.  F/U in 1 month

## 2018-07-09 DIAGNOSIS — M25562 Pain in left knee: Secondary | ICD-10-CM | POA: Diagnosis not present

## 2018-07-09 DIAGNOSIS — Z6821 Body mass index (BMI) 21.0-21.9, adult: Secondary | ICD-10-CM | POA: Diagnosis not present

## 2018-07-09 DIAGNOSIS — M545 Low back pain: Secondary | ICD-10-CM | POA: Diagnosis not present

## 2018-07-09 DIAGNOSIS — F411 Generalized anxiety disorder: Secondary | ICD-10-CM | POA: Diagnosis not present

## 2018-07-09 DIAGNOSIS — I10 Essential (primary) hypertension: Secondary | ICD-10-CM | POA: Diagnosis not present

## 2018-07-09 DIAGNOSIS — E782 Mixed hyperlipidemia: Secondary | ICD-10-CM | POA: Diagnosis not present

## 2018-07-12 ENCOUNTER — Telehealth: Payer: Self-pay | Admitting: *Deleted

## 2018-07-12 LAB — DRUG TOX MONITOR 1 W/CONF, ORAL FLD
ALPRAZOLAM: NEGATIVE ng/mL (ref <0.50)
Amphetamines: NEGATIVE ng/mL (ref <10)
BARBITURATES: NEGATIVE ng/mL (ref <10)
BENZODIAZEPINES: POSITIVE ng/mL — AB (ref <0.50)
Buprenorphine: NEGATIVE ng/mL (ref <0.10)
CODEINE: NEGATIVE ng/mL (ref <2.5)
COTININE: 102.8 ng/mL — AB (ref <5.0)
Chlordiazepoxide: NEGATIVE ng/mL (ref <0.50)
Clonazepam: NEGATIVE ng/mL (ref <0.50)
Cocaine: NEGATIVE ng/mL (ref <5.0)
DIHYDROCODEINE: NEGATIVE ng/mL (ref <2.5)
Diazepam: NEGATIVE ng/mL (ref <0.50)
FENTANYL: NEGATIVE ng/mL (ref <0.10)
Flunitrazepam: NEGATIVE ng/mL (ref <0.50)
Flurazepam: NEGATIVE ng/mL (ref <0.50)
HEROIN METABOLITE: NEGATIVE ng/mL (ref <1.0)
HYDROCODONE: NEGATIVE ng/mL (ref <2.5)
Hydromorphone: NEGATIVE ng/mL (ref <2.5)
Lorazepam: 3.32 ng/mL — ABNORMAL HIGH (ref <0.50)
MARIJUANA: NEGATIVE ng/mL (ref <2.5)
MDMA: NEGATIVE ng/mL (ref <10)
Meprobamate: NEGATIVE ng/mL (ref <2.5)
Methadone: NEGATIVE ng/mL (ref <5.0)
Midazolam: NEGATIVE ng/mL (ref <0.50)
Morphine: NEGATIVE ng/mL (ref <2.5)
NORDIAZEPAM: NEGATIVE ng/mL (ref <0.50)
Nicotine Metabolite: POSITIVE ng/mL — AB (ref <5.0)
Norhydrocodone: NEGATIVE ng/mL (ref <2.5)
Noroxycodone: 61 ng/mL — ABNORMAL HIGH (ref <2.5)
OXYMORPHONE: NEGATIVE ng/mL (ref <2.5)
Opiates: POSITIVE ng/mL — AB (ref <2.5)
Oxazepam: NEGATIVE ng/mL (ref <0.50)
Oxycodone: 155.3 ng/mL — ABNORMAL HIGH (ref <2.5)
PHENCYCLIDINE: NEGATIVE ng/mL (ref <10)
TAPENTADOL: NEGATIVE ng/mL (ref <5.0)
TRAMADOL: NEGATIVE ng/mL (ref <5.0)
Temazepam: NEGATIVE ng/mL (ref <0.50)
Triazolam: NEGATIVE ng/mL (ref <0.50)
Zolpidem: NEGATIVE ng/mL (ref <5.0)

## 2018-07-12 LAB — DRUG TOX ALC METAB W/CON, ORAL FLD: ALCOHOL METABOLITE: NEGATIVE ng/mL (ref <25)

## 2018-07-12 NOTE — Telephone Encounter (Signed)
Oral swab drug screen was consistent for prescribed medications.  ?

## 2018-07-18 DIAGNOSIS — J449 Chronic obstructive pulmonary disease, unspecified: Secondary | ICD-10-CM | POA: Diagnosis not present

## 2018-07-18 DIAGNOSIS — I5040 Unspecified combined systolic (congestive) and diastolic (congestive) heart failure: Secondary | ICD-10-CM | POA: Diagnosis not present

## 2018-08-02 ENCOUNTER — Telehealth: Payer: Self-pay

## 2018-08-02 ENCOUNTER — Other Ambulatory Visit: Payer: Self-pay

## 2018-08-02 ENCOUNTER — Encounter: Payer: Medicare HMO | Attending: Physical Medicine & Rehabilitation | Admitting: Registered Nurse

## 2018-08-02 ENCOUNTER — Telehealth: Payer: Self-pay | Admitting: Registered Nurse

## 2018-08-02 DIAGNOSIS — G8929 Other chronic pain: Secondary | ICD-10-CM | POA: Insufficient documentation

## 2018-08-02 DIAGNOSIS — M7062 Trochanteric bursitis, left hip: Secondary | ICD-10-CM

## 2018-08-02 DIAGNOSIS — G894 Chronic pain syndrome: Secondary | ICD-10-CM

## 2018-08-02 DIAGNOSIS — M545 Low back pain: Secondary | ICD-10-CM | POA: Insufficient documentation

## 2018-08-02 DIAGNOSIS — F329 Major depressive disorder, single episode, unspecified: Secondary | ICD-10-CM | POA: Insufficient documentation

## 2018-08-02 DIAGNOSIS — J449 Chronic obstructive pulmonary disease, unspecified: Secondary | ICD-10-CM | POA: Insufficient documentation

## 2018-08-02 DIAGNOSIS — M5416 Radiculopathy, lumbar region: Secondary | ICD-10-CM | POA: Diagnosis not present

## 2018-08-02 DIAGNOSIS — R000 Tachycardia, unspecified: Secondary | ICD-10-CM | POA: Insufficient documentation

## 2018-08-02 DIAGNOSIS — E039 Hypothyroidism, unspecified: Secondary | ICD-10-CM | POA: Insufficient documentation

## 2018-08-02 DIAGNOSIS — F17210 Nicotine dependence, cigarettes, uncomplicated: Secondary | ICD-10-CM | POA: Insufficient documentation

## 2018-08-02 DIAGNOSIS — M62830 Muscle spasm of back: Secondary | ICD-10-CM | POA: Diagnosis not present

## 2018-08-02 DIAGNOSIS — F419 Anxiety disorder, unspecified: Secondary | ICD-10-CM | POA: Insufficient documentation

## 2018-08-02 DIAGNOSIS — M1712 Unilateral primary osteoarthritis, left knee: Secondary | ICD-10-CM | POA: Diagnosis not present

## 2018-08-02 DIAGNOSIS — I5032 Chronic diastolic (congestive) heart failure: Secondary | ICD-10-CM | POA: Insufficient documentation

## 2018-08-02 DIAGNOSIS — Z5181 Encounter for therapeutic drug level monitoring: Secondary | ICD-10-CM

## 2018-08-02 DIAGNOSIS — Z79899 Other long term (current) drug therapy: Secondary | ICD-10-CM

## 2018-08-02 MED ORDER — OXYCODONE-ACETAMINOPHEN 10-325 MG PO TABS: 1.0000 | ORAL_TABLET | Freq: Three times a day (TID) | ORAL | 0 refills | Status: DC | PRN

## 2018-08-02 NOTE — Telephone Encounter (Signed)
Ashley Houston wants Ashley Houston to call her ASAP because she is scared to come out of her home due to coronavirus and wants to see if she can fax over her prescription/medicine to her pharmacy so she will not have to come out of her home.

## 2018-08-02 NOTE — Telephone Encounter (Signed)
Patient called and left a message requesting a call back from [place].

## 2018-08-02 NOTE — Progress Notes (Addendum)
This Provider Placed a call to Ashley Houston, she had a  scheduled office follow up appointment, she called office and express she is fearful due to the COVID-19 virus. Her appointment was changed to a virtual office visit to reduce the risk of exposure to the virus and to help her remain healthy and safe. The virtual visit will also provide continuity of care.. She reports her pain this morning was  located in her lower back radiating into her left buttock and left hip. She states prior to her Oxycodone her pain level was 10 and right now her pain level is 0. Her current exercise regime is walking.  Ashley Houston Morphine equivalent is 45. . She  is also prescribed Lorazepam by Lianne Moris .We have discussed the black box warning of using opioids and benzodiazepines. I highlighted the dangers of using these drugs together and discussed the adverse events including respiratory suppression, overdose, cognitive impairment and importance of compliance with current regimen. We will continue to monitor and adjust as indicated.  Last Oral Swab was Performed on 07/08/2018.  Assessment & Plan:  1. Chronic Low Back Pain likely related to lumbar spondylosis and facet Arthropathy: Continue HEP as Tolerated. Continue to Monitor.  2. Left Lumbar Radiculitis: Continue Gabapentin. Continue to Monitor.  3. OA of Left Knee/ Chronic Pain Syndrome: Refilled: Oxycodone 10/325mg  one tablet every 8 hours as needed for pain.#90.  4. Muscle Spasm: Continue Flexeril as needed.  5. Left Greater Trochanter Tenderness: Continue to Alternate Ice and Heat Therapy. Continue to Monitor.

## 2018-08-02 NOTE — Telephone Encounter (Signed)
Ashley Houston, scheduled visit will be changed to a virtual visit today. This provider will place a call to Ms. Petropoulos today.

## 2018-08-05 ENCOUNTER — Telehealth: Payer: Self-pay | Admitting: Registered Nurse

## 2018-08-05 ENCOUNTER — Ambulatory Visit: Payer: Medicare HMO | Admitting: Registered Nurse

## 2018-08-05 DIAGNOSIS — M7062 Trochanteric bursitis, left hip: Secondary | ICD-10-CM

## 2018-08-05 DIAGNOSIS — M1712 Unilateral primary osteoarthritis, left knee: Secondary | ICD-10-CM

## 2018-08-05 NOTE — Telephone Encounter (Signed)
Received a call from Ashley Houston reporting her pharmacy never received her Oxycodone prescription. Placed a call to [facility], pharmacist reports they never received prescription. Prescription e-scribed today and Ashley Houston is aware of the above.

## 2018-08-06 ENCOUNTER — Encounter: Payer: Medicare HMO | Admitting: Registered Nurse

## 2018-08-23 DIAGNOSIS — J0101 Acute recurrent maxillary sinusitis: Secondary | ICD-10-CM | POA: Diagnosis not present

## 2018-08-23 DIAGNOSIS — Z6821 Body mass index (BMI) 21.0-21.9, adult: Secondary | ICD-10-CM | POA: Diagnosis not present

## 2018-08-25 NOTE — Progress Notes (Signed)
Telephone Call Location of Patient: In her Home Location of Provider: In office Total Time Spent: 

## 2018-09-03 ENCOUNTER — Encounter: Payer: Medicare HMO | Attending: Physical Medicine & Rehabilitation | Admitting: Registered Nurse

## 2018-09-03 ENCOUNTER — Encounter: Payer: Self-pay | Admitting: Registered Nurse

## 2018-09-03 ENCOUNTER — Other Ambulatory Visit: Payer: Self-pay

## 2018-09-03 VITALS — Ht 66.0 in | Wt 129.0 lb

## 2018-09-03 DIAGNOSIS — E039 Hypothyroidism, unspecified: Secondary | ICD-10-CM | POA: Insufficient documentation

## 2018-09-03 DIAGNOSIS — I5032 Chronic diastolic (congestive) heart failure: Secondary | ICD-10-CM | POA: Insufficient documentation

## 2018-09-03 DIAGNOSIS — M5136 Other intervertebral disc degeneration, lumbar region: Secondary | ICD-10-CM

## 2018-09-03 DIAGNOSIS — F329 Major depressive disorder, single episode, unspecified: Secondary | ICD-10-CM | POA: Insufficient documentation

## 2018-09-03 DIAGNOSIS — G8929 Other chronic pain: Secondary | ICD-10-CM | POA: Insufficient documentation

## 2018-09-03 DIAGNOSIS — F17210 Nicotine dependence, cigarettes, uncomplicated: Secondary | ICD-10-CM | POA: Insufficient documentation

## 2018-09-03 DIAGNOSIS — Z79899 Other long term (current) drug therapy: Secondary | ICD-10-CM | POA: Diagnosis not present

## 2018-09-03 DIAGNOSIS — M545 Low back pain: Secondary | ICD-10-CM | POA: Insufficient documentation

## 2018-09-03 DIAGNOSIS — G894 Chronic pain syndrome: Secondary | ICD-10-CM

## 2018-09-03 DIAGNOSIS — M5416 Radiculopathy, lumbar region: Secondary | ICD-10-CM | POA: Diagnosis not present

## 2018-09-03 DIAGNOSIS — Z5181 Encounter for therapeutic drug level monitoring: Secondary | ICD-10-CM

## 2018-09-03 DIAGNOSIS — M1712 Unilateral primary osteoarthritis, left knee: Secondary | ICD-10-CM | POA: Diagnosis not present

## 2018-09-03 DIAGNOSIS — M7062 Trochanteric bursitis, left hip: Secondary | ICD-10-CM | POA: Diagnosis not present

## 2018-09-03 DIAGNOSIS — F419 Anxiety disorder, unspecified: Secondary | ICD-10-CM | POA: Insufficient documentation

## 2018-09-03 DIAGNOSIS — R000 Tachycardia, unspecified: Secondary | ICD-10-CM | POA: Insufficient documentation

## 2018-09-03 DIAGNOSIS — J449 Chronic obstructive pulmonary disease, unspecified: Secondary | ICD-10-CM | POA: Insufficient documentation

## 2018-09-03 MED ORDER — OXYCODONE-ACETAMINOPHEN 10-325 MG PO TABS: 1.0000 | ORAL_TABLET | Freq: Three times a day (TID) | ORAL | 0 refills | Status: DC | PRN

## 2018-09-03 NOTE — Progress Notes (Signed)
Subjective:    Patient ID: Ashley Houston, female    DOB: [birth year], 78 y.o.   MRN: [id number]  HPI: Ashley Houston is a 78 y.o. female her appointment was changed, due to national recommendations of social distancing due to COVID 19, an audio/video telehealth visit is felt to be most appropriate for this patient at this time.  See Chart message from today for the patient's consent to telehealth from [facility].     She  States her pain is located in her lower back radiating into her left buttock and left hip. Also reports left knee pain. She.rates her pain at this time 0. Her current exercise regime is walking and performing stretching exercises.  Ashley Houston Morphine equivalent is 45.00 MME. She is also prescribed Lorazepam by [name]. We have discussed the black box warning of using opioids and benzodiazepines. I highlighted the dangers of using these drugs together and discussed the adverse events including respiratory suppression, overdose, cognitive impairment and importance of compliance with current regimen. We will continue to monitor and adjust as indicated.   Ashley Houston CMA asked the Health and History Questions. This provider and Ashley Houston verified we were speaking with the correct person using two identifiers.  Pain Inventory Average Pain 8 Pain Right Now 0 My pain is intermittent, dull and aching  In the last 24 hours, has pain interfered with the following? General activity 8 Relation with others 8 Enjoyment of life 8 What TIME of day is your pain at its worst? morning Sleep (in general) Good  Pain is worse with: walking, bending, standing and some activites Pain improves with: heat/ice, therapy/exercise and medication Relief from Meds: 10  Mobility walk without assistance ability to climb steps?  yes do you drive?  yes  Function retired  Neuro/Psych numbness tingling spasms depression anxiety  Prior  Studies Any changes since last visit?  no  Physicians involved in your care Any changes since last visit?  no   No family history on file. Social History   Socioeconomic History  . Marital status: Widowed    Spouse name: Not on file  . Number of children: Not on file  . Years of education: Not on file  . Highest education level: Not on file  Occupational History  . Not on file  Social Needs  . Financial resource strain: Not on file  . Food insecurity:    Worry: Not on file    Inability: Not on file  . Transportation needs:    Medical: Not on file    Non-medical: Not on file  Tobacco Use  . Smoking status: Current Every Day Smoker    Packs/day: 0.50    Types: Cigarettes  . Smokeless tobacco: Never Used  Substance and Sexual Activity  . Alcohol use: No  . Drug use: No  . Sexual activity: Not on file  Lifestyle  . Physical activity:    Days per week: Not on file    Minutes per session: Not on file  . Stress: Not on file  Relationships  . Social connections:    Talks on phone: Not on file    Gets together: Not on file    Attends religious service: Not on file    Active member of club or organization: Not on file    Attends meetings of clubs or organizations: Not on file    Relationship status: Not on file  Other Topics Concern  . Not on  file  Social History Narrative  . Not on file   Past Surgical History:  Procedure Laterality Date  . ABDOMINAL HYSTERECTOMY    . CHOLECYSTECTOMY    . TONSILLECTOMY     Past Medical History:  Diagnosis Date  . Anxiety   . Chronic diastolic heart failure (HCC)   . Chronic pain    hips back and shoulders  . COPD (chronic obstructive pulmonary disease) (HCC)   . Depression   . Hypothyroidism 09/08/2015  . Tachycardia   . Thyroid disease    There were no vitals taken for this visit.  Opioid Risk Score:   Fall Risk Score:  `1  Depression screen PHQ 2/9  [facility] 04/01/2018 03/01/2018 02/01/2018  01/08/2018 08/26/2017 07/31/2017 02/03/2017  Decreased Interest 0 0 0 0 0 1 1  Down, Depressed, Hopeless 0 0 0 0 0 1 1  PHQ - 2 Score 0 0 0 0 0 2 2  Altered sleeping - - - - 0 - -  Tired, decreased energy - - - - 0 - -  Change in appetite - - - - 0 - -  Feeling bad or failure about yourself  - - - - 0 - -  Trouble concentrating - - - - 0 - -  Moving slowly or fidgety/restless - - - - 0 - -  Suicidal thoughts - - - - 0 - -  PHQ-9 Score - - - - 0 - -  Difficult doing work/chores - - - - Not difficult at all - -     Review of Systems  Constitutional: Negative.   HENT: Negative.   Eyes: Negative.   Respiratory: Negative.   Cardiovascular: Negative.   Gastrointestinal: Negative.   Endocrine: Negative.   Genitourinary: Negative.   Musculoskeletal: Positive for arthralgias and myalgias.  Skin: Negative.   Allergic/Immunologic: Negative.   Neurological: Negative.   Hematological: Negative.   Psychiatric/Behavioral: Positive for dysphoric mood. The patient is nervous/anxious.   All other systems reviewed and are negative.      Objective:   Physical Exam Vitals signs and nursing note reviewed.  Musculoskeletal:     Comments: No Physical Exam: Virtual Visit  Neurological:     Mental Status: She is oriented to person, place, and time.           Assessment & Plan:  1. 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 OA of Left Knee: Refilled Oxycodone 10/325 mg one tablet every 8 hours as needed #90.09/03/2018 We will continue the opioid monitoring program, this consists of regular clinic visits, examinations, urine drug screen, pill counts as well as use of [place] Controlled Substance [place] System. Continue Voltaren Gel and HEP as tolerated. 3. Muscle Spasms: Continuecurrent medication regimen withFlexeril as needed.09/03/2018 4. Left Greater Trochanter Bursitis: Continue to Alternate with  Heat and Ice Therapy. Continue to Monitor. 09/03/2018  F/U in 1 month Telephone Ca;;  Location of patient: In her Home Location of provider: Office Established patient Time spent on call: 10 Minutes

## 2018-09-27 DIAGNOSIS — R300 Dysuria: Secondary | ICD-10-CM | POA: Diagnosis not present

## 2018-09-27 DIAGNOSIS — R413 Other amnesia: Secondary | ICD-10-CM | POA: Diagnosis not present

## 2018-09-27 DIAGNOSIS — R5383 Other fatigue: Secondary | ICD-10-CM | POA: Diagnosis not present

## 2018-09-27 DIAGNOSIS — Z6821 Body mass index (BMI) 21.0-21.9, adult: Secondary | ICD-10-CM | POA: Diagnosis not present

## 2018-09-30 DIAGNOSIS — R413 Other amnesia: Secondary | ICD-10-CM | POA: Diagnosis not present

## 2018-09-30 DIAGNOSIS — I6782 Cerebral ischemia: Secondary | ICD-10-CM | POA: Diagnosis not present

## 2018-10-01 ENCOUNTER — Encounter: Payer: Medicare HMO | Attending: Physical Medicine & Rehabilitation | Admitting: Registered Nurse

## 2018-10-01 ENCOUNTER — Encounter: Payer: Self-pay | Admitting: Registered Nurse

## 2018-10-01 ENCOUNTER — Other Ambulatory Visit: Payer: Self-pay

## 2018-10-01 VITALS — Ht 66.0 in | Wt 129.0 lb

## 2018-10-01 DIAGNOSIS — Z79899 Other long term (current) drug therapy: Secondary | ICD-10-CM

## 2018-10-01 DIAGNOSIS — M7062 Trochanteric bursitis, left hip: Secondary | ICD-10-CM | POA: Diagnosis not present

## 2018-10-01 DIAGNOSIS — J449 Chronic obstructive pulmonary disease, unspecified: Secondary | ICD-10-CM | POA: Insufficient documentation

## 2018-10-01 DIAGNOSIS — F419 Anxiety disorder, unspecified: Secondary | ICD-10-CM | POA: Insufficient documentation

## 2018-10-01 DIAGNOSIS — M5416 Radiculopathy, lumbar region: Secondary | ICD-10-CM | POA: Diagnosis not present

## 2018-10-01 DIAGNOSIS — M5136 Other intervertebral disc degeneration, lumbar region: Secondary | ICD-10-CM | POA: Diagnosis not present

## 2018-10-01 DIAGNOSIS — I5032 Chronic diastolic (congestive) heart failure: Secondary | ICD-10-CM | POA: Insufficient documentation

## 2018-10-01 DIAGNOSIS — G8929 Other chronic pain: Secondary | ICD-10-CM | POA: Insufficient documentation

## 2018-10-01 DIAGNOSIS — F17210 Nicotine dependence, cigarettes, uncomplicated: Secondary | ICD-10-CM | POA: Insufficient documentation

## 2018-10-01 DIAGNOSIS — M545 Low back pain: Secondary | ICD-10-CM | POA: Insufficient documentation

## 2018-10-01 DIAGNOSIS — M1712 Unilateral primary osteoarthritis, left knee: Secondary | ICD-10-CM | POA: Diagnosis not present

## 2018-10-01 DIAGNOSIS — Z5181 Encounter for therapeutic drug level monitoring: Secondary | ICD-10-CM

## 2018-10-01 DIAGNOSIS — G894 Chronic pain syndrome: Secondary | ICD-10-CM

## 2018-10-01 DIAGNOSIS — R000 Tachycardia, unspecified: Secondary | ICD-10-CM | POA: Insufficient documentation

## 2018-10-01 DIAGNOSIS — F329 Major depressive disorder, single episode, unspecified: Secondary | ICD-10-CM | POA: Insufficient documentation

## 2018-10-01 DIAGNOSIS — E039 Hypothyroidism, unspecified: Secondary | ICD-10-CM | POA: Insufficient documentation

## 2018-10-01 MED ORDER — OXYCODONE-ACETAMINOPHEN 10-325 MG PO TABS: 1.0000 | ORAL_TABLET | Freq: Three times a day (TID) | ORAL | 0 refills | Status: DC | PRN

## 2018-10-01 NOTE — Progress Notes (Signed)
Subjective:    Patient ID: Ashley EconomyPhyllis Houston, female    DOB: 11/28/1940, 78 y.o.   MRN: [id number]  HPI: Ashley Houston is a 78 y.o. female her appointment was changed, due to national recommendations of social distancing due to COVID 19, an audio/video telehealth visit is felt to be most appropriate for this patient at this time.  See Chart message from today for the patient's consent to telehealth from [facility].     She states her pain is located in her lower back radiating into her left buttock, left hip and left knee pain. She rates her pain at this time 0. Her current exercise regime is walking and performing stretching exercises.   Ashley Houston Morphine equivalent is 45.00 MME. She is also prescribed Lorazepam by Lianne MorisErin Carroll Pa-C. We have discussed the black box warning of using opioids and benzodiazepines. I highlighted the dangers of using these drugs together and discussed the adverse events including respiratory suppression, overdose, cognitive impairment and importance of compliance with current regimen. We will continue to monitor and adjust as indicated.   Angela NevinKenneth Wessling CMA asked the Health and History Questions. This provider and Purvis SheffieldKen Wessling verified we were speaking with the correct person using two identifiers.  Pain Inventory Average Pain 5 Pain Right Now 0 My pain is intermittent, dull and aching  In the last 24 hours, has pain interfered with the following? General activity  Relation with others 0 Enjoyment of life 0 What TIME of day is your pain at its worst? all Sleep (in general) Good  Pain is worse with: walking, bending, sitting, standing and some activites Pain improves with: rest, heat/ice, therapy/exercise and medication Relief from Meds: 10  Mobility walk without assistance how many minutes can you walk? 15 ability to climb steps?  yes do you drive?  yes  Function retired  Neuro/Psych spasms depression  anxiety  Prior Studies Any changes since last visit?  no  Physicians involved in your care Any changes since last visit?  no   History reviewed. No pertinent family history. Social History   Socioeconomic History  . Marital status: Widowed    Spouse name: Not on file  . Number of children: Not on file  . Years of education: Not on file  . Highest education level: Not on file  Occupational History  . Not on file  Social Needs  . Financial resource strain: Not on file  . Food insecurity:    Worry: Not on file    Inability: Not on file  . Transportation needs:    Medical: Not on file    Non-medical: Not on file  Tobacco Use  . Smoking status: Current Every Day Smoker    Packs/day: 0.50    Types: Cigarettes  . Smokeless tobacco: Never Used  Substance and Sexual Activity  . Alcohol use: No  . Drug use: No  . Sexual activity: Not on file  Lifestyle  . Physical activity:    Days per week: Not on file    Minutes per session: Not on file  . Stress: Not on file  Relationships  . Social connections:    Talks on phone: Not on file    Gets together: Not on file    Attends religious service: Not on file    Active member of club or organization: Not on file    Attends meetings of clubs or organizations: Not on file    Relationship status: Not on file  Other Topics Concern  . Not on file  Social History Narrative  . Not on file   Past Surgical History:  Procedure Laterality Date  . ABDOMINAL HYSTERECTOMY    . CHOLECYSTECTOMY    . TONSILLECTOMY     Past Medical History:  Diagnosis Date  . Anxiety   . Chronic diastolic heart failure (HCC)   . Chronic pain    hips back and shoulders  . COPD (chronic obstructive pulmonary disease) (HCC)   . Depression   . Hypothyroidism 09/08/2015  . Tachycardia   . Thyroid disease    Ht 5\' 6"  (1.676 m)   Wt 129 lb (58.5 kg)   BMI 20.82 kg/m   Opioid Risk Score:   Fall Risk Score:  `1  Depression screen PHQ 2/9   Depression screen [facility] 2/9 04/01/2018 03/01/2018 02/01/2018 01/08/2018 08/26/2017 07/31/2017 02/03/2017  Decreased Interest 0 0 0 0 0 1 1  Down, Depressed, Hopeless 0 0 0 0 0 1 1  PHQ - 2 Score 0 0 0 0 0 2 2  Altered sleeping - - - - 0 - -  Tired, decreased energy - - - - 0 - -  Change in appetite - - - - 0 - -  Feeling bad or failure about yourself  - - - - 0 - -  Trouble concentrating - - - - 0 - -  Moving slowly or fidgety/restless - - - - 0 - -  Suicidal thoughts - - - - 0 - -  PHQ-9 Score - - - - 0 - -  Difficult doing work/chores - - - - Not difficult at all - -    Review of Systems  Constitutional: Negative.   HENT: Negative.   Eyes: Negative.   Respiratory: Positive for shortness of breath.   Cardiovascular: Negative.   Gastrointestinal: Negative.   Endocrine: Negative.   Genitourinary: Negative.   Musculoskeletal: Positive for arthralgias, back pain and gait problem.  Skin: Negative.   Allergic/Immunologic: Negative.   Hematological: Negative.   Psychiatric/Behavioral: Positive for dysphoric mood. The patient is nervous/anxious.        Objective:   Physical Exam Vitals signs and nursing note reviewed.  Musculoskeletal:     Comments: No Physical Exam Performed: Virtual Visit  Neurological:     Mental Status: She is oriented to person, place, and time.           Assessment & Plan:  1. 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 OA of Left Knee: Refilled Oxycodone 10/325 mg one tablet every 8 hours as needed #90.10/01/2018 We will continue the opioid monitoring program, this consists of regular clinic visits, examinations, urine drug screen, pill counts as well as use of [place] Controlled Substance reporting System. Continue Voltaren Gel and HEP as tolerated. 3. Muscle Spasms: Continuecurrent medication regimen withFlexeril as needed.10/01/2018 4. Left Greater Trochanter  Bursitis: Continue to Alternate with Heat and Ice Therapy. Continue to Monitor. 10/01/2018  F/U in 1 month  Telephone Call  Location of patient:In her Home Location of provider: Office Established patient Time spent on call: 10 Minutes

## 2018-10-18 DIAGNOSIS — Z6820 Body mass index (BMI) 20.0-20.9, adult: Secondary | ICD-10-CM | POA: Diagnosis not present

## 2018-10-18 DIAGNOSIS — R300 Dysuria: Secondary | ICD-10-CM | POA: Diagnosis not present

## 2018-10-29 ENCOUNTER — Encounter: Payer: Self-pay | Admitting: Registered Nurse

## 2018-10-29 ENCOUNTER — Other Ambulatory Visit: Payer: Self-pay

## 2018-10-29 ENCOUNTER — Encounter: Payer: Medicare HMO | Attending: Physical Medicine & Rehabilitation | Admitting: Registered Nurse

## 2018-10-29 VITALS — BP 100/62 | HR 72 | Temp 97.3°F | Resp 12 | Ht 65.0 in | Wt 128.0 lb

## 2018-10-29 DIAGNOSIS — Z79899 Other long term (current) drug therapy: Secondary | ICD-10-CM

## 2018-10-29 DIAGNOSIS — Z5181 Encounter for therapeutic drug level monitoring: Secondary | ICD-10-CM

## 2018-10-29 DIAGNOSIS — M1712 Unilateral primary osteoarthritis, left knee: Secondary | ICD-10-CM | POA: Insufficient documentation

## 2018-10-29 DIAGNOSIS — M7062 Trochanteric bursitis, left hip: Secondary | ICD-10-CM | POA: Diagnosis not present

## 2018-10-29 DIAGNOSIS — E039 Hypothyroidism, unspecified: Secondary | ICD-10-CM | POA: Diagnosis not present

## 2018-10-29 DIAGNOSIS — G8929 Other chronic pain: Secondary | ICD-10-CM | POA: Insufficient documentation

## 2018-10-29 DIAGNOSIS — F17210 Nicotine dependence, cigarettes, uncomplicated: Secondary | ICD-10-CM | POA: Diagnosis not present

## 2018-10-29 DIAGNOSIS — I5032 Chronic diastolic (congestive) heart failure: Secondary | ICD-10-CM | POA: Diagnosis not present

## 2018-10-29 DIAGNOSIS — M5136 Other intervertebral disc degeneration, lumbar region: Secondary | ICD-10-CM

## 2018-10-29 DIAGNOSIS — G894 Chronic pain syndrome: Secondary | ICD-10-CM

## 2018-10-29 DIAGNOSIS — F419 Anxiety disorder, unspecified: Secondary | ICD-10-CM | POA: Insufficient documentation

## 2018-10-29 DIAGNOSIS — R000 Tachycardia, unspecified: Secondary | ICD-10-CM | POA: Insufficient documentation

## 2018-10-29 DIAGNOSIS — J449 Chronic obstructive pulmonary disease, unspecified: Secondary | ICD-10-CM | POA: Diagnosis not present

## 2018-10-29 DIAGNOSIS — F329 Major depressive disorder, single episode, unspecified: Secondary | ICD-10-CM | POA: Insufficient documentation

## 2018-10-29 DIAGNOSIS — M5416 Radiculopathy, lumbar region: Secondary | ICD-10-CM | POA: Diagnosis not present

## 2018-10-29 DIAGNOSIS — M545 Low back pain: Secondary | ICD-10-CM | POA: Diagnosis not present

## 2018-10-29 MED ORDER — OXYCODONE-ACETAMINOPHEN 10-325 MG PO TABS: 1.0000 | ORAL_TABLET | Freq: Three times a day (TID) | ORAL | 0 refills | Status: DC | PRN

## 2018-10-29 NOTE — Progress Notes (Signed)
Subjective:    Patient ID: Ashley Houston, female    DOB: 01/14/1941, 78 y.o.   MRN: [id number]  HPI: Ashley Economyhyllis Billiter is a 78 y.o. female who returns for follow up appointment for chronic pain and medication refill. She states her pain is located in her lower back radiating into her left hip and left lower extremity. She rates her pain 8. Her  current exercise regime is walking and performing stretching exercises.  [name] Morphine equivalent is 45.00MME.  She  is also prescribed Lorazepam  by Lianne MorisErin Carroll Pa-C. We have discussed the black box warning of using opioids and benzodiazepines. I highlighted the dangers of using these drugs together and discussed the adverse events including respiratory suppression, overdose, cognitive impairment and importance of compliance with current regimen. We will continue to monitor and adjust as indicated.    Pain Inventory Average Pain 8 Pain Right Now 8 My pain is dull and aching  In the last 24 hours, has pain interfered with the following? General activity 4 Relation with others 0 Enjoyment of life 0 What TIME of day is your pain at its worst? morning,daytime,evening Sleep (in general) Good  Pain is worse with: walking, bending, sitting and standing Pain improves with: rest, heat/ice, therapy/exercise, pacing activities and medication Relief from Meds: 10  Mobility do you drive?  yes  Function I need assistance with the following:  meal prep, household duties and shopping  Neuro/Psych bowel control problems spasms depression anxiety  Prior Studies CT/MRI  Physicians involved in your care Dr. Arnette FeltsErin Jones   History reviewed. No pertinent family history. Social History   Socioeconomic History  . Marital status: Widowed    Spouse name: Not on file  . Number of children: Not on file  . Years of education: Not on file  . Highest education level: Not on file  Occupational History  . Not on file  Social Needs  .  Financial resource strain: Not on file  . Food insecurity    Worry: Not on file    Inability: Not on file  . Transportation needs    Medical: Not on file    Non-medical: Not on file  Tobacco Use  . Smoking status: Current Every Day Smoker    Packs/day: 0.50    Types: Cigarettes  . Smokeless tobacco: Never Used  Substance and Sexual Activity  . Alcohol use: No  . Drug use: No  . Sexual activity: Not on file  Lifestyle  . Physical activity    Days per week: Not on file    Minutes per session: Not on file  . Stress: Not on file  Relationships  . Social Musicianconnections    Talks on phone: Not on file    Gets together: Not on file    Attends religious service: Not on file    Active member of club or organization: Not on file    Attends meetings of clubs or organizations: Not on file    Relationship status: Not on file  Other Topics Concern  . Not on file  Social History Narrative  . Not on file   Past Surgical History:  Procedure Laterality Date  . ABDOMINAL HYSTERECTOMY    . CHOLECYSTECTOMY    . TONSILLECTOMY     Past Medical History:  Diagnosis Date  . Anxiety   . Chronic diastolic heart failure (HCC)   . Chronic pain    hips back and shoulders  . COPD (chronic obstructive pulmonary disease) (HCC)   .  Depression   . Hypothyroidism 09/08/2015  . Tachycardia   . Thyroid disease    There were no vitals taken for this visit.  Opioid Risk Score:   Fall Risk Score:  `1  Depression screen PHQ 2/9  Depression screen [facility] 2/9 04/01/2018 03/01/2018 02/01/2018 01/08/2018 08/26/2017 07/31/2017 02/03/2017  Decreased Interest 0 0 0 0 0 1 1  Down, Depressed, Hopeless 0 0 0 0 0 1 1  PHQ - 2 Score 0 0 0 0 0 2 2  Altered sleeping - - - - 0 - -  Tired, decreased energy - - - - 0 - -  Change in appetite - - - - 0 - -  Feeling bad or failure about yourself  - - - - 0 - -  Trouble concentrating - - - - 0 - -  Moving slowly or fidgety/restless - - - - 0 - -  Suicidal thoughts - - - - 0  - -  PHQ-9 Score - - - - 0 - -  Difficult doing work/chores - - - - Not difficult at all - -      Review of Systems     Objective:   Physical Exam Vitals signs and nursing note reviewed.  Constitutional:      Appearance: Normal appearance.  Neck:     Musculoskeletal: Normal range of motion and neck supple.  Cardiovascular:     Rate and Rhythm: Normal rate and regular rhythm.     Pulses: Normal pulses.     Heart sounds: Normal heart sounds.  Pulmonary:     Effort: Pulmonary effort is normal.     Breath sounds: Normal breath sounds.  Musculoskeletal:     Comments: Normal Muscle Bulk and Muscle Testing Reveals:  Upper Extremities: Full ROM and Muscle Strength 5/5 , Lumbar Paraspinal Tenderness: L-4-L-5 Left Greater Trochanter Tenderness Lower Extremities: Full ROM and Muscle Strength 5/5 Arises from Table with ease Narrow based  Gait   Skin:    General: Skin is warm and dry.  Neurological:     Mental Status: She is alert and oriented to person, place, and time.  Psychiatric:        Mood and Affect: Mood normal.        Behavior: Behavior normal.           Assessment & Plan:  1. 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 OA of Left Knee: Refilled Oxycodone 10/325 mg one tablet every 8 hours as needed #90.10/29/2018 We will continue the opioid monitoring program, this consists of regular clinic visits, examinations, urine drug screen, pill counts as well as use of [place] Controlled Substance reporting System. Continue Voltaren Gel and HEP as tolerated. 3. Muscle Spasms: Continuecurrent medication regimen withFlexeril as needed.10/29/2018 4. Left Greater Trochanter Bursitis: Continue to Alternate with Heat and Ice Therapy. Continue to Monitor. 10/29/2018 5. Left Lumbar Radiculitis: Continue current medication regimen. Continue to monitor.   34minutes of face to face patient  care time was spent during this visit. All questions were encouraged and answered.  F/U in 1 month

## 2018-11-09 DIAGNOSIS — I10 Essential (primary) hypertension: Secondary | ICD-10-CM | POA: Diagnosis not present

## 2018-11-09 DIAGNOSIS — E782 Mixed hyperlipidemia: Secondary | ICD-10-CM | POA: Diagnosis not present

## 2018-11-26 ENCOUNTER — Encounter: Payer: Medicare HMO | Attending: Physical Medicine & Rehabilitation | Admitting: Registered Nurse

## 2018-11-26 ENCOUNTER — Encounter: Payer: Self-pay | Admitting: Registered Nurse

## 2018-11-26 ENCOUNTER — Other Ambulatory Visit: Payer: Self-pay

## 2018-11-26 VITALS — BP 107/67 | HR 68 | Temp 97.8°F | Resp 14 | Ht 65.0 in | Wt 128.0 lb

## 2018-11-26 DIAGNOSIS — M5416 Radiculopathy, lumbar region: Secondary | ICD-10-CM

## 2018-11-26 DIAGNOSIS — F17210 Nicotine dependence, cigarettes, uncomplicated: Secondary | ICD-10-CM | POA: Insufficient documentation

## 2018-11-26 DIAGNOSIS — E039 Hypothyroidism, unspecified: Secondary | ICD-10-CM | POA: Insufficient documentation

## 2018-11-26 DIAGNOSIS — M1712 Unilateral primary osteoarthritis, left knee: Secondary | ICD-10-CM

## 2018-11-26 DIAGNOSIS — G8929 Other chronic pain: Secondary | ICD-10-CM | POA: Insufficient documentation

## 2018-11-26 DIAGNOSIS — F329 Major depressive disorder, single episode, unspecified: Secondary | ICD-10-CM | POA: Insufficient documentation

## 2018-11-26 DIAGNOSIS — I5032 Chronic diastolic (congestive) heart failure: Secondary | ICD-10-CM | POA: Diagnosis not present

## 2018-11-26 DIAGNOSIS — G894 Chronic pain syndrome: Secondary | ICD-10-CM | POA: Diagnosis not present

## 2018-11-26 DIAGNOSIS — M7062 Trochanteric bursitis, left hip: Secondary | ICD-10-CM | POA: Diagnosis not present

## 2018-11-26 DIAGNOSIS — M545 Low back pain: Secondary | ICD-10-CM | POA: Diagnosis not present

## 2018-11-26 DIAGNOSIS — Z79899 Other long term (current) drug therapy: Secondary | ICD-10-CM | POA: Diagnosis not present

## 2018-11-26 DIAGNOSIS — Z5181 Encounter for therapeutic drug level monitoring: Secondary | ICD-10-CM

## 2018-11-26 DIAGNOSIS — M5136 Other intervertebral disc degeneration, lumbar region: Secondary | ICD-10-CM

## 2018-11-26 DIAGNOSIS — F419 Anxiety disorder, unspecified: Secondary | ICD-10-CM | POA: Insufficient documentation

## 2018-11-26 DIAGNOSIS — R000 Tachycardia, unspecified: Secondary | ICD-10-CM | POA: Diagnosis not present

## 2018-11-26 DIAGNOSIS — J449 Chronic obstructive pulmonary disease, unspecified: Secondary | ICD-10-CM | POA: Diagnosis not present

## 2018-11-26 MED ORDER — OXYCODONE-ACETAMINOPHEN 10-325 MG PO TABS: 1.0000 | ORAL_TABLET | Freq: Three times a day (TID) | ORAL | 0 refills | Status: DC | PRN

## 2018-11-26 NOTE — Progress Notes (Signed)
Subjective:    Patient ID: Ashley Houston, female    DOB: Aug 17, 1940, 78 y.o.   MRN: [id number]  HPI: Ashley Houston is a 78 y.o. female who returns for follow up appointment for chronic pain and medication refill. She states her pain is located in her lower back radiating into her left hip and left lower extremity. Also reports left knee pain. She did not  rates her pain today, she reported her average pain 8. Her current exercise regime is walking and performing stretching exercises.  Ashley Houston equivalent is 45.00MME.  Last Oral Swab was Performed on 07/08/2018, it was consistent.    Pain Inventory Average Pain 8 Pain Right Now n/a My pain is dull and aching  In the last 24 hours, has pain interfered with the following? General activity 1 Relation with others 1 Enjoyment of life 0 What TIME of day is your pain at its worst? morning, evening, daytime Sleep (in general) Good  Pain is worse with: walking, bending, sitting, standing and some activites Pain improves with: rest, heat/ice, therapy/exercise and medication Relief from Meds: 10  Mobility do you drive?  yes  Function I need assistance with the following:  meal prep, household duties and shopping  Neuro/Psych spasms depression anxiety  Prior Studies no  Physicians involved in your care no   No family history on file. Social History   Socioeconomic History  . Marital status: Widowed    Spouse name: Not on file  . Number of children: Not on file  . Years of education: Not on file  . Highest education level: Not on file  Occupational History  . Not on file  Social Needs  . Financial resource strain: Not on file  . Food insecurity    Worry: Not on file    Inability: Not on file  . Transportation needs    Medical: Not on file    Non-medical: Not on file  Tobacco Use  . Smoking status: Current Every Day Smoker    Packs/day: 0.50    Types: Cigarettes  . Smokeless tobacco: Never Used   Substance and Sexual Activity  . Alcohol use: No  . Drug use: No  . Sexual activity: Not on file  Lifestyle  . Physical activity    Days per week: Not on file    Minutes per session: Not on file  . Stress: Not on file  Relationships  . Social Herbalist on phone: Not on file    Gets together: Not on file    Attends religious service: Not on file    Active member of club or organization: Not on file    Attends meetings of clubs or organizations: Not on file    Relationship status: Not on file  Other Topics Concern  . Not on file  Social History Narrative  . Not on file   Past Surgical History:  Procedure Laterality Date  . ABDOMINAL HYSTERECTOMY    . CHOLECYSTECTOMY    . TONSILLECTOMY     Past Medical History:  Diagnosis Date  . Anxiety   . Chronic diastolic heart failure ([place])   . Chronic pain    hips back and shoulders  . COPD (chronic obstructive pulmonary disease) ([place])   . Depression   . Hypothyroidism 09/08/2015  . Tachycardia   . Thyroid disease    There were no vitals taken for this visit.  Opioid Risk Score:   Fall Risk Score:  `1  Depression  screen PHQ 2/9  [facility] 04/01/2018 03/01/2018 02/01/2018 01/08/2018 08/26/2017 07/31/2017 02/03/2017  Decreased Interest 0 0 0 0 0 1 1  Down, Depressed, Hopeless 0 0 0 0 0 1 1  PHQ - 2 Score 0 0 0 0 0 2 2  Altered sleeping - - - - 0 - -  Tired, decreased energy - - - - 0 - -  Change in appetite - - - - 0 - -  Feeling bad or failure about yourself  - - - - 0 - -  Trouble concentrating - - - - 0 - -  Moving slowly or fidgety/restless - - - - 0 - -  Suicidal thoughts - - - - 0 - -  PHQ-9 Score - - - - 0 - -  Difficult doing work/chores - - - - Not difficult at all - -      Review of Systems  Respiratory: Positive for shortness of breath.   Gastrointestinal: Positive for diarrhea and vomiting.  All other systems reviewed and are negative.      Objective:   Physical Exam Vitals  signs and nursing note reviewed.  Constitutional:      Appearance: Normal appearance.  Neck:     Musculoskeletal: Normal range of motion and neck supple.  Cardiovascular:     Rate and Rhythm: Normal rate and regular rhythm.     Pulses: Normal pulses.     Heart sounds: Normal heart sounds.  Pulmonary:     Effort: Pulmonary effort is normal.     Breath sounds: Normal breath sounds.  Musculoskeletal:     Comments: Normal Muscle Bulk and Muscle Testing Reveals:  Upper Extremities: Full  ROM and Muscle Strength 5/5  Lumbar Paraspinal Tenderness: L-4-L-5 Left Greater Trochanteric tenderness Lower Extremities: Full ROM and Muscle Strength 5/5 Arises from Table with ease Narrow Based Gait   Skin:    General: Skin is warm and dry.  Neurological:     Mental Status: She is alert and oriented to person, place, and time.  Psychiatric:        Mood and Affect: Mood normal.        Behavior: Behavior normal.           Assessment & Plan:  1. 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 OA of Left Knee: Refilled Oxycodone 10/325 mg one tablet every 8 hours as needed #90.11/26/2018 We will continue the opioid monitoring program, this consists of regular clinic visits, examinations, urine drug screen, pill counts as well as use of [place] Controlled Substance reporting System. Continue Voltaren Gel and HEP as tolerated. 3. Muscle Spasms: Continuecurrent medication regimen withFlexeril as needed.11/26/2018 4. Left Greater Trochanter Bursitis: Continue to Alternate with Heat and Ice Therapy. Continue to Monitor. 11/26/2018  15 minutes of face to face patient care time was spent during this visit. All questions were encouraged and answered.  F/U in 1 month

## 2018-12-10 DIAGNOSIS — J449 Chronic obstructive pulmonary disease, unspecified: Secondary | ICD-10-CM | POA: Diagnosis not present

## 2018-12-10 DIAGNOSIS — I10 Essential (primary) hypertension: Secondary | ICD-10-CM | POA: Diagnosis not present

## 2018-12-10 DIAGNOSIS — E782 Mixed hyperlipidemia: Secondary | ICD-10-CM | POA: Diagnosis not present

## 2018-12-24 ENCOUNTER — Encounter: Payer: Medicare HMO | Attending: Physical Medicine & Rehabilitation | Admitting: Registered Nurse

## 2018-12-24 ENCOUNTER — Other Ambulatory Visit: Payer: Self-pay

## 2018-12-24 ENCOUNTER — Encounter: Payer: Self-pay | Admitting: Registered Nurse

## 2018-12-24 VITALS — BP 104/63 | HR 69

## 2018-12-24 DIAGNOSIS — G894 Chronic pain syndrome: Secondary | ICD-10-CM | POA: Diagnosis not present

## 2018-12-24 DIAGNOSIS — M5416 Radiculopathy, lumbar region: Secondary | ICD-10-CM | POA: Diagnosis not present

## 2018-12-24 DIAGNOSIS — J449 Chronic obstructive pulmonary disease, unspecified: Secondary | ICD-10-CM | POA: Diagnosis not present

## 2018-12-24 DIAGNOSIS — Z5181 Encounter for therapeutic drug level monitoring: Secondary | ICD-10-CM | POA: Diagnosis not present

## 2018-12-24 DIAGNOSIS — F419 Anxiety disorder, unspecified: Secondary | ICD-10-CM | POA: Diagnosis not present

## 2018-12-24 DIAGNOSIS — E039 Hypothyroidism, unspecified: Secondary | ICD-10-CM | POA: Diagnosis not present

## 2018-12-24 DIAGNOSIS — M7062 Trochanteric bursitis, left hip: Secondary | ICD-10-CM

## 2018-12-24 DIAGNOSIS — M5136 Other intervertebral disc degeneration, lumbar region: Secondary | ICD-10-CM | POA: Diagnosis not present

## 2018-12-24 DIAGNOSIS — Z79899 Other long term (current) drug therapy: Secondary | ICD-10-CM

## 2018-12-24 DIAGNOSIS — M545 Low back pain: Secondary | ICD-10-CM | POA: Diagnosis not present

## 2018-12-24 DIAGNOSIS — F329 Major depressive disorder, single episode, unspecified: Secondary | ICD-10-CM | POA: Diagnosis not present

## 2018-12-24 DIAGNOSIS — F17210 Nicotine dependence, cigarettes, uncomplicated: Secondary | ICD-10-CM | POA: Insufficient documentation

## 2018-12-24 DIAGNOSIS — G8929 Other chronic pain: Secondary | ICD-10-CM | POA: Insufficient documentation

## 2018-12-24 DIAGNOSIS — M1712 Unilateral primary osteoarthritis, left knee: Secondary | ICD-10-CM

## 2018-12-24 DIAGNOSIS — I5032 Chronic diastolic (congestive) heart failure: Secondary | ICD-10-CM | POA: Insufficient documentation

## 2018-12-24 DIAGNOSIS — R000 Tachycardia, unspecified: Secondary | ICD-10-CM | POA: Insufficient documentation

## 2018-12-24 MED ORDER — OXYCODONE-ACETAMINOPHEN 10-325 MG PO TABS: 1.0000 | ORAL_TABLET | Freq: Three times a day (TID) | ORAL | 0 refills | Status: DC | PRN

## 2018-12-24 NOTE — Progress Notes (Signed)
Subjective:    Patient ID: Ashley Houston, female    DOB: 09/23/1940, 78 y.o.   MRN: [id number]  HPI: Ashley Houston is a 78 y.o. female who returns for follow up appointment for chronic pain and medication refill. She states her pain is located in her lower back radiating into her left hip and left left lower extremity and left knee pain. She rates her  Pain 8. Her current exercise regime is walking .  Ashley Houston Morphine equivalent is 45.00 MME. She  is also prescribed Lorazepam by Lianne MorisErin Carroll Pa-C. We have discussed the black box warning of using opioids and benzodiazepines. I highlighted the dangers of using these drugs together and discussed the adverse events including respiratory suppression, overdose, cognitive impairment and importance of compliance with current regimen. We will continue to monitor and adjust as indicated.   Last Oral Swab was Performed on 07/08/2018, it was consistent.    Pain Inventory Average Pain 8 Pain Right Now 8 My pain is dull and aching  In the last 24 hours, has pain interfered with the following? General activity 1 Relation with others 1 Enjoyment of life 1 What TIME of day is your pain at its worst? morning Sleep (in general) Good  Pain is worse with: walking, bending, sitting and standing Pain improves with: rest, heat/ice, therapy/exercise, pacing activities and medication Relief from Meds: 10  Mobility do you drive?  yes  Function retired I need assistance with the following:  meal prep, household duties and shopping  Neuro/Psych spasms depression anxiety  Prior Studies Any changes since last visit?  no  Physicians involved in your care Any changes since last visit?  no   No family history on file. Social History   Socioeconomic History  . Marital status: Widowed    Spouse name: Not on file  . Number of children: Not on file  . Years of education: Not on file  . Highest education level: Not on file  Occupational  History  . Not on file  Social Needs  . Financial resource strain: Not on file  . Food insecurity    Worry: Not on file    Inability: Not on file  . Transportation needs    Medical: Not on file    Non-medical: Not on file  Tobacco Use  . Smoking status: Current Every Day Smoker    Packs/day: 0.50    Types: Cigarettes  . Smokeless tobacco: Never Used  Substance and Sexual Activity  . Alcohol use: No  . Drug use: No  . Sexual activity: Not on file  Lifestyle  . Physical activity    Days per week: Not on file    Minutes per session: Not on file  . Stress: Not on file  Relationships  . Social Musicianconnections    Talks on phone: Not on file    Gets together: Not on file    Attends religious service: Not on file    Active member of club or organization: Not on file    Attends meetings of clubs or organizations: Not on file    Relationship status: Not on file  Other Topics Concern  . Not on file  Social History Narrative  . Not on file   Past Surgical History:  Procedure Laterality Date  . ABDOMINAL HYSTERECTOMY    . CHOLECYSTECTOMY    . TONSILLECTOMY     Past Medical History:  Diagnosis Date  . Anxiety   . Chronic diastolic heart failure (HCC)   .  Chronic pain    hips back and shoulders  . COPD (chronic obstructive pulmonary disease) (HCC)   . Depression   . Hypothyroidism 09/08/2015  . Tachycardia   . Thyroid disease    There were no vitals taken for this visit.  Opioid Risk Score:   Fall Risk Score:  `1  Depression screen PHQ 2/9  [facility]2018 03/01/2018 02/01/2018 01/08/2018 08/26/2017 07/31/2017 02/03/2017  Decreased Interest 0 0 0 0 0 1 1  Down, Depressed, Hopeless 0 0 0 0 0 1 1  PHQ - 2 Score 0 0 0 0 0 2 2  Altered sleeping - - - - 0 - -  Tired, decreased energy - - - - 0 - -  Change in appetite - - - - 0 - -  Feeling bad or failure about yourself  - - - - 0 - -  Trouble concentrating - - - - 0 - -  Moving slowly or fidgety/restless  - - - - 0 - -  Suicidal thoughts - - - - 0 - -  PHQ-9 Score - - - - 0 - -  Difficult doing work/chores - - - - Not difficult at all - -     Review of Systems  Constitutional: Negative.   HENT: Negative.   Eyes: Negative.   Respiratory: Positive for shortness of breath.   Cardiovascular: Negative.   Gastrointestinal: Positive for constipation.  Endocrine: Negative.   Genitourinary: Negative.   Musculoskeletal: Positive for back pain and myalgias.  Skin: Negative.   Allergic/Immunologic: Negative.   Neurological: Negative.   Psychiatric/Behavioral: Positive for dysphoric mood. The patient is nervous/anxious.   All other systems reviewed and are negative.      Objective:   Physical Exam Vitals signs and nursing note reviewed.  Constitutional:      Appearance: Normal appearance.  Neck:     Musculoskeletal: Normal range of motion and neck supple.  Cardiovascular:     Rate and Rhythm: Normal rate and regular rhythm.     Pulses: Normal pulses.     Heart sounds: Normal heart sounds. No murmur.  Pulmonary:     Effort: Pulmonary effort is normal.     Breath sounds: Normal breath sounds.  Musculoskeletal:     Comments: Normal Muscle Bulk and Muscle Testing Reveals:  Upper Extremities: Full ROM and Muscle Strength 5/5 Lumbar Paraspinal Tenderness: L-4-L-5 Left Greater Trochanter Tenderness Lower Extremities: Full ROM and Muscle Strength 5/5 Arises from table with ease Narrow Based  Gait   Skin:    General: Skin is warm and dry.  Neurological:     Mental Status: She is alert and oriented to person, place, and time.  Psychiatric:        Mood and Affect: Mood normal.        Behavior: Behavior normal.           Assessment & Plan:  1. 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 OA of Left Knee: Refilled Oxycodone 10/325 mg one tablet every 8 hours as needed #90.12/24/2018 We will continue  the opioid monitoring program, this consists of regular clinic visits, examinations, urine drug screen, pill counts as well as [place]. Continue Voltaren Gel and HEP as tolerated. 3. Muscle Spasms: Continuecurrent medication regimen withFlexeril as needed.12/24/2018 4. Left Greater Trochanter Bursitis: Continue to Alternate with Heat and Ice Therapy. Continue to Monitor. 12/24/2018  15 minutes of face to face patient care time was spent during this visit. All questions were encouraged and answered.  F/U in 1 month

## 2019-01-06 DIAGNOSIS — J449 Chronic obstructive pulmonary disease, unspecified: Secondary | ICD-10-CM | POA: Diagnosis not present

## 2019-01-06 DIAGNOSIS — R300 Dysuria: Secondary | ICD-10-CM | POA: Diagnosis not present

## 2019-01-06 DIAGNOSIS — E782 Mixed hyperlipidemia: Secondary | ICD-10-CM | POA: Diagnosis not present

## 2019-01-06 DIAGNOSIS — M25562 Pain in left knee: Secondary | ICD-10-CM | POA: Diagnosis not present

## 2019-01-06 DIAGNOSIS — F411 Generalized anxiety disorder: Secondary | ICD-10-CM | POA: Diagnosis not present

## 2019-01-06 DIAGNOSIS — I10 Essential (primary) hypertension: Secondary | ICD-10-CM | POA: Diagnosis not present

## 2019-01-06 DIAGNOSIS — Z6820 Body mass index (BMI) 20.0-20.9, adult: Secondary | ICD-10-CM | POA: Diagnosis not present

## 2019-01-06 DIAGNOSIS — M545 Low back pain: Secondary | ICD-10-CM | POA: Diagnosis not present

## 2019-01-06 DIAGNOSIS — Z6821 Body mass index (BMI) 21.0-21.9, adult: Secondary | ICD-10-CM | POA: Diagnosis not present

## 2019-01-11 DIAGNOSIS — M810 Age-related osteoporosis without current pathological fracture: Secondary | ICD-10-CM | POA: Diagnosis not present

## 2019-01-11 DIAGNOSIS — M85851 Other specified disorders of bone density and structure, right thigh: Secondary | ICD-10-CM | POA: Diagnosis not present

## 2019-01-11 DIAGNOSIS — M8589 Other specified disorders of bone density and structure, multiple sites: Secondary | ICD-10-CM | POA: Diagnosis not present

## 2019-01-27 ENCOUNTER — Telehealth: Payer: Self-pay | Admitting: Registered Nurse

## 2019-01-27 DIAGNOSIS — F17200 Nicotine dependence, unspecified, uncomplicated: Secondary | ICD-10-CM | POA: Diagnosis not present

## 2019-01-27 DIAGNOSIS — R531 Weakness: Secondary | ICD-10-CM | POA: Diagnosis not present

## 2019-01-27 DIAGNOSIS — R05 Cough: Secondary | ICD-10-CM | POA: Diagnosis not present

## 2019-01-27 DIAGNOSIS — R0902 Hypoxemia: Secondary | ICD-10-CM | POA: Diagnosis not present

## 2019-01-27 DIAGNOSIS — Z7982 Long term (current) use of aspirin: Secondary | ICD-10-CM | POA: Diagnosis not present

## 2019-01-27 DIAGNOSIS — J449 Chronic obstructive pulmonary disease, unspecified: Secondary | ICD-10-CM | POA: Diagnosis not present

## 2019-01-27 DIAGNOSIS — K5900 Constipation, unspecified: Secondary | ICD-10-CM | POA: Diagnosis not present

## 2019-01-27 DIAGNOSIS — R0602 Shortness of breath: Secondary | ICD-10-CM | POA: Diagnosis not present

## 2019-01-27 DIAGNOSIS — J441 Chronic obstructive pulmonary disease with (acute) exacerbation: Secondary | ICD-10-CM | POA: Diagnosis not present

## 2019-01-27 DIAGNOSIS — Z9981 Dependence on supplemental oxygen: Secondary | ICD-10-CM | POA: Diagnosis not present

## 2019-01-27 DIAGNOSIS — Z20828 Contact with and (suspected) exposure to other viral communicable diseases: Secondary | ICD-10-CM | POA: Diagnosis not present

## 2019-01-27 DIAGNOSIS — R791 Abnormal coagulation profile: Secondary | ICD-10-CM | POA: Diagnosis not present

## 2019-01-27 DIAGNOSIS — I712 Thoracic aortic aneurysm, without rupture: Secondary | ICD-10-CM | POA: Diagnosis not present

## 2019-01-27 DIAGNOSIS — J189 Pneumonia, unspecified organism: Secondary | ICD-10-CM | POA: Diagnosis not present

## 2019-01-27 DIAGNOSIS — J9611 Chronic respiratory failure with hypoxia: Secondary | ICD-10-CM | POA: Diagnosis not present

## 2019-01-27 NOTE — Telephone Encounter (Signed)
Pt has appointment 01/28/19. She called this morning because she is sick. She wants to do a phone visit tomorrow. She is congested and coughing. Please advise.

## 2019-01-27 NOTE — Telephone Encounter (Signed)
Please call patient to see if she has medication, if so schedule her for next week.  Ashley Houston .

## 2019-01-28 ENCOUNTER — Encounter: Payer: Medicare HMO | Admitting: Registered Nurse

## 2019-01-31 ENCOUNTER — Telehealth: Payer: Self-pay | Admitting: Physical Medicine & Rehabilitation

## 2019-01-31 DIAGNOSIS — M7062 Trochanteric bursitis, left hip: Secondary | ICD-10-CM

## 2019-01-31 DIAGNOSIS — M1712 Unilateral primary osteoarthritis, left knee: Secondary | ICD-10-CM

## 2019-01-31 MED ORDER — OXYCODONE-ACETAMINOPHEN 10-325 MG PO TABS: 1.0000 | ORAL_TABLET | Freq: Three times a day (TID) | ORAL | 0 refills | Status: DC | PRN

## 2019-01-31 NOTE — Telephone Encounter (Signed)
Patient called to let us know that she has been in hospital, but she is out now and will be out of her pain medications as of 9/27.  I have made patient an appointment in October to see Zella Ball.  If there are any issues please call patient.

## 2019-01-31 NOTE — Telephone Encounter (Signed)
Placed a call to Ashley Houston , she was hospitalized at [facility] in [place]. PMP was reviewed. Oxycodone e-scribed. This provider placed a call to Ashley Houston she is aware of the above.

## 2019-02-09 DIAGNOSIS — E782 Mixed hyperlipidemia: Secondary | ICD-10-CM | POA: Diagnosis not present

## 2019-02-09 DIAGNOSIS — I10 Essential (primary) hypertension: Secondary | ICD-10-CM | POA: Diagnosis not present

## 2019-02-09 DIAGNOSIS — Z23 Encounter for immunization: Secondary | ICD-10-CM | POA: Diagnosis not present

## 2019-02-09 DIAGNOSIS — Z1331 Encounter for screening for depression: Secondary | ICD-10-CM | POA: Diagnosis not present

## 2019-02-09 DIAGNOSIS — Z1389 Encounter for screening for other disorder: Secondary | ICD-10-CM | POA: Diagnosis not present

## 2019-02-09 DIAGNOSIS — J441 Chronic obstructive pulmonary disease with (acute) exacerbation: Secondary | ICD-10-CM | POA: Diagnosis not present

## 2019-02-23 ENCOUNTER — Other Ambulatory Visit: Payer: Self-pay

## 2019-02-23 ENCOUNTER — Encounter: Payer: Medicare HMO | Attending: Physical Medicine & Rehabilitation | Admitting: Registered Nurse

## 2019-02-23 VITALS — BP 108/63 | HR 72 | Temp 97.3°F | Ht 66.0 in | Wt 134.8 lb

## 2019-02-23 DIAGNOSIS — F17210 Nicotine dependence, cigarettes, uncomplicated: Secondary | ICD-10-CM | POA: Insufficient documentation

## 2019-02-23 DIAGNOSIS — F329 Major depressive disorder, single episode, unspecified: Secondary | ICD-10-CM | POA: Insufficient documentation

## 2019-02-23 DIAGNOSIS — E039 Hypothyroidism, unspecified: Secondary | ICD-10-CM | POA: Diagnosis not present

## 2019-02-23 DIAGNOSIS — Z5181 Encounter for therapeutic drug level monitoring: Secondary | ICD-10-CM | POA: Diagnosis not present

## 2019-02-23 DIAGNOSIS — M545 Low back pain: Secondary | ICD-10-CM | POA: Diagnosis not present

## 2019-02-23 DIAGNOSIS — I5032 Chronic diastolic (congestive) heart failure: Secondary | ICD-10-CM | POA: Diagnosis not present

## 2019-02-23 DIAGNOSIS — G894 Chronic pain syndrome: Secondary | ICD-10-CM | POA: Diagnosis not present

## 2019-02-23 DIAGNOSIS — M5416 Radiculopathy, lumbar region: Secondary | ICD-10-CM | POA: Diagnosis not present

## 2019-02-23 DIAGNOSIS — G8929 Other chronic pain: Secondary | ICD-10-CM | POA: Insufficient documentation

## 2019-02-23 DIAGNOSIS — M7062 Trochanteric bursitis, left hip: Secondary | ICD-10-CM | POA: Diagnosis not present

## 2019-02-23 DIAGNOSIS — M5136 Other intervertebral disc degeneration, lumbar region: Secondary | ICD-10-CM

## 2019-02-23 DIAGNOSIS — M1712 Unilateral primary osteoarthritis, left knee: Secondary | ICD-10-CM

## 2019-02-23 DIAGNOSIS — J449 Chronic obstructive pulmonary disease, unspecified: Secondary | ICD-10-CM | POA: Insufficient documentation

## 2019-02-23 DIAGNOSIS — R000 Tachycardia, unspecified: Secondary | ICD-10-CM | POA: Insufficient documentation

## 2019-02-23 DIAGNOSIS — F419 Anxiety disorder, unspecified: Secondary | ICD-10-CM | POA: Insufficient documentation

## 2019-02-23 DIAGNOSIS — Z79891 Long term (current) use of opiate analgesic: Secondary | ICD-10-CM | POA: Diagnosis not present

## 2019-02-23 MED ORDER — OXYCODONE-ACETAMINOPHEN 10-325 MG PO TABS: 1.0000 | ORAL_TABLET | Freq: Three times a day (TID) | ORAL | 0 refills | Status: DC | PRN

## 2019-02-23 NOTE — Progress Notes (Signed)
Subjective:    Patient ID: Ashley Houston, female    DOB: [birth year], 78 y.o.   MRN: [id number]  HPI: Ashley Houston is a 78 y.o. female who returns for follow up appointment for chronic pain and medication refill. She states her pain is located in her lower back radiating into her left hip and left lower extremity. Also reports left knee pain. She rates her pain 5. Her  current exercise regime is walking.  [name] Morphine equivalent is 45.00   MME.  Oral Swab performed today.   Pain Inventory Average Pain 8 Pain Right Now 5 My pain is dull and stabbing  In the last 24 hours, has pain interfered with the following? General activity 1 Relation with others 1 Enjoyment of life 1 What TIME of day is your pain at its worst? morning daytime evening Sleep (in general) Fair  Pain is worse with: walking, bending, sitting and standing Pain improves with: rest, heat/ice, pacing activities and medication Relief from Meds: 10  Mobility do you drive?  yes  Function I need assistance with the following:  meal prep, household duties and shopping  Neuro/Psych spasms depression anxiety  Prior Studies bone scan  Physicians involved in your care Any changes since last visit?  no Primary care Jamal Maes, PA   No family history on file. Social History   Socioeconomic History  . Marital status: Widowed    Spouse name: Not on file  . Number of children: Not on file  . Years of education: Not on file  . Highest education level: Not on file  Occupational History  . Not on file  Social Needs  . Financial resource strain: Not on file  . Food insecurity    Worry: Not on file    Inability: Not on file  . Transportation needs    Medical: Not on file    Non-medical: Not on file  Tobacco Use  . Smoking status: Current Every Day Smoker    Packs/day: 0.50    Types: Cigarettes  . Smokeless tobacco: Never Used  Substance and Sexual Activity  . Alcohol use: No  . Drug use:  No  . Sexual activity: Not on file  Lifestyle  . Physical activity    Days per week: Not on file    Minutes per session: Not on file  . Stress: Not on file  Relationships  . Social Herbalist on phone: Not on file    Gets together: Not on file    Attends religious service: Not on file    Active member of club or organization: Not on file    Attends meetings of clubs or organizations: Not on file    Relationship status: Not on file  Other Topics Concern  . Not on file  Social History Narrative  . Not on file   Past Surgical History:  Procedure Laterality Date  . ABDOMINAL HYSTERECTOMY    . CHOLECYSTECTOMY    . TONSILLECTOMY     Past Medical History:  Diagnosis Date  . Anxiety   . Chronic diastolic heart failure ([place])   . Chronic pain    hips back and shoulders  . COPD (chronic obstructive pulmonary disease) ([place])   . Depression   . Hypothyroidism 09/08/2015  . Tachycardia   . Thyroid disease    BP 108/63   Pulse 72   Temp (!) 97.3 F (36.3 C)   Ht 5\' 6"  (7.829 m)   Wt 134 lb  12.8 oz (61.1 kg)   SpO2 95%   BMI 21.76 kg/m   Opioid Risk Score:   Fall Risk Score:  `1  Depression screen PHQ 2/9  Depression screen [facility] 2/9 04/01/2018 03/01/2018 02/01/2018 01/08/2018 08/26/2017 07/31/2017 02/03/2017  Decreased Interest 0 0 0 0 0 1 1  Down, Depressed, Hopeless 0 0 0 0 0 1 1  PHQ - 2 Score 0 0 0 0 0 2 2  Altered sleeping - - - - 0 - -  Tired, decreased energy - - - - 0 - -  Change in appetite - - - - 0 - -  Feeling bad or failure about yourself  - - - - 0 - -  Trouble concentrating - - - - 0 - -  Moving slowly or fidgety/restless - - - - 0 - -  Suicidal thoughts - - - - 0 - -  PHQ-9 Score - - - - 0 - -  Difficult doing work/chores - - - - Not difficult at all - -    Review of Systems  Constitutional: Positive for diaphoresis.  Respiratory: Positive for shortness of breath.   Gastrointestinal: Positive for constipation.  Musculoskeletal:       Spasms  Hip pain and knee pain  Psychiatric/Behavioral: Positive for dysphoric mood. The patient is nervous/anxious.   All other systems reviewed and are negative.      Objective:   Physical Exam Vitals signs and nursing note reviewed.  Constitutional:      Appearance: Normal appearance.  Neck:     Musculoskeletal: Normal range of motion and neck supple.  Cardiovascular:     Rate and Rhythm: Normal rate and regular rhythm.     Pulses: Normal pulses.     Heart sounds: Normal heart sounds.  Pulmonary:     Effort: Pulmonary effort is normal.     Breath sounds: Normal breath sounds.  Musculoskeletal:     Comments: Normal Muscle Bulk and Muscle Testing Reveals:  Upper Extremities:Full  ROM and Muscle Strength 5/5  Lumbar Paraspinal Tenderness: L-4-L-5 Left Greater Trochanter Tenderness Lower Extremities: Full ROM and Muscle Strength 5/5 Arises from Table with ease Narrow Based Gait   Skin:    General: Skin is warm and dry.  Neurological:     Mental Status: She is alert and oriented to person, place, and time.  Psychiatric:        Mood and Affect: Mood normal.        Behavior: Behavior normal.           Assessment & Plan:  1. 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 OA of Left Knee: Refilled Oxycodone 10/325 mg one tablet every 8 hours as needed #90.02/23/2019 We will continue the opioid monitoring program, this consists of regular clinic visits, examinations, urine drug screen, pill counts as well as use of [place] Controlled Substance reporting System. Continue Voltaren Gel and HEP as tolerated. 3. Muscle Spasms: Continuecurrent medication regimen withFlexeril as needed.02/23/2019 4. Left Greater Trochanter Bursitis: Continue to Alternate with Heat and Ice Therapy. Continue to Monitor. 02/23/2019  of face to face patient care time was spent during  this visit. All questions were encouraged and answered.  F/U in 1 month

## 2019-02-24 ENCOUNTER — Encounter: Payer: Self-pay | Admitting: Registered Nurse

## 2019-02-27 LAB — DRUG TOX MONITOR 1 W/CONF, ORAL FLD
Alprazolam: NEGATIVE ng/mL (ref <0.50)
Amphetamines: NEGATIVE ng/mL (ref <10)
Barbiturates: NEGATIVE ng/mL (ref <10)
Benzodiazepines: POSITIVE ng/mL — AB (ref <0.50)
Buprenorphine: NEGATIVE ng/mL (ref <0.10)
Chlordiazepoxide: NEGATIVE ng/mL (ref <0.50)
Clonazepam: NEGATIVE ng/mL (ref <0.50)
Cocaine: NEGATIVE ng/mL (ref <5.0)
Codeine: NEGATIVE ng/mL (ref <2.5)
Cotinine: 49.1 ng/mL — ABNORMAL HIGH (ref <5.0)
Diazepam: NEGATIVE ng/mL (ref <0.50)
Dihydrocodeine: NEGATIVE ng/mL (ref <2.5)
Fentanyl: NEGATIVE ng/mL (ref <0.10)
Flunitrazepam: NEGATIVE ng/mL (ref <0.50)
Flurazepam: NEGATIVE ng/mL (ref <0.50)
Heroin Metabolite: NEGATIVE ng/mL (ref <1.0)
Hydrocodone: NEGATIVE ng/mL (ref <2.5)
Hydromorphone: NEGATIVE ng/mL (ref <2.5)
Lorazepam: >25 ng/mL — ABNORMAL HIGH (ref <0.50)
MARIJUANA: NEGATIVE ng/mL (ref <2.5)
MDMA: NEGATIVE ng/mL (ref <10)
Meprobamate: NEGATIVE ng/mL (ref <2.5)
Methadone: NEGATIVE ng/mL (ref <5.0)
Midazolam: NEGATIVE ng/mL (ref <0.50)
Morphine: NEGATIVE ng/mL (ref <2.5)
Nicotine Metabolite: POSITIVE ng/mL — AB (ref <5.0)
Nordiazepam: NEGATIVE ng/mL (ref <0.50)
Norhydrocodone: NEGATIVE ng/mL (ref <2.5)
Noroxycodone: 76.8 ng/mL — ABNORMAL HIGH (ref <2.5)
Opiates: POSITIVE ng/mL — AB (ref <2.5)
Oxazepam: NEGATIVE ng/mL (ref <0.50)
Oxycodone: >250 ng/mL — ABNORMAL HIGH (ref <2.5)
Oxymorphone: NEGATIVE ng/mL (ref <2.5)
Phencyclidine: NEGATIVE ng/mL (ref <10)
Tapentadol: NEGATIVE ng/mL (ref <5.0)
Temazepam: NEGATIVE ng/mL (ref <0.50)
Tramadol: NEGATIVE ng/mL (ref <5.0)
Triazolam: NEGATIVE ng/mL (ref <0.50)
Zolpidem: NEGATIVE ng/mL (ref <5.0)

## 2019-02-27 LAB — DRUG TOX ALC METAB W/CON, ORAL FLD: Alcohol Metabolite: NEGATIVE ng/mL (ref <25)

## 2019-02-28 ENCOUNTER — Telehealth: Payer: Self-pay | Admitting: *Deleted

## 2019-02-28 DIAGNOSIS — Z6821 Body mass index (BMI) 21.0-21.9, adult: Secondary | ICD-10-CM | POA: Diagnosis not present

## 2019-02-28 DIAGNOSIS — J441 Chronic obstructive pulmonary disease with (acute) exacerbation: Secondary | ICD-10-CM | POA: Diagnosis not present

## 2019-02-28 NOTE — Telephone Encounter (Signed)
Oral swab drug screen was consistent for prescribed medications.  ?

## 2019-03-30 ENCOUNTER — Other Ambulatory Visit: Payer: Self-pay

## 2019-03-30 ENCOUNTER — Encounter: Payer: Medicare HMO | Attending: Physical Medicine & Rehabilitation | Admitting: Registered Nurse

## 2019-03-30 ENCOUNTER — Encounter: Payer: Self-pay | Admitting: Registered Nurse

## 2019-03-30 VITALS — BP 106/65 | HR 72 | Temp 97.9°F | Ht 66.0 in | Wt 132.0 lb

## 2019-03-30 DIAGNOSIS — Z5181 Encounter for therapeutic drug level monitoring: Secondary | ICD-10-CM | POA: Diagnosis not present

## 2019-03-30 DIAGNOSIS — Z79891 Long term (current) use of opiate analgesic: Secondary | ICD-10-CM

## 2019-03-30 DIAGNOSIS — F419 Anxiety disorder, unspecified: Secondary | ICD-10-CM | POA: Diagnosis not present

## 2019-03-30 DIAGNOSIS — M7062 Trochanteric bursitis, left hip: Secondary | ICD-10-CM

## 2019-03-30 DIAGNOSIS — M545 Low back pain: Secondary | ICD-10-CM | POA: Diagnosis not present

## 2019-03-30 DIAGNOSIS — M5416 Radiculopathy, lumbar region: Secondary | ICD-10-CM

## 2019-03-30 DIAGNOSIS — I5032 Chronic diastolic (congestive) heart failure: Secondary | ICD-10-CM | POA: Diagnosis not present

## 2019-03-30 DIAGNOSIS — G8929 Other chronic pain: Secondary | ICD-10-CM | POA: Diagnosis not present

## 2019-03-30 DIAGNOSIS — G894 Chronic pain syndrome: Secondary | ICD-10-CM | POA: Diagnosis not present

## 2019-03-30 DIAGNOSIS — F329 Major depressive disorder, single episode, unspecified: Secondary | ICD-10-CM | POA: Diagnosis not present

## 2019-03-30 DIAGNOSIS — J449 Chronic obstructive pulmonary disease, unspecified: Secondary | ICD-10-CM | POA: Insufficient documentation

## 2019-03-30 DIAGNOSIS — F17210 Nicotine dependence, cigarettes, uncomplicated: Secondary | ICD-10-CM | POA: Insufficient documentation

## 2019-03-30 DIAGNOSIS — M5136 Other intervertebral disc degeneration, lumbar region: Secondary | ICD-10-CM

## 2019-03-30 DIAGNOSIS — M1712 Unilateral primary osteoarthritis, left knee: Secondary | ICD-10-CM | POA: Insufficient documentation

## 2019-03-30 DIAGNOSIS — E039 Hypothyroidism, unspecified: Secondary | ICD-10-CM | POA: Diagnosis not present

## 2019-03-30 DIAGNOSIS — R000 Tachycardia, unspecified: Secondary | ICD-10-CM | POA: Insufficient documentation

## 2019-03-30 MED ORDER — OXYCODONE-ACETAMINOPHEN 10-325 MG PO TABS: 1.0000 | ORAL_TABLET | Freq: Three times a day (TID) | ORAL | 0 refills | Status: DC | PRN

## 2019-03-30 NOTE — Progress Notes (Signed)
Subjective:    Patient ID: Ashley Houston, female    DOB: [birth year], 78 y.o.   MRN: [id number]  HPI: Ashley Houston is a 78 y.o. female who returns for follow up appointment for chronic pain and medication refill. She states her pain is located in her lower back mainly left side radiating into her left buttock and left hip. She rates her pain 0 at this time. Her  current exercise regime is walking and performing stretching exercises.  Ashley Houston Morphine equivalent is 45.00 MME. She  is also prescribed Lorazepam  by Ashley Bal Pa-C. We have discussed the black box warning of using opioids and benzodiazepines. I highlighted the dangers of using these drugs together and discussed the adverse events including respiratory suppression, overdose, cognitive impairment and importance of compliance with current regimen. We will continue to monitor and adjust as indicated.   Last Oral Swab was Performed on 02/23/2019, it was consistent.    Pain Inventory Average Pain 8 Pain Right Now 0 My pain is dull and aching  In the last 24 hours, has pain interfered with the following? General activity 1 Relation with others 1 Enjoyment of life 1 What TIME of day is your pain at its worst? daytime Sleep (in general) Fair  Pain is worse with: walking, bending, sitting, standing and some activites Pain improves with: rest, heat/ice, therapy/exercise, pacing activities and medication Relief from Meds: 10  Mobility do you drive?  yes  Function I need assistance with the following:  meal prep, household duties and shopping  Neuro/Psych spasms depression anxiety  Prior Studies Any changes since last visit?  no  Physicians involved in your care Any changes since last visit?  no   No family history on file. Social History   Socioeconomic History  . Marital status: Widowed    Spouse name: Not on file  . Number of children: Not on file  . Years of education: Not on file  . Highest  education level: Not on file  Occupational History  . Not on file  Social Needs  . Financial resource strain: Not on file  . Food insecurity    Worry: Not on file    Inability: Not on file  . Transportation needs    Medical: Not on file    Non-medical: Not on file  Tobacco Use  . Smoking status: Current Every Day Smoker    Packs/day: 0.50    Types: Cigarettes  . Smokeless tobacco: Never Used  Substance and Sexual Activity  . Alcohol use: No  . Drug use: No  . Sexual activity: Not on file  Lifestyle  . Physical activity    Days per week: Not on file    Minutes per session: Not on file  . Stress: Not on file  Relationships  . Social Herbalist on phone: Not on file    Gets together: Not on file    Attends religious service: Not on file    Active member of club or organization: Not on file    Attends meetings of clubs or organizations: Not on file    Relationship status: Not on file  Other Topics Concern  . Not on file  Social History Narrative  . Not on file   Past Surgical History:  Procedure Laterality Date  . ABDOMINAL HYSTERECTOMY    . CHOLECYSTECTOMY    . TONSILLECTOMY     Past Medical History:  Diagnosis Date  . Anxiety   . Chronic  diastolic heart failure (HCC)   . Chronic pain    hips back and shoulders  . COPD (chronic obstructive pulmonary disease) (HCC)   . Depression   . Hypothyroidism 09/08/2015  . Tachycardia   . Thyroid disease    There were no vitals taken for this visit.  Opioid Risk Score:   Fall Risk Score:  `1  Depression screen PHQ 2/9  Depression screen [facility] 2/9 04/01/2018 03/01/2018 02/01/2018 01/08/2018 08/26/2017 07/31/2017 02/03/2017  Decreased Interest 0 0 0 0 0 1 1  Down, Depressed, Hopeless 0 0 0 0 0 1 1  PHQ - 2 Score 0 0 0 0 0 2 2  Altered sleeping - - - - 0 - -  Tired, decreased energy - - - - 0 - -  Change in appetite - - - - 0 - -  Feeling bad or failure about yourself  - - - - 0 - -  Trouble concentrating - - -  - 0 - -  Moving slowly or fidgety/restless - - - - 0 - -  Suicidal thoughts - - - - 0 - -  PHQ-9 Score - - - - 0 - -  Difficult doing work/chores - - - - Not difficult at all - -     Review of Systems  Constitutional: Positive for diaphoresis.  HENT: Negative.   Eyes: Negative.   Respiratory: Negative.   Cardiovascular: Negative.   Gastrointestinal: Positive for constipation.  Endocrine: Negative.   Genitourinary: Negative.   Musculoskeletal: Positive for arthralgias, back pain and myalgias.  Skin: Negative.   Allergic/Immunologic: Negative.   Neurological: Negative.   Hematological: Negative.   Psychiatric/Behavioral: Positive for dysphoric mood. The patient is nervous/anxious.   All other systems reviewed and are negative.      Objective:   Physical Exam Vitals signs and nursing note reviewed.  Constitutional:      Appearance: Normal appearance.  Neck:     Musculoskeletal: Normal range of motion and neck supple.  Cardiovascular:     Rate and Rhythm: Normal rate and regular rhythm.     Pulses: Normal pulses.     Heart sounds: Normal heart sounds.  Pulmonary:     Effort: Pulmonary effort is normal.     Breath sounds: Normal breath sounds.  Musculoskeletal:     Comments: Normal Muscle Bulk and Muscle Testing Reveals:  Upper Extremities: Full ROM and Muscle Strength 5/5  Lumbar Paraspinal Tenderness: L-3-L-5 Left Greater Trochanter Tenderness Lower Extremities: Full ROM and Muscle Strength 5/5 Arises from Table with  Ease Narrow Based Gait   Skin:    General: Skin is warm and dry.  Neurological:     Mental Status: She is alert and oriented to person, place, and time.           Assessment & Plan:  1. 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 OA of Left Knee: Refilled Oxycodone 10/325 mg one tablet every 8 hours as needed #90.02/23/2019 We will continue  the opioid monitoring program, this consists of regular clinic visits, examinations, urine drug screen, pill counts as well as use of [place] Controlled Substance reporting System. Continue Voltaren Gel and HEP as tolerated. 3. Muscle Spasms: Continuecurrent medication regimen withFlexeril as needed.03/30/2019 4. Left Greater Trochanter Bursitis: Continue to Alternate with Heat and Ice Therapy. Continue to Monitor. 03/30/2019  of face to face patient care time was spent during this visit.  All questions were encouraged and answered.  F/U in 1 month

## 2019-04-11 DIAGNOSIS — E782 Mixed hyperlipidemia: Secondary | ICD-10-CM | POA: Diagnosis not present

## 2019-04-11 DIAGNOSIS — J449 Chronic obstructive pulmonary disease, unspecified: Secondary | ICD-10-CM | POA: Diagnosis not present

## 2019-04-26 DIAGNOSIS — J209 Acute bronchitis, unspecified: Secondary | ICD-10-CM | POA: Diagnosis not present

## 2019-04-27 ENCOUNTER — Encounter: Payer: Medicare HMO | Admitting: Registered Nurse

## 2019-04-28 ENCOUNTER — Encounter: Payer: Medicare HMO | Attending: Physical Medicine & Rehabilitation | Admitting: Registered Nurse

## 2019-04-28 ENCOUNTER — Encounter: Payer: Self-pay | Admitting: Registered Nurse

## 2019-04-28 ENCOUNTER — Other Ambulatory Visit: Payer: Self-pay

## 2019-04-28 VITALS — Ht 66.0 in | Wt 128.0 lb

## 2019-04-28 DIAGNOSIS — F17210 Nicotine dependence, cigarettes, uncomplicated: Secondary | ICD-10-CM | POA: Insufficient documentation

## 2019-04-28 DIAGNOSIS — E039 Hypothyroidism, unspecified: Secondary | ICD-10-CM | POA: Insufficient documentation

## 2019-04-28 DIAGNOSIS — M4726 Other spondylosis with radiculopathy, lumbar region: Secondary | ICD-10-CM

## 2019-04-28 DIAGNOSIS — I5032 Chronic diastolic (congestive) heart failure: Secondary | ICD-10-CM | POA: Insufficient documentation

## 2019-04-28 DIAGNOSIS — F419 Anxiety disorder, unspecified: Secondary | ICD-10-CM | POA: Insufficient documentation

## 2019-04-28 DIAGNOSIS — M7062 Trochanteric bursitis, left hip: Secondary | ICD-10-CM | POA: Insufficient documentation

## 2019-04-28 DIAGNOSIS — M62838 Other muscle spasm: Secondary | ICD-10-CM | POA: Diagnosis not present

## 2019-04-28 DIAGNOSIS — F329 Major depressive disorder, single episode, unspecified: Secondary | ICD-10-CM | POA: Insufficient documentation

## 2019-04-28 DIAGNOSIS — M5416 Radiculopathy, lumbar region: Secondary | ICD-10-CM

## 2019-04-28 DIAGNOSIS — M545 Low back pain: Secondary | ICD-10-CM

## 2019-04-28 DIAGNOSIS — G8929 Other chronic pain: Secondary | ICD-10-CM | POA: Diagnosis not present

## 2019-04-28 DIAGNOSIS — Z79891 Long term (current) use of opiate analgesic: Secondary | ICD-10-CM

## 2019-04-28 DIAGNOSIS — M1712 Unilateral primary osteoarthritis, left knee: Secondary | ICD-10-CM | POA: Diagnosis not present

## 2019-04-28 DIAGNOSIS — G894 Chronic pain syndrome: Secondary | ICD-10-CM

## 2019-04-28 DIAGNOSIS — M5136 Other intervertebral disc degeneration, lumbar region: Secondary | ICD-10-CM

## 2019-04-28 DIAGNOSIS — Z5181 Encounter for therapeutic drug level monitoring: Secondary | ICD-10-CM

## 2019-04-28 DIAGNOSIS — R000 Tachycardia, unspecified: Secondary | ICD-10-CM | POA: Insufficient documentation

## 2019-04-28 DIAGNOSIS — J449 Chronic obstructive pulmonary disease, unspecified: Secondary | ICD-10-CM | POA: Insufficient documentation

## 2019-04-28 MED ORDER — OXYCODONE-ACETAMINOPHEN 10-325 MG PO TABS: 1.0000 | ORAL_TABLET | Freq: Three times a day (TID) | ORAL | 0 refills | Status: DC | PRN

## 2019-04-28 NOTE — Progress Notes (Signed)
Subjective:    Patient ID: Ashley Houston, female    DOB: 04/02/1941, 78 y.o.   MRN: [id number]  HPI: Ashley Houston is a 78 y.o. female whose appointment was changed to a tele-health visit , she reports she is coughing and has chest congestion. She had a virtual visit with her PCP and was prescribed antibiotic. She agrees with tele-health visit. She states her  pain is located in her lower back radiating into her left hip and left lower extremity. She rates her pain 10. Her current exercise regime is walking.  Ashley Houston Morphine equivalent is 45.00  MME.  She  is also prescribed Lorazepam by [name]. .We have discussed the black box warning of using opioids and benzodiazepines. I highlighted the dangers of using these drugs together and discussed the adverse events including respiratory suppression, overdose, cognitive impairment and importance of compliance with current regimen. We will continue to monitor and adjust as indicated.   Last Oral Swab was Performed on 02/23/2019, it was consistent.    Pain Inventory Average Pain 2 Pain Right Now 10 My pain is dull, stabbing and aching  In the last 24 hours, has pain interfered with the following? General activity 2 Relation with others 2 Enjoyment of life 2 What TIME of day is your pain at its worst? morning and evening Sleep (in general) Fair  Pain is worse with: walking, bending and sitting Pain improves with: heat/ice and medication Relief from Meds: 9  Mobility walk without assistance how many minutes can you walk? 15 ability to climb steps?  yes do you drive?  yes  Function disabled: date disabled . I need assistance with the following:  meal prep, household duties and shopping  Neuro/Psych bowel control problems trouble walking spasms depression anxiety  Prior Studies bone scan CT/MRI  Physicians involved in your care Primary care .   No family history on file. Social History    Socioeconomic History  . Marital status: Widowed    Spouse name: Not on file  . Number of children: Not on file  . Years of education: Not on file  . Highest education level: Not on file  Occupational History  . Not on file  Tobacco Use  . Smoking status: Current Every Day Smoker    Packs/day: 0.50    Types: Cigarettes  . Smokeless tobacco: Never Used  Substance and Sexual Activity  . Alcohol use: No  . Drug use: No  . Sexual activity: Not on file  Other Topics Concern  . Not on file  Social History Narrative  . Not on file   Social Determinants of Health   Financial Resource Strain:   . Difficulty of Paying Living Expenses: Not on file  Food Insecurity:   . Worried About Programme researcher, broadcasting/film/video in the Last Year: Not on file  . Ran Out of Food in the Last Year: Not on file  Transportation Needs:   . Lack of Transportation (Medical): Not on file  . Lack of Transportation (Non-Medical): Not on file  Physical Activity:   . Days of Exercise per Week: Not on file  . Minutes of Exercise per Session: Not on file  Stress:   . Feeling of Stress : Not on file  Social Connections:   . Frequency of Communication with Friends and Family: Not on file  . Frequency of Social Gatherings with Friends and Family: Not on file  . Attends Religious Services: Not on file  . Active Member  of Clubs or Organizations: Not on file  . Attends Archivist Meetings: Not on file  . Marital Status: Not on file   Past Surgical History:  Procedure Laterality Date  . ABDOMINAL HYSTERECTOMY    . CHOLECYSTECTOMY    . TONSILLECTOMY     Past Medical History:  Diagnosis Date  . Anxiety   . Chronic diastolic heart failure ([place])   . Chronic pain    hips back and shoulders  . COPD (chronic obstructive pulmonary disease) ([place])   . Depression   . Hypothyroidism 09/08/2015  . Tachycardia   . Thyroid disease    There were no vitals taken for this visit.  Opioid Risk Score:   Fall Risk  Score:  `1  Depression screen PHQ 2/9  Depression screen [facility] 2/9 04/01/2018 03/01/2018 02/01/2018 01/08/2018 08/26/2017 07/31/2017 02/03/2017  Decreased Interest 0 0 0 0 0 1 1  Down, Depressed, Hopeless 0 0 0 0 0 1 1  PHQ - 2 Score 0 0 0 0 0 2 2  Altered sleeping - - - - 0 - -  Tired, decreased energy - - - - 0 - -  Change in appetite - - - - 0 - -  Feeling bad or failure about yourself  - - - - 0 - -  Trouble concentrating - - - - 0 - -  Moving slowly or fidgety/restless - - - - 0 - -  Suicidal thoughts - - - - 0 - -  PHQ-9 Score - - - - 0 - -  Difficult doing work/chores - - - - Not difficult at all - -    Review of Systems     Objective:   Physical Exam Vitals and nursing note reviewed.  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 OA of Left Knee: Refilled Oxycodone 10/325 mg one tablet every 8 hours as needed #90.04/28/2019 We will continue the opioid monitoring program, this consists of regular clinic visits, examinations, urine drug screen, pill counts as well as use of [place] Controlled Substance reporting System. Continue Voltaren Gel and HEP as tolerated. 3. Muscle Spasms: Continuecurrent medication regimen withFlexeril as needed.04/28/2019 4. Left Greater Trochanter Bursitis: Continue to Alternate with Heat and Ice Therapy. Continue to Monitor.04/28/2019  F/U in 1 month  Telephone Call Location of patient: In her Home Location of provider: Office Established patient Time spent on call: 10 Minutes

## 2019-05-25 ENCOUNTER — Encounter: Payer: Self-pay | Admitting: Registered Nurse

## 2019-05-25 ENCOUNTER — Encounter: Payer: Medicare HMO | Attending: Physical Medicine & Rehabilitation | Admitting: Registered Nurse

## 2019-05-25 ENCOUNTER — Other Ambulatory Visit: Payer: Self-pay

## 2019-05-25 VITALS — Temp 96.1°F | Ht 66.0 in | Wt 130.0 lb

## 2019-05-25 DIAGNOSIS — G8929 Other chronic pain: Secondary | ICD-10-CM | POA: Insufficient documentation

## 2019-05-25 DIAGNOSIS — M1712 Unilateral primary osteoarthritis, left knee: Secondary | ICD-10-CM | POA: Diagnosis not present

## 2019-05-25 DIAGNOSIS — J449 Chronic obstructive pulmonary disease, unspecified: Secondary | ICD-10-CM | POA: Insufficient documentation

## 2019-05-25 DIAGNOSIS — M5416 Radiculopathy, lumbar region: Secondary | ICD-10-CM | POA: Diagnosis not present

## 2019-05-25 DIAGNOSIS — E039 Hypothyroidism, unspecified: Secondary | ICD-10-CM | POA: Insufficient documentation

## 2019-05-25 DIAGNOSIS — M7062 Trochanteric bursitis, left hip: Secondary | ICD-10-CM | POA: Diagnosis not present

## 2019-05-25 DIAGNOSIS — M5136 Other intervertebral disc degeneration, lumbar region: Secondary | ICD-10-CM | POA: Diagnosis not present

## 2019-05-25 DIAGNOSIS — Z5181 Encounter for therapeutic drug level monitoring: Secondary | ICD-10-CM

## 2019-05-25 DIAGNOSIS — Z79891 Long term (current) use of opiate analgesic: Secondary | ICD-10-CM | POA: Diagnosis not present

## 2019-05-25 DIAGNOSIS — G894 Chronic pain syndrome: Secondary | ICD-10-CM | POA: Diagnosis not present

## 2019-05-25 DIAGNOSIS — R000 Tachycardia, unspecified: Secondary | ICD-10-CM | POA: Insufficient documentation

## 2019-05-25 DIAGNOSIS — I5032 Chronic diastolic (congestive) heart failure: Secondary | ICD-10-CM | POA: Insufficient documentation

## 2019-05-25 DIAGNOSIS — F329 Major depressive disorder, single episode, unspecified: Secondary | ICD-10-CM | POA: Insufficient documentation

## 2019-05-25 DIAGNOSIS — F17210 Nicotine dependence, cigarettes, uncomplicated: Secondary | ICD-10-CM | POA: Insufficient documentation

## 2019-05-25 DIAGNOSIS — M545 Low back pain: Secondary | ICD-10-CM | POA: Insufficient documentation

## 2019-05-25 DIAGNOSIS — F419 Anxiety disorder, unspecified: Secondary | ICD-10-CM | POA: Insufficient documentation

## 2019-05-25 MED ORDER — OXYCODONE-ACETAMINOPHEN 10-325 MG PO TABS: 1.0000 | ORAL_TABLET | Freq: Three times a day (TID) | ORAL | 0 refills | Status: DC | PRN

## 2019-05-25 NOTE — Progress Notes (Signed)
Subjective:    Patient ID: Ashley Houston, female    DOB: [birth year], 79 y.o.   MRN: [id number]  HPI: Ashley Houston is a 79 y.o. female whose appointment was changed to a virtual office visit to reduce the risk of exposure to the COVID-19 virus and to help Ashley Houston remain healthy and safe. The virtual visit will also provide continuity of care. Ashley Houston agrees with tele-health visit and verbalizes understanding. She states her pain is located in her lower back radiating into her left hip and left knee pain. She  Rates her pain 0, at this time.Her current exercise regime is walking.  Ashley Houston Morphine equivalent is 45.00 MME. She is also prescribed Lorazepam by [name]. We have discussed the black box warning of using opioids and benzodiazepines. I highlighted the dangers of using these drugs together and discussed the adverse events including respiratory suppression, overdose, cognitive impairment and importance of compliance with current regimen. We will continue to monitor and adjust as indicated.   Last Oral Swab was Performed on 02/23/2019, it was consistent.   Barbee Shropshire CMA asked the Health and History Questions. This provider and Barbee Shropshire verified we were speaking with the correct person using two identifiers.   Pain Inventory Average Pain 8 Pain Right Now 0 My pain is dull and aching  In the last 24 hours, has pain interfered with the following? General activity 5 Relation with others 5 Enjoyment of life 5 What TIME of day is your pain at its worst? morning Sleep (in general) Good  Pain is worse with: walking, bending, sitting, inactivity, standing and some activites Pain improves with: rest, heat/ice and medication Relief from Meds: 10  Mobility walk without assistance ability to climb steps?  yes do you drive?  yes  Function retired I need assistance with the following:  household duties and shopping  Neuro/Psych bowel control  problems spasms depression anxiety  Prior Studies Any changes since last visit?  no  Physicians involved in your care Any changes since last visit?  no   No family history on file. Social History   Socioeconomic History  . Marital status: Widowed    Spouse name: Not on file  . Number of children: Not on file  . Years of education: Not on file  . Highest education level: Not on file  Occupational History  . Not on file  Tobacco Use  . Smoking status: Current Every Day Smoker    Packs/day: 0.50    Types: Cigarettes  . Smokeless tobacco: Never Used  Substance and Sexual Activity  . Alcohol use: No  . Drug use: No  . Sexual activity: Not on file  Other Topics Concern  . Not on file  Social History Narrative  . Not on file   Social Determinants of Health   Financial Resource Strain:   . Difficulty of Paying Living Expenses: Not on file  Food Insecurity:   . Worried About Programme researcher, broadcasting/film/video in the Last Year: Not on file  . Ran Out of Food in the Last Year: Not on file  Transportation Needs:   . Lack of Transportation (Medical): Not on file  . Lack of Transportation (Non-Medical): Not on file  Physical Activity:   . Days of Exercise per Week: Not on file  . Minutes of Exercise per Session: Not on file  Stress:   . Feeling of Stress : Not on file  Social Connections:   . Frequency of Communication  with Friends and Family: Not on file  . Frequency of Social Gatherings with Friends and Family: Not on file  . Attends Religious Services: Not on file  . Active Member of Clubs or Organizations: Not on file  . Attends Archivist Meetings: Not on file  . Marital Status: Not on file   Past Surgical History:  Procedure Laterality Date  . ABDOMINAL HYSTERECTOMY    . CHOLECYSTECTOMY    . TONSILLECTOMY     Past Medical History:  Diagnosis Date  . Anxiety   . Chronic diastolic heart failure ([place])   . Chronic pain    hips back and shoulders  . COPD (chronic  obstructive pulmonary disease) ([place])   . Depression   . Hypothyroidism 09/08/2015  . Tachycardia   . Thyroid disease    There were no vitals taken for this visit.  Opioid Risk Score:   Fall Risk Score:  `1  Depression screen PHQ 2/9  Depression screen [facility] 2/9 04/01/2018 03/01/2018 02/01/2018 01/08/2018 08/26/2017 07/31/2017 02/03/2017  Decreased Interest 0 0 0 0 0 1 1  Down, Depressed, Hopeless 0 0 0 0 0 1 1  PHQ - 2 Score 0 0 0 0 0 2 2  Altered sleeping - - - - 0 - -  Tired, decreased energy - - - - 0 - -  Change in appetite - - - - 0 - -  Feeling bad or failure about yourself  - - - - 0 - -  Trouble concentrating - - - - 0 - -  Moving slowly or fidgety/restless - - - - 0 - -  Suicidal thoughts - - - - 0 - -  PHQ-9 Score - - - - 0 - -  Difficult doing work/chores - - - - Not difficult at all - -     Review of Systems  Constitutional: Negative.   HENT: Negative.   Eyes: Negative.   Respiratory: Positive for shortness of breath and wheezing.   Cardiovascular: Negative.   Gastrointestinal: Positive for constipation.  Endocrine: Negative.   Genitourinary: Negative.   Musculoskeletal: Positive for arthralgias, back pain and myalgias.  Skin: Negative.   Allergic/Immunologic: Negative.   Neurological: Negative.   Hematological: Negative.   Psychiatric/Behavioral: Positive for dysphoric mood. The patient is nervous/anxious.   All other systems reviewed and are negative.      Objective:   Physical Exam Vitals and nursing note reviewed.  Musculoskeletal:     Comments: No Physical Exam: Virtual Visit           Assessment & Plan:  1.Left Lumbar Radiculitis/Chronic low back pain likely related to lumbar spondylosis and facet arthropathy: Continue HEP as Tolerated.05/25/2019. 2. OA of Left Knee: Refilled Oxycodone 10/325 mg one tablet every 8 hours as needed #90.05/25/2019 We will continue the opioid monitoring program, this consists of regular clinic visits,  examinations, urine drug screen, pill counts as well as use of [place] Controlled Substance reporting System. Continue Voltaren Gel and HEP as tolerated. 3. Muscle Spasms: Continuecurrent medication regimen withFlexeril as needed.05/25/2019 4. Left Greater Trochanter Bursitis: Continue to Alternate with Heat and Ice Therapy. Continue to Monitor.05/25/2019  F/U in 1 month  Telephone Call Location of patient:In her Home Location of provider: Office Established patient Time spent on call:10 Minutes

## 2019-06-10 DIAGNOSIS — I10 Essential (primary) hypertension: Secondary | ICD-10-CM | POA: Diagnosis not present

## 2019-06-10 DIAGNOSIS — E7849 Other hyperlipidemia: Secondary | ICD-10-CM | POA: Diagnosis not present

## 2019-06-14 DIAGNOSIS — J0101 Acute recurrent maxillary sinusitis: Secondary | ICD-10-CM | POA: Diagnosis not present

## 2019-06-22 ENCOUNTER — Encounter: Payer: Medicare HMO | Attending: Physical Medicine & Rehabilitation | Admitting: Registered Nurse

## 2019-06-22 ENCOUNTER — Other Ambulatory Visit: Payer: Self-pay

## 2019-06-22 ENCOUNTER — Encounter: Payer: Self-pay | Admitting: Registered Nurse

## 2019-06-22 VITALS — Ht 66.0 in | Wt 130.0 lb

## 2019-06-22 DIAGNOSIS — F419 Anxiety disorder, unspecified: Secondary | ICD-10-CM | POA: Insufficient documentation

## 2019-06-22 DIAGNOSIS — M5136 Other intervertebral disc degeneration, lumbar region: Secondary | ICD-10-CM

## 2019-06-22 DIAGNOSIS — G8929 Other chronic pain: Secondary | ICD-10-CM | POA: Insufficient documentation

## 2019-06-22 DIAGNOSIS — M5416 Radiculopathy, lumbar region: Secondary | ICD-10-CM

## 2019-06-22 DIAGNOSIS — M7062 Trochanteric bursitis, left hip: Secondary | ICD-10-CM | POA: Diagnosis not present

## 2019-06-22 DIAGNOSIS — G894 Chronic pain syndrome: Secondary | ICD-10-CM

## 2019-06-22 DIAGNOSIS — Z79891 Long term (current) use of opiate analgesic: Secondary | ICD-10-CM | POA: Diagnosis not present

## 2019-06-22 DIAGNOSIS — F329 Major depressive disorder, single episode, unspecified: Secondary | ICD-10-CM | POA: Insufficient documentation

## 2019-06-22 DIAGNOSIS — J449 Chronic obstructive pulmonary disease, unspecified: Secondary | ICD-10-CM | POA: Insufficient documentation

## 2019-06-22 DIAGNOSIS — Z5181 Encounter for therapeutic drug level monitoring: Secondary | ICD-10-CM

## 2019-06-22 DIAGNOSIS — M1712 Unilateral primary osteoarthritis, left knee: Secondary | ICD-10-CM | POA: Insufficient documentation

## 2019-06-22 DIAGNOSIS — M545 Low back pain: Secondary | ICD-10-CM | POA: Insufficient documentation

## 2019-06-22 DIAGNOSIS — F17210 Nicotine dependence, cigarettes, uncomplicated: Secondary | ICD-10-CM | POA: Insufficient documentation

## 2019-06-22 DIAGNOSIS — R000 Tachycardia, unspecified: Secondary | ICD-10-CM | POA: Insufficient documentation

## 2019-06-22 DIAGNOSIS — I5032 Chronic diastolic (congestive) heart failure: Secondary | ICD-10-CM | POA: Insufficient documentation

## 2019-06-22 DIAGNOSIS — E039 Hypothyroidism, unspecified: Secondary | ICD-10-CM | POA: Insufficient documentation

## 2019-06-22 MED ORDER — OXYCODONE-ACETAMINOPHEN 10-325 MG PO TABS: 1.0000 | ORAL_TABLET | Freq: Three times a day (TID) | ORAL | 0 refills | Status: DC | PRN

## 2019-06-22 NOTE — Progress Notes (Signed)
Subjective:    Patient ID: Ashley Houston, female    DOB: [birth year], 79 y.o.   MRN: [id number]  HPI: Kensie Susman is a 79 y.o. female whose appointment was changed to a virtual office visit to reduce the risk of exposure to the COVID-19 virus and to help Ms. Criswell remain healthy and safe. The virtual visit will also provide continuity of care. Ms. Blumenthal agrees with virtual visit and verbalizes understanding. She states her pain is located in her lower back radiating into her left hip and left lower extremity. Also reports left knee pain. She rates her pain 0 at this time. Her current exercise regime is walking and performing stretching exercises.  Ms. Kirschbaum Morphine equivalent is 45.00  MME.  Last Oral Swab was Performed on 02/23/2019, it was consistent.   Marland Mcalpine CMA asked the Health and History Questions. This provider and Marland Mcalpine verified we were speaking with the correct person using two identifiers.   Pain Inventory Average Pain 8 Pain Right Now 0 My pain is dull and aching  In the last 24 hours, has pain interfered with the following? General activity 5 Relation with others 5 Enjoyment of life 5 What TIME of day is your pain at its worst? morning Sleep (in general) Good  Pain is worse with: walking, bending, sitting, inactivity, standing and some activites Pain improves with: rest and medication Relief from Meds: 10  Mobility walk without assistance ability to climb steps?  yes do you drive?  yes  Function retired I need assistance with the following:  meal prep, household duties and shopping  Neuro/Psych spasms depression anxiety  Prior Studies Any changes since last visit?  no  Physicians involved in your care Any changes since last visit?  no   No family history on file. Social History   Socioeconomic History  . Marital status: Widowed    Spouse name: Not on file  . Number of children: Not on file  . Years of education: Not on  file  . Highest education level: Not on file  Occupational History  . Not on file  Tobacco Use  . Smoking status: Current Every Day Smoker    Packs/day: 0.50    Types: Cigarettes  . Smokeless tobacco: Never Used  Substance and Sexual Activity  . Alcohol use: No  . Drug use: No  . Sexual activity: Not on file  Other Topics Concern  . Not on file  Social History Narrative  . Not on file   Social Determinants of Health   Financial Resource Strain:   . Difficulty of Paying Living Expenses: Not on file  Food Insecurity:   . Worried About Charity fundraiser in the Last Year: Not on file  . Ran Out of Food in the Last Year: Not on file  Transportation Needs:   . Lack of Transportation (Medical): Not on file  . Lack of Transportation (Non-Medical): Not on file  Physical Activity:   . Days of Exercise per Week: Not on file  . Minutes of Exercise per Session: Not on file  Stress:   . Feeling of Stress : Not on file  Social Connections:   . Frequency of Communication with Friends and Family: Not on file  . Frequency of Social Gatherings with Friends and Family: Not on file  . Attends Religious Services: Not on file  . Active Member of Clubs or Organizations: Not on file  . Attends Archivist Meetings: Not on file  .  Marital Status: Not on file   Past Surgical History:  Procedure Laterality Date  . ABDOMINAL HYSTERECTOMY    . CHOLECYSTECTOMY    . TONSILLECTOMY     Past Medical History:  Diagnosis Date  . Anxiety   . Chronic diastolic heart failure ([facility])   . Chronic pain    hips back and shoulders  . COPD (chronic obstructive pulmonary disease) ([facility])   . Depression   . Hypothyroidism 09/08/2015  . Tachycardia   . Thyroid disease    There were no vitals taken for this visit.  Opioid Risk Score:   Fall Risk Score:  `1  Depression screen PHQ 2/9  Depression screen [facility] 2/9 04/01/2018 03/01/2018 02/01/2018 01/08/2018 08/26/2017 07/31/2017 02/03/2017  Decreased  Interest 0 0 0 0 0 1 1  Down, Depressed, Hopeless 0 0 0 0 0 1 1  PHQ - 2 Score 0 0 0 0 0 2 2  Altered sleeping - - - - 0 - -  Tired, decreased energy - - - - 0 - -  Change in appetite - - - - 0 - -  Feeling bad or failure about yourself  - - - - 0 - -  Trouble concentrating - - - - 0 - -  Moving slowly or fidgety/restless - - - - 0 - -  Suicidal thoughts - - - - 0 - -  PHQ-9 Score - - - - 0 - -  Difficult doing work/chores - - - - Not difficult at all - -     Review of Systems  Constitutional: Negative.   HENT: Negative.   Eyes: Negative.   Respiratory: Negative.   Cardiovascular: Negative.   Gastrointestinal: Negative.   Endocrine: Negative.   Genitourinary: Negative.   Musculoskeletal: Positive for arthralgias, back pain and myalgias.  Skin: Negative.   Allergic/Immunologic: Negative.   Neurological: Negative.   Hematological: Negative.   Psychiatric/Behavioral: Positive for dysphoric mood. The patient is nervous/anxious.   All other systems reviewed and are negative.      Objective:   Physical Exam Vitals and nursing note reviewed.  Musculoskeletal:     Comments: No Physical Exam: Virtual Visit           Assessment & Plan:  1.Left Lumbar Radiculitis/Chronic low back pain likely related to lumbar spondylosis and facet arthropathy: Continue HEP as Tolerated.06/22/2019. 2. OA of Left Knee: Refilled Oxycodone 10/325 mg one tablet every 8 hours as needed #90.06/22/2019 We will continue the opioid monitoring program, this consists of regular clinic visits, examinations, urine drug screen, pill counts as well as use of [place] Controlled Substance reporting System. Continue Voltaren Gel and HEP as tolerated. 3. Muscle Spasms: Continuecurrent medication regimen withFlexeril as needed.06/22/2019 4. Left Greater Trochanter Bursitis: Continue to Alternate with Heat and Ice Therapy. Continue to Monitor.06/22/2019  F/U in 1 month  Telephone  Call Location of patient:In her Home Location of provider: Office Established patient Time spent on call:10 Minutes

## 2019-07-07 DIAGNOSIS — R011 Cardiac murmur, unspecified: Secondary | ICD-10-CM | POA: Diagnosis not present

## 2019-07-07 DIAGNOSIS — F411 Generalized anxiety disorder: Secondary | ICD-10-CM | POA: Diagnosis not present

## 2019-07-07 DIAGNOSIS — M545 Low back pain: Secondary | ICD-10-CM | POA: Diagnosis not present

## 2019-07-07 DIAGNOSIS — Z6821 Body mass index (BMI) 21.0-21.9, adult: Secondary | ICD-10-CM | POA: Diagnosis not present

## 2019-07-07 DIAGNOSIS — I10 Essential (primary) hypertension: Secondary | ICD-10-CM | POA: Diagnosis not present

## 2019-07-07 DIAGNOSIS — M25562 Pain in left knee: Secondary | ICD-10-CM | POA: Diagnosis not present

## 2019-07-07 DIAGNOSIS — E782 Mixed hyperlipidemia: Secondary | ICD-10-CM | POA: Diagnosis not present

## 2019-07-07 DIAGNOSIS — J449 Chronic obstructive pulmonary disease, unspecified: Secondary | ICD-10-CM | POA: Diagnosis not present

## 2019-07-08 DIAGNOSIS — F17210 Nicotine dependence, cigarettes, uncomplicated: Secondary | ICD-10-CM | POA: Diagnosis not present

## 2019-07-08 DIAGNOSIS — E039 Hypothyroidism, unspecified: Secondary | ICD-10-CM | POA: Diagnosis not present

## 2019-07-08 DIAGNOSIS — J449 Chronic obstructive pulmonary disease, unspecified: Secondary | ICD-10-CM | POA: Diagnosis not present

## 2019-07-08 DIAGNOSIS — I10 Essential (primary) hypertension: Secondary | ICD-10-CM | POA: Diagnosis not present

## 2019-07-20 ENCOUNTER — Encounter: Payer: Self-pay | Admitting: Registered Nurse

## 2019-07-20 ENCOUNTER — Other Ambulatory Visit: Payer: Self-pay

## 2019-07-20 ENCOUNTER — Encounter: Payer: Medicare HMO | Attending: Physical Medicine & Rehabilitation | Admitting: Registered Nurse

## 2019-07-20 VITALS — BP 113/72 | HR 68 | Temp 97.5°F | Ht 66.0 in | Wt 130.0 lb

## 2019-07-20 DIAGNOSIS — M5136 Other intervertebral disc degeneration, lumbar region: Secondary | ICD-10-CM | POA: Diagnosis not present

## 2019-07-20 DIAGNOSIS — I5032 Chronic diastolic (congestive) heart failure: Secondary | ICD-10-CM | POA: Insufficient documentation

## 2019-07-20 DIAGNOSIS — G894 Chronic pain syndrome: Secondary | ICD-10-CM

## 2019-07-20 DIAGNOSIS — F419 Anxiety disorder, unspecified: Secondary | ICD-10-CM | POA: Diagnosis not present

## 2019-07-20 DIAGNOSIS — G8929 Other chronic pain: Secondary | ICD-10-CM | POA: Diagnosis not present

## 2019-07-20 DIAGNOSIS — E039 Hypothyroidism, unspecified: Secondary | ICD-10-CM | POA: Diagnosis not present

## 2019-07-20 DIAGNOSIS — M7062 Trochanteric bursitis, left hip: Secondary | ICD-10-CM | POA: Diagnosis not present

## 2019-07-20 DIAGNOSIS — M5416 Radiculopathy, lumbar region: Secondary | ICD-10-CM | POA: Diagnosis not present

## 2019-07-20 DIAGNOSIS — F17210 Nicotine dependence, cigarettes, uncomplicated: Secondary | ICD-10-CM | POA: Diagnosis not present

## 2019-07-20 DIAGNOSIS — F329 Major depressive disorder, single episode, unspecified: Secondary | ICD-10-CM | POA: Diagnosis not present

## 2019-07-20 DIAGNOSIS — R000 Tachycardia, unspecified: Secondary | ICD-10-CM | POA: Insufficient documentation

## 2019-07-20 DIAGNOSIS — Z5181 Encounter for therapeutic drug level monitoring: Secondary | ICD-10-CM

## 2019-07-20 DIAGNOSIS — J449 Chronic obstructive pulmonary disease, unspecified: Secondary | ICD-10-CM | POA: Insufficient documentation

## 2019-07-20 DIAGNOSIS — M1712 Unilateral primary osteoarthritis, left knee: Secondary | ICD-10-CM | POA: Insufficient documentation

## 2019-07-20 DIAGNOSIS — Z79891 Long term (current) use of opiate analgesic: Secondary | ICD-10-CM

## 2019-07-20 DIAGNOSIS — M545 Low back pain: Secondary | ICD-10-CM | POA: Diagnosis not present

## 2019-07-20 MED ORDER — OXYCODONE-ACETAMINOPHEN 10-325 MG PO TABS: 1.0000 | ORAL_TABLET | Freq: Three times a day (TID) | ORAL | 0 refills | Status: DC | PRN

## 2019-07-20 NOTE — Progress Notes (Signed)
Subjective:    Patient ID: Ashley Houston, female    DOB: 10/22/1940, 79 y.o.   MRN: [id number]  HPI: Ashley Houston is a 79 y.o. female who returns for follow up appointment for chronic pain and medication refill. She states her pain is located in her lower back radiating into her left hip and left lower extremity. At times she reports left knee pain. She rates her  Pain 8. Her current exercise regime is walking and performing stretching exercises.  Ashley Houston Morphine equivalent is 45.00  MME. She is also prescribed Lorazepam by [name]. We have discussed the black box warning of using opioids and benzodiazepines. I highlighted the dangers of using these drugs together and discussed the adverse events including respiratory suppression, overdose, cognitive impairment and importance of compliance with current regimen. We will continue to monitor and adjust as indicated.    Last Oral Swab was Performed on 02/23/2019, it was consistent.    Pain Inventory Average Pain 8 Pain Right Now 8 My pain is dull and aching  In the last 24 hours, has pain interfered with the following? General activity 0 Relation with others 0 Enjoyment of life 0 What TIME of day is your pain at its worst? morning, evening Sleep (in general) Good  Pain is worse with: bending, sitting and standing Pain improves with: rest, heat/ice, pacing activities and medication Relief from Meds: 10  Mobility walk without assistance do you drive?  yes  Function I need assistance with the following:  meal prep, household duties and shopping  Neuro/Psych spasms depression anxiety  Prior Studies Any changes since last visit?  no  Physicians involved in your care Any changes since last visit?  no   No family history on file. Social History   Socioeconomic History  . Marital status: Widowed    Spouse name: Not on file  . Number of children: Not on file  . Years of education: Not on file  .  Highest education level: Not on file  Occupational History  . Not on file  Tobacco Use  . Smoking status: Current Every Day Smoker    Packs/day: 0.50    Types: Cigarettes  . Smokeless tobacco: Never Used  Substance and Sexual Activity  . Alcohol use: No  . Drug use: No  . Sexual activity: Not on file  Other Topics Concern  . Not on file  Social History Narrative  . Not on file   Social Determinants of Health   Financial Resource Strain:   . Difficulty of Paying Living Expenses: Not on file  Food Insecurity:   . Worried About Programme researcher, broadcasting/film/video in the Last Year: Not on file  . Ran Out of Food in the Last Year: Not on file  Transportation Needs:   . Lack of Transportation (Medical): Not on file  . Lack of Transportation (Non-Medical): Not on file  Physical Activity:   . Days of Exercise per Week: Not on file  . Minutes of Exercise per Session: Not on file  Stress:   . Feeling of Stress : Not on file  Social Connections:   . Frequency of Communication with Friends and Family: Not on file  . Frequency of Social Gatherings with Friends and Family: Not on file  . Attends Religious Services: Not on file  . Active Member of Clubs or Organizations: Not on file  . Attends Banker Meetings: Not on file  . Marital Status: Not on file  Past Surgical History:  Procedure Laterality Date  . ABDOMINAL HYSTERECTOMY    . CHOLECYSTECTOMY    . TONSILLECTOMY     Past Medical History:  Diagnosis Date  . Anxiety   . Chronic diastolic heart failure (HCC)   . Chronic pain    hips back and shoulders  . COPD (chronic obstructive pulmonary disease) (HCC)   . Depression   . Hypothyroidism 09/08/2015  . Tachycardia   . Thyroid disease    There were no vitals taken for this visit.  Opioid Risk Score:   Fall Risk Score:  `1  Depression screen PHQ 2/9  Depression screen [facility] 2/9 04/01/2018 03/01/2018 02/01/2018 01/08/2018 08/26/2017 07/31/2017 02/03/2017  Decreased Interest 0  0 0 0 0 1 1  Down, Depressed, Hopeless 0 0 0 0 0 1 1  PHQ - 2 Score 0 0 0 0 0 2 2  Altered sleeping - - - - 0 - -  Tired, decreased energy - - - - 0 - -  Change in appetite - - - - 0 - -  Feeling bad or failure about yourself  - - - - 0 - -  Trouble concentrating - - - - 0 - -  Moving slowly or fidgety/restless - - - - 0 - -  Suicidal thoughts - - - - 0 - -  PHQ-9 Score - - - - 0 - -  Difficult doing work/chores - - - - Not difficult at all - -    Review of Systems  Constitutional: Negative.   HENT: Negative.   Eyes: Negative.   Respiratory: Negative.   Cardiovascular: Negative.   Gastrointestinal: Negative.   Endocrine: Negative.   Genitourinary: Negative.   Musculoskeletal: Positive for arthralgias and back pain.       Spasms   Skin: Negative.   Hematological: Negative.   Psychiatric/Behavioral: Positive for dysphoric mood. The patient is nervous/anxious.   All other systems reviewed and are negative.      Objective:   Physical Exam Vitals and nursing note reviewed.  Constitutional:      Appearance: Normal appearance.  Cardiovascular:     Rate and Rhythm: Normal rate and regular rhythm.     Pulses: Normal pulses.     Heart sounds: Normal heart sounds.  Pulmonary:     Effort: Pulmonary effort is normal.     Breath sounds: Normal breath sounds.  Musculoskeletal:     Cervical back: Normal range of motion and neck supple.     Comments: Normal Muscle Bulk and Muscle Testing Reveals:  Upper Extremities: Full ROM and Muscle Strength 5/5 Lumbar Paraspinal Tenderness: L-3-L-5 Left Greater Trochanteric tenderness Lower Extremities: Full ROM and Muscle Strength 5/5 Arises from table with ease Narrow Based Gait   Skin:    General: Skin is warm and dry.  Neurological:     Mental Status: She is alert and oriented to person, place, and time.  Psychiatric:        Mood and Affect: Mood normal.        Behavior: Behavior normal.           Assessment & Plan:  1.Left  Lumbar Radiculitis/Chronic low back pain likely related to lumbar spondylosis and facet arthropathy: Continue HEP as Tolerated.07/20/2019. 2. OA of Left Knee: Refilled Oxycodone 10/325 mg one tablet every 8 hours as needed #90.07/20/2019 We will continue the opioid monitoring program, this consists of regular clinic visits, examinations, urine drug screen, pill counts as well as use of [place] Controlled Substance reporting System. Continue Voltaren Gel and HEP as tolerated. 3. Muscle Spasms: Continuecurrent medication regimen withFlexeril as needed.07/20/2019 4. Left Greater Trochanter Bursitis: Continue to Alternate with Heat and Ice Therapy. Continue to Monitor.07/20/2019  F/U in 1 month  15 minutes of face to face patient care time was spent during this visit. All questions were encouraged and answered.

## 2019-08-10 DIAGNOSIS — I10 Essential (primary) hypertension: Secondary | ICD-10-CM | POA: Diagnosis not present

## 2019-08-10 DIAGNOSIS — E7849 Other hyperlipidemia: Secondary | ICD-10-CM | POA: Diagnosis not present

## 2019-08-17 ENCOUNTER — Other Ambulatory Visit: Payer: Self-pay

## 2019-08-17 ENCOUNTER — Encounter: Payer: Medicare HMO | Attending: Physical Medicine & Rehabilitation | Admitting: Registered Nurse

## 2019-08-17 DIAGNOSIS — J449 Chronic obstructive pulmonary disease, unspecified: Secondary | ICD-10-CM | POA: Insufficient documentation

## 2019-08-17 DIAGNOSIS — M1712 Unilateral primary osteoarthritis, left knee: Secondary | ICD-10-CM | POA: Diagnosis not present

## 2019-08-17 DIAGNOSIS — Z5181 Encounter for therapeutic drug level monitoring: Secondary | ICD-10-CM

## 2019-08-17 DIAGNOSIS — M5416 Radiculopathy, lumbar region: Secondary | ICD-10-CM | POA: Diagnosis not present

## 2019-08-17 DIAGNOSIS — Z79891 Long term (current) use of opiate analgesic: Secondary | ICD-10-CM

## 2019-08-17 DIAGNOSIS — R000 Tachycardia, unspecified: Secondary | ICD-10-CM | POA: Insufficient documentation

## 2019-08-17 DIAGNOSIS — G894 Chronic pain syndrome: Secondary | ICD-10-CM

## 2019-08-17 DIAGNOSIS — G8929 Other chronic pain: Secondary | ICD-10-CM | POA: Insufficient documentation

## 2019-08-17 DIAGNOSIS — I5032 Chronic diastolic (congestive) heart failure: Secondary | ICD-10-CM | POA: Insufficient documentation

## 2019-08-17 DIAGNOSIS — M545 Low back pain: Secondary | ICD-10-CM | POA: Insufficient documentation

## 2019-08-17 DIAGNOSIS — E039 Hypothyroidism, unspecified: Secondary | ICD-10-CM | POA: Insufficient documentation

## 2019-08-17 DIAGNOSIS — M5136 Other intervertebral disc degeneration, lumbar region: Secondary | ICD-10-CM

## 2019-08-17 DIAGNOSIS — M7062 Trochanteric bursitis, left hip: Secondary | ICD-10-CM | POA: Diagnosis not present

## 2019-08-17 DIAGNOSIS — F419 Anxiety disorder, unspecified: Secondary | ICD-10-CM | POA: Insufficient documentation

## 2019-08-17 DIAGNOSIS — F17210 Nicotine dependence, cigarettes, uncomplicated: Secondary | ICD-10-CM | POA: Insufficient documentation

## 2019-08-17 DIAGNOSIS — F329 Major depressive disorder, single episode, unspecified: Secondary | ICD-10-CM | POA: Insufficient documentation

## 2019-08-17 MED ORDER — OXYCODONE-ACETAMINOPHEN 10-325 MG PO TABS: 1.0000 | ORAL_TABLET | Freq: Three times a day (TID) | ORAL | 0 refills | Status: DC | PRN

## 2019-08-17 NOTE — Progress Notes (Signed)
Subjective:    Patient ID: Ashley Houston, female    DOB: 15-Jan-1941, 79 y.o.   MRN: [id number]  HPI: Ashley Houston is a 79 y.o. female whose appointment was changed to a virtual visit, she called office with complaints of feeling weakness, she didn't call her PCP. She was instructed to call her PCP she verbalizes understanding. She agree with the virtual visit and she verbalizes understanding. She states her pain is located in her lower back radiating into her left hip and left lower extremity. Also reports left knee pain. She rates her pain today 0, she states she had her medication ( oxycodone) this morning. Her current exercise regime is walking.  Ms. Slape Morphine equivalent is 45.00 MME. She  is also prescribed Lorazepam by [name]. We have discussed the black box warning of using opioids and benzodiazepines. I highlighted the dangers of using these drugs together and discussed the adverse events including respiratory suppression, overdose, cognitive impairment and importance of compliance with current regimen. We will continue to monitor and adjust as indicated.     Last Oral Swab was Performed on 02/23/2019, it was consistent.   Pain Inventory Average Pain 8 Pain Right Now 0 My pain is dull and aching  In the last 24 hours, has pain interfered with the following? General activity 0 Relation with others 0 Enjoyment of life 0 What TIME of day is your pain at its worst? morning and evening Sleep (in general) Good  Pain is worse with: bending, sitting and standing Pain improves with: rest, heat/ice, pacing activities and medication Relief from Meds: 10  Mobility walk without assistance do you drive?  yes  Function I need assistance with the following:  meal prep, household duties and shopping  Neuro/Psych spasms depression anxiety  Prior Studies Any changes since last visit?  no  Physicians involved in your care Dentist   No family history on  file. Social History   Socioeconomic History  . Marital status: Widowed    Spouse name: Not on file  . Number of children: Not on file  . Years of education: Not on file  . Highest education level: Not on file  Occupational History  . Not on file  Tobacco Use  . Smoking status: Current Every Day Smoker    Packs/day: 0.50    Types: Cigarettes  . Smokeless tobacco: Never Used  Substance and Sexual Activity  . Alcohol use: No  . Drug use: No  . Sexual activity: Not on file  Other Topics Concern  . Not on file  Social History Narrative  . Not on file   Social Determinants of Health   Financial Resource Strain:   . Difficulty of Paying Living Expenses:   Food Insecurity:   . Worried About Programme researcher, broadcasting/film/video in the Last Year:   . Barista in the Last Year:   Transportation Needs:   . Freight forwarder (Medical):   Marland Kitchen Lack of Transportation (Non-Medical):   Physical Activity:   . Days of Exercise per Week:   . Minutes of Exercise per Session:   Stress:   . Feeling of Stress :   Social Connections:   . Frequency of Communication with Friends and Family:   . Frequency of Social Gatherings with Friends and Family:   . Attends Religious Services:   . Active Member of Clubs or Organizations:   . Attends Banker Meetings:   Marland Kitchen Marital Status:  Past Surgical History:  Procedure Laterality Date  . ABDOMINAL HYSTERECTOMY    . CHOLECYSTECTOMY    . TONSILLECTOMY     Past Medical History:  Diagnosis Date  . Anxiety   . Chronic diastolic heart failure ([place])   . Chronic pain    hips back and shoulders  . COPD (chronic obstructive pulmonary disease) ([place])   . Depression   . Hypothyroidism 09/08/2015  . Tachycardia   . Thyroid disease    There were no vitals taken for this visit.  Opioid Risk Score:   Fall Risk Score:  `1  Depression screen PHQ 2/9  Depression screen [facility] 2/9 04/01/2018 03/01/2018 02/01/2018 01/08/2018 08/26/2017 07/31/2017  02/03/2017  Decreased Interest 0 0 0 0 0 1 1  Down, Depressed, Hopeless 0 0 0 0 0 1 1  PHQ - 2 Score 0 0 0 0 0 2 2  Altered sleeping - - - - 0 - -  Tired, decreased energy - - - - 0 - -  Change in appetite - - - - 0 - -  Feeling bad or failure about yourself  - - - - 0 - -  Trouble concentrating - - - - 0 - -  Moving slowly or fidgety/restless - - - - 0 - -  Suicidal thoughts - - - - 0 - -  PHQ-9 Score - - - - 0 - -  Difficult doing work/chores - - - - Not difficult at all - -    Review of Systems     Objective:   Physical Exam Vitals and nursing note reviewed.  Musculoskeletal:     Comments: No Physical Exam Performed: Virtual Visit           Assessment & Plan:  1.Left Lumbar Radiculitis/Chronic low back pain likely related to lumbar spondylosis and facet arthropathy: Continue HEP as Tolerated.08/17/2019. 2. OA of Left Knee: Refilled Oxycodone 10/325 mg one tablet every 8 hours as needed #90.08/17/2019 We will continue the opioid monitoring program, this consists of regular clinic visits, examinations, urine drug screen, pill counts as well as use of [place] Controlled Substance reporting System. Continue Voltaren Gel and HEP as tolerated. 3. Muscle Spasms: Continuecurrent medication regimen withFlexeril as needed.08/17/2019 4. Left Greater Trochanter Bursitis: Continue to Alternate with Heat and Ice Therapy. Continue to Monitor.08/17/2019  F/U in 1 month  Telephone Call Established Patient Location of Patient: In her Home Location of Provider: In the Office Total Time Spent: 10 Minutes

## 2019-08-21 ENCOUNTER — Encounter: Payer: Self-pay | Admitting: Registered Nurse

## 2019-08-30 DIAGNOSIS — H9201 Otalgia, right ear: Secondary | ICD-10-CM | POA: Diagnosis not present

## 2019-09-09 DIAGNOSIS — E039 Hypothyroidism, unspecified: Secondary | ICD-10-CM | POA: Diagnosis not present

## 2019-09-09 DIAGNOSIS — I10 Essential (primary) hypertension: Secondary | ICD-10-CM | POA: Diagnosis not present

## 2019-09-09 DIAGNOSIS — E7849 Other hyperlipidemia: Secondary | ICD-10-CM | POA: Diagnosis not present

## 2019-09-21 DIAGNOSIS — Z6821 Body mass index (BMI) 21.0-21.9, adult: Secondary | ICD-10-CM | POA: Diagnosis not present

## 2019-09-21 DIAGNOSIS — M545 Low back pain: Secondary | ICD-10-CM | POA: Diagnosis not present

## 2019-09-21 DIAGNOSIS — R531 Weakness: Secondary | ICD-10-CM | POA: Diagnosis not present

## 2019-09-22 DIAGNOSIS — I5040 Unspecified combined systolic (congestive) and diastolic (congestive) heart failure: Secondary | ICD-10-CM | POA: Diagnosis not present

## 2019-09-22 DIAGNOSIS — J449 Chronic obstructive pulmonary disease, unspecified: Secondary | ICD-10-CM | POA: Diagnosis not present

## 2019-09-28 ENCOUNTER — Other Ambulatory Visit: Payer: Self-pay

## 2019-09-28 ENCOUNTER — Encounter: Payer: Medicare HMO | Attending: Physical Medicine & Rehabilitation | Admitting: Registered Nurse

## 2019-09-28 ENCOUNTER — Encounter: Payer: Self-pay | Admitting: Registered Nurse

## 2019-09-28 VITALS — BP 116/83 | HR 73 | Temp 98.2°F | Ht 66.0 in | Wt 128.4 lb

## 2019-09-28 DIAGNOSIS — F329 Major depressive disorder, single episode, unspecified: Secondary | ICD-10-CM | POA: Diagnosis not present

## 2019-09-28 DIAGNOSIS — M5136 Other intervertebral disc degeneration, lumbar region: Secondary | ICD-10-CM | POA: Diagnosis not present

## 2019-09-28 DIAGNOSIS — G8929 Other chronic pain: Secondary | ICD-10-CM | POA: Insufficient documentation

## 2019-09-28 DIAGNOSIS — R000 Tachycardia, unspecified: Secondary | ICD-10-CM | POA: Insufficient documentation

## 2019-09-28 DIAGNOSIS — F17210 Nicotine dependence, cigarettes, uncomplicated: Secondary | ICD-10-CM | POA: Diagnosis not present

## 2019-09-28 DIAGNOSIS — M5416 Radiculopathy, lumbar region: Secondary | ICD-10-CM | POA: Diagnosis not present

## 2019-09-28 DIAGNOSIS — J449 Chronic obstructive pulmonary disease, unspecified: Secondary | ICD-10-CM | POA: Insufficient documentation

## 2019-09-28 DIAGNOSIS — M7062 Trochanteric bursitis, left hip: Secondary | ICD-10-CM | POA: Diagnosis not present

## 2019-09-28 DIAGNOSIS — I5032 Chronic diastolic (congestive) heart failure: Secondary | ICD-10-CM | POA: Diagnosis not present

## 2019-09-28 DIAGNOSIS — M545 Low back pain: Secondary | ICD-10-CM | POA: Insufficient documentation

## 2019-09-28 DIAGNOSIS — F419 Anxiety disorder, unspecified: Secondary | ICD-10-CM | POA: Insufficient documentation

## 2019-09-28 DIAGNOSIS — G894 Chronic pain syndrome: Secondary | ICD-10-CM | POA: Diagnosis not present

## 2019-09-28 DIAGNOSIS — Z5181 Encounter for therapeutic drug level monitoring: Secondary | ICD-10-CM | POA: Diagnosis not present

## 2019-09-28 DIAGNOSIS — Z79891 Long term (current) use of opiate analgesic: Secondary | ICD-10-CM

## 2019-09-28 DIAGNOSIS — M1712 Unilateral primary osteoarthritis, left knee: Secondary | ICD-10-CM

## 2019-09-28 DIAGNOSIS — E039 Hypothyroidism, unspecified: Secondary | ICD-10-CM | POA: Diagnosis not present

## 2019-09-28 MED ORDER — OXYCODONE-ACETAMINOPHEN 10-325 MG PO TABS: 1.0000 | ORAL_TABLET | Freq: Three times a day (TID) | ORAL | 0 refills | Status: DC | PRN

## 2019-09-28 NOTE — Progress Notes (Signed)
Subjective:    Patient ID: Ashley Houston, female    DOB: 12-11-1940, 79 y.o.   MRN: [id number]  HPI: Ashley Houston is a 79 y.o. female who returns for follow up appointment for chronic pain and medication refill. She states her pain is located in her lower back radiating into her left hip and left lower extremities. Also reports left knee pain. Ashley Houston reports over the last three weeks she was experiencing increase intensity of lower back pain and her bilateral lower extremities will give way and she would lower herself to the floor, she denies falling. Also states she was seen by her PCP and she's scheduled for a  MRI she reports. PCP following. Ashley Houston was instructed to use her cane at all times, she verbalizes understanding.   She rates her pain 10. Her current exercise regime is walking and performing stretching exercises.  Ashley Houston Morphine equivalent is 45.00  MME. She  is also prescribed Lorazepam by Ashley Houston. We have discussed the black box warning of using opioids and benzodiazepines. I highlighted the dangers of using these drugs together and discussed the adverse events including respiratory suppression, overdose, cognitive impairment and importance of compliance with current regimen. We will continue to monitor and adjust as indicated.     Oral Swab was Performed Today.   Pain Inventory Average Pain 10 Pain Right Now 10 My pain is dull and aching  In the last 24 hours, has pain interfered with the following? General activity 0 Relation with others 0 Enjoyment of life 0 What TIME of day is your pain at its worst? morning daytime evening Sleep (in general) Good  Pain is worse with: walking, bending and standing Pain improves with: rest, heat/ice, pacing activities and medication Relief from Meds: 9  Mobility ability to climb steps?  yes do you drive?  yes  Function I need assistance with the following:  meal prep, household duties and  shopping  Neuro/Psych spasms depression anxiety  Prior Studies Any changes since last visit?  no  Physicians involved in your care Primary care .   No family history on file. Social History   Socioeconomic History  . Marital status: Widowed    Spouse name: Not on file  . Number of children: Not on file  . Years of education: Not on file  . Highest education level: Not on file  Occupational History  . Not on file  Tobacco Use  . Smoking status: Current Every Day Smoker    Packs/day: 0.50    Types: Cigarettes  . Smokeless tobacco: Never Used  Substance and Sexual Activity  . Alcohol use: No  . Drug use: No  . Sexual activity: Not on file  Other Topics Concern  . Not on file  Social History Narrative  . Not on file   Social Determinants of Health   Financial Resource Strain:   . Difficulty of Paying Living Expenses:   Food Insecurity:   . Worried About Programme researcher, broadcasting/film/video in the Last Year:   . Barista in the Last Year:   Transportation Needs:   . Freight forwarder (Medical):   Marland Kitchen Lack of Transportation (Non-Medical):   Physical Activity:   . Days of Exercise per Week:   . Minutes of Exercise per Session:   Stress:   . Feeling of Stress :   Social Connections:   . Frequency of Communication with Friends and Family:   . Frequency of Social  Gatherings with Friends and Family:   . Attends Religious Services:   . Active Member of Clubs or Organizations:   . Attends Archivist Meetings:   Marland Kitchen Marital Status:    Past Surgical History:  Procedure Laterality Date  . ABDOMINAL HYSTERECTOMY    . CHOLECYSTECTOMY    . TONSILLECTOMY     Past Medical History:  Diagnosis Date  . Anxiety   . Chronic diastolic heart failure ([place])   . Chronic pain    hips back and shoulders  . COPD (chronic obstructive pulmonary disease) ([place])   . Depression   . Hypothyroidism 09/08/2015  . Tachycardia   . Thyroid disease    BP 116/83   Pulse 73   Temp  98.2 F (36.8 C)   Ht 5\' 6"  (1.676 m)   Wt 128 lb 6.4 oz (58.2 kg)   SpO2 92%   BMI 20.72 kg/m   Opioid Risk Score:   Fall Risk Score:  `1  Depression screen PHQ 2/9  Depression screen [facility] 2/9 04/01/2018 03/01/2018 02/01/2018 01/08/2018 08/26/2017 07/31/2017 02/03/2017  Decreased Interest 0 0 0 0 0 1 1  Down, Depressed, Hopeless 0 0 0 0 0 1 1  PHQ - 2 Score 0 0 0 0 0 2 2  Altered sleeping - - - - 0 - -  Tired, decreased energy - - - - 0 - -  Change in appetite - - - - 0 - -  Feeling bad or failure about yourself  - - - - 0 - -  Trouble concentrating - - - - 0 - -  Moving slowly or fidgety/restless - - - - 0 - -  Suicidal thoughts - - - - 0 - -  PHQ-9 Score - - - - 0 - -  Difficult doing work/chores - - - - Not difficult at all - -    Review of Systems  Respiratory: Positive for shortness of breath.   Gastrointestinal: Positive for constipation.  Neurological:       Spasms  Psychiatric/Behavioral: Positive for dysphoric mood. The patient is nervous/anxious.   All other systems reviewed and are negative.      Objective:   Physical Exam Vitals and nursing note reviewed.  Constitutional:      Appearance: Normal appearance.  Cardiovascular:     Rate and Rhythm: Normal rate and regular rhythm.     Pulses: Normal pulses.     Heart sounds: Normal heart sounds.  Pulmonary:     Effort: Pulmonary effort is normal.     Breath sounds: Normal breath sounds.  Musculoskeletal:     Cervical back: Normal range of motion and neck supple.     Comments: Normal Muscle Bulk and Muscle Testing Reveals:  Upper Extremities: Full ROM and Muscle Strength 5/5 Lumbar Paraspinal Tenderness: L-3-L-5 Left Greater Trochanter Tenderness Lower Extremities: Full ROM and Muscle Strength 5/5 Arises from Table with ease Narrow Based Gait   Skin:    General: Skin is warm and dry.  Neurological:     Mental Status: She is alert and oriented to person, place, and time.  Psychiatric:        Mood and  Affect: Mood normal.        Behavior: Behavior normal.           Assessment & Plan:  1.Left Lumbar Radiculitis/Chronic low back pain likely related to lumbar spondylosis and facet arthropathy: Continue HEP as Tolerated.09/28/2019. 2. OA of Left Knee: Refilled Oxycodone 10/325 mg  one tablet every 8 hours as needed #90.09/28/2019 We will continue the opioid monitoring program, this consists of regular clinic visits, examinations, urine drug screen, pill counts as well as use of [place] Controlled Substance reporting System. Continue Voltaren Gel and HEP as tolerated. 3. Muscle Spasms: Continuecurrent medication regimen withFlexeril as needed.09/28/2019 4. Left Greater Trochanter Bursitis: Continue to Alternate with Heat and Ice Therapy. Continue to Monitor.09/28/2019  F/U in 1 month  15 minutes of face to face patient care time was spent during this visit. All questions were encouraged and answered.

## 2019-10-03 LAB — DRUG TOX MONITOR 1 W/CONF, ORAL FLD
Alprazolam: NEGATIVE ng/mL (ref <0.50)
Amphetamines: NEGATIVE ng/mL (ref <10)
Barbiturates: NEGATIVE ng/mL (ref <10)
Benzodiazepines: POSITIVE ng/mL — AB (ref <0.50)
Buprenorphine: NEGATIVE ng/mL (ref <0.10)
Chlordiazepoxide: NEGATIVE ng/mL (ref <0.50)
Clonazepam: NEGATIVE ng/mL (ref <0.50)
Cocaine: NEGATIVE ng/mL (ref <5.0)
Codeine: NEGATIVE ng/mL (ref <2.5)
Cotinine: >250 ng/mL — ABNORMAL HIGH (ref <5.0)
Diazepam: NEGATIVE ng/mL (ref <0.50)
Dihydrocodeine: NEGATIVE ng/mL (ref <2.5)
Fentanyl: NEGATIVE ng/mL (ref <0.10)
Flunitrazepam: NEGATIVE ng/mL (ref <0.50)
Flurazepam: NEGATIVE ng/mL (ref <0.50)
Heroin Metabolite: NEGATIVE ng/mL (ref <1.0)
Hydrocodone: NEGATIVE ng/mL (ref <2.5)
Hydromorphone: NEGATIVE ng/mL (ref <2.5)
Lorazepam: >25 ng/mL — ABNORMAL HIGH (ref <0.50)
MARIJUANA: NEGATIVE ng/mL (ref <2.5)
MDMA: NEGATIVE ng/mL (ref <10)
Meprobamate: NEGATIVE ng/mL (ref <2.5)
Methadone: NEGATIVE ng/mL (ref <5.0)
Midazolam: NEGATIVE ng/mL (ref <0.50)
Morphine: NEGATIVE ng/mL (ref <2.5)
Nicotine Metabolite: POSITIVE ng/mL — AB (ref <5.0)
Nordiazepam: NEGATIVE ng/mL (ref <0.50)
Norhydrocodone: NEGATIVE ng/mL (ref <2.5)
Noroxycodone: 101.1 ng/mL — ABNORMAL HIGH (ref <2.5)
Opiates: POSITIVE ng/mL — AB (ref <2.5)
Oxazepam: NEGATIVE ng/mL (ref <0.50)
Oxycodone: >250 ng/mL — ABNORMAL HIGH (ref <2.5)
Oxymorphone: NEGATIVE ng/mL (ref <2.5)
Phencyclidine: NEGATIVE ng/mL (ref <10)
Tapentadol: NEGATIVE ng/mL (ref <5.0)
Temazepam: NEGATIVE ng/mL (ref <0.50)
Tramadol: NEGATIVE ng/mL (ref <5.0)
Triazolam: NEGATIVE ng/mL (ref <0.50)
Zolpidem: NEGATIVE ng/mL (ref <5.0)

## 2019-10-03 LAB — DRUG TOX ALC METAB W/CON, ORAL FLD: Alcohol Metabolite: NEGATIVE ng/mL (ref <25)

## 2019-10-10 DIAGNOSIS — J441 Chronic obstructive pulmonary disease with (acute) exacerbation: Secondary | ICD-10-CM | POA: Diagnosis not present

## 2019-10-10 DIAGNOSIS — I10 Essential (primary) hypertension: Secondary | ICD-10-CM | POA: Diagnosis not present

## 2019-10-10 DIAGNOSIS — Z720 Tobacco use: Secondary | ICD-10-CM | POA: Diagnosis not present

## 2019-10-10 DIAGNOSIS — E7849 Other hyperlipidemia: Secondary | ICD-10-CM | POA: Diagnosis not present

## 2019-10-12 ENCOUNTER — Telehealth: Payer: Self-pay | Admitting: *Deleted

## 2019-10-12 NOTE — Telephone Encounter (Signed)
Oral swab drug screen was consistent for prescribed medications.  ?

## 2019-10-19 ENCOUNTER — Emergency Department (HOSPITAL_COMMUNITY): Payer: Medicare HMO

## 2019-10-19 ENCOUNTER — Other Ambulatory Visit: Payer: Self-pay

## 2019-10-19 ENCOUNTER — Observation Stay (HOSPITAL_COMMUNITY)
Admission: EM | Admit: 2019-10-19 | Discharge: 2019-10-20 | Disposition: A | Discharge status: Home / Self Care | Payer: Medicare HMO | Attending: Internal Medicine | Admitting: Internal Medicine

## 2019-10-19 ENCOUNTER — Encounter (HOSPITAL_COMMUNITY): Payer: Self-pay

## 2019-10-19 DIAGNOSIS — M25532 Pain in left wrist: Secondary | ICD-10-CM | POA: Diagnosis not present

## 2019-10-19 DIAGNOSIS — S52602A Unspecified fracture of lower end of left ulna, initial encounter for closed fracture: Secondary | ICD-10-CM

## 2019-10-19 DIAGNOSIS — W1812XA Fall from or off toilet with subsequent striking against object, initial encounter: Secondary | ICD-10-CM | POA: Diagnosis not present

## 2019-10-19 DIAGNOSIS — F17210 Nicotine dependence, cigarettes, uncomplicated: Secondary | ICD-10-CM | POA: Diagnosis not present

## 2019-10-19 DIAGNOSIS — S0285XA Fracture of orbit, unspecified, initial encounter for closed fracture: Secondary | ICD-10-CM

## 2019-10-19 DIAGNOSIS — Y929 Unspecified place or not applicable: Secondary | ICD-10-CM | POA: Diagnosis not present

## 2019-10-19 DIAGNOSIS — M25539 Pain in unspecified wrist: Secondary | ICD-10-CM | POA: Diagnosis not present

## 2019-10-19 DIAGNOSIS — S52502A Unspecified fracture of the lower end of left radius, initial encounter for closed fracture: Secondary | ICD-10-CM

## 2019-10-19 DIAGNOSIS — S0181XA Laceration without foreign body of other part of head, initial encounter: Secondary | ICD-10-CM | POA: Diagnosis not present

## 2019-10-19 DIAGNOSIS — R55 Syncope and collapse: Secondary | ICD-10-CM | POA: Diagnosis not present

## 2019-10-19 DIAGNOSIS — Z20822 Contact with and (suspected) exposure to covid-19: Secondary | ICD-10-CM | POA: Diagnosis not present

## 2019-10-19 DIAGNOSIS — Y93E8 Activity, other personal hygiene: Secondary | ICD-10-CM | POA: Insufficient documentation

## 2019-10-19 DIAGNOSIS — I350 Nonrheumatic aortic (valve) stenosis: Secondary | ICD-10-CM

## 2019-10-19 DIAGNOSIS — S60212A Contusion of left wrist, initial encounter: Secondary | ICD-10-CM

## 2019-10-19 DIAGNOSIS — J439 Emphysema, unspecified: Secondary | ICD-10-CM | POA: Diagnosis present

## 2019-10-19 DIAGNOSIS — S62102A Fracture of unspecified carpal bone, left wrist, initial encounter for closed fracture: Secondary | ICD-10-CM | POA: Diagnosis not present

## 2019-10-19 DIAGNOSIS — J939 Pneumothorax, unspecified: Secondary | ICD-10-CM | POA: Diagnosis not present

## 2019-10-19 DIAGNOSIS — Z7982 Long term (current) use of aspirin: Secondary | ICD-10-CM | POA: Insufficient documentation

## 2019-10-19 DIAGNOSIS — R0689 Other abnormalities of breathing: Secondary | ICD-10-CM | POA: Diagnosis not present

## 2019-10-19 DIAGNOSIS — J449 Chronic obstructive pulmonary disease, unspecified: Secondary | ICD-10-CM | POA: Insufficient documentation

## 2019-10-19 DIAGNOSIS — S0232XA Fracture of orbital floor, left side, initial encounter for closed fracture: Secondary | ICD-10-CM | POA: Insufficient documentation

## 2019-10-19 DIAGNOSIS — I5032 Chronic diastolic (congestive) heart failure: Secondary | ICD-10-CM | POA: Diagnosis present

## 2019-10-19 DIAGNOSIS — R52 Pain, unspecified: Secondary | ICD-10-CM | POA: Diagnosis not present

## 2019-10-19 DIAGNOSIS — E079 Disorder of thyroid, unspecified: Secondary | ICD-10-CM | POA: Diagnosis present

## 2019-10-19 DIAGNOSIS — Y999 Unspecified external cause status: Secondary | ICD-10-CM | POA: Diagnosis not present

## 2019-10-19 DIAGNOSIS — R000 Tachycardia, unspecified: Secondary | ICD-10-CM | POA: Diagnosis not present

## 2019-10-19 DIAGNOSIS — S0003XA Contusion of scalp, initial encounter: Secondary | ICD-10-CM | POA: Diagnosis not present

## 2019-10-19 DIAGNOSIS — E039 Hypothyroidism, unspecified: Secondary | ICD-10-CM | POA: Diagnosis not present

## 2019-10-19 DIAGNOSIS — S0990XA Unspecified injury of head, initial encounter: Secondary | ICD-10-CM

## 2019-10-19 DIAGNOSIS — R0902 Hypoxemia: Secondary | ICD-10-CM | POA: Diagnosis not present

## 2019-10-19 DIAGNOSIS — S52572A Other intraarticular fracture of lower end of left radius, initial encounter for closed fracture: Secondary | ICD-10-CM | POA: Diagnosis not present

## 2019-10-19 DIAGNOSIS — R519 Headache, unspecified: Secondary | ICD-10-CM | POA: Diagnosis not present

## 2019-10-19 LAB — CBC WITH DIFFERENTIAL/PLATELET
Abs Immature Granulocytes: 0.06 10*3/uL (ref 0.00–0.07)
Basophils Absolute: 0.1 10*3/uL (ref 0.0–0.1)
Basophils Relative: 1 %
Eosinophils Absolute: 0.3 10*3/uL (ref 0.0–0.5)
Eosinophils Relative: 3 %
HCT: 33.3 % — ABNORMAL LOW (ref 36.0–46.0)
Hemoglobin: 10.2 g/dL — ABNORMAL LOW (ref 12.0–15.0)
Immature Granulocytes: 1 %
Lymphocytes Relative: 20 %
Lymphs Abs: 1.8 10*3/uL (ref 0.7–4.0)
MCH: 28.8 pg (ref 26.0–34.0)
MCHC: 30.6 g/dL (ref 30.0–36.0)
MCV: 94.1 fL (ref 80.0–100.0)
Monocytes Absolute: 0.6 10*3/uL (ref 0.1–1.0)
Monocytes Relative: 7 %
Neutro Abs: 6.3 10*3/uL (ref 1.7–7.7)
Neutrophils Relative %: 68 %
Platelets: 294 10*3/uL (ref 150–400)
RBC: 3.54 MIL/uL — ABNORMAL LOW (ref 3.87–5.11)
RDW: 15.9 % — ABNORMAL HIGH (ref 11.5–15.5)
WBC: 9.2 10*3/uL (ref 4.0–10.5)
nRBC: 0 % (ref 0.0–0.2)

## 2019-10-19 LAB — SARS CORONAVIRUS 2 BY RT PCR (HOSPITAL ORDER, PERFORMED IN ~~LOC~~ HOSPITAL LAB): SARS Coronavirus 2: NEGATIVE

## 2019-10-19 LAB — PROTIME-INR
INR: 1 (ref 0.8–1.2)
Prothrombin Time: 12.3 seconds (ref 11.4–15.2)

## 2019-10-19 LAB — COMPREHENSIVE METABOLIC PANEL
ALT: 13 U/L (ref 0–44)
AST: 15 U/L (ref 15–41)
Albumin: 3.3 g/dL — ABNORMAL LOW (ref 3.5–5.0)
Alkaline Phosphatase: 89 U/L (ref 38–126)
Anion gap: 10 (ref 5–15)
BUN: 21 mg/dL (ref 8–23)
CO2: 27 mmol/L (ref 22–32)
Calcium: 9.1 mg/dL (ref 8.9–10.3)
Chloride: 101 mmol/L (ref 98–111)
Creatinine, Ser: 1.1 mg/dL — ABNORMAL HIGH (ref 0.44–1.00)
GFR calc Af Amer: 56 mL/min — ABNORMAL LOW (ref >60)
GFR calc non Af Amer: 48 mL/min — ABNORMAL LOW (ref >60)
Glucose, Bld: 106 mg/dL — ABNORMAL HIGH (ref 70–99)
Potassium: 4.1 mmol/L (ref 3.5–5.1)
Sodium: 138 mmol/L (ref 135–145)
Total Bilirubin: 0.4 mg/dL (ref 0.3–1.2)
Total Protein: 6.6 g/dL (ref 6.5–8.1)

## 2019-10-19 LAB — TSH: TSH: 3.419 u[IU]/mL (ref 0.350–4.500)

## 2019-10-19 LAB — ETHANOL: Alcohol, Ethyl (B): <10 mg/dL (ref <10)

## 2019-10-19 MED ORDER — SODIUM CHLORIDE 0.9 % IV BOLUS
500.0000 mL | Freq: Once | INTRAVENOUS | Status: AC
  Administered 2019-10-19: 500 mL via INTRAVENOUS

## 2019-10-19 MED ORDER — ALBUTEROL SULFATE (2.5 MG/3ML) 0.083% IN NEBU
5.0000 mg | INHALATION_SOLUTION | Freq: Once | RESPIRATORY_TRACT | Status: AC
  Administered 2019-10-19: 5 mg via RESPIRATORY_TRACT
  Filled 2019-10-19: qty 6

## 2019-10-19 MED ORDER — OXYCODONE-ACETAMINOPHEN 5-325 MG PO TABS
1.0000 | ORAL_TABLET | ORAL | Status: DC | PRN
  Filled 2019-10-19: qty 1

## 2019-10-19 MED ORDER — LEVOTHYROXINE SODIUM 50 MCG PO TABS
50.0000 ug | ORAL_TABLET | Freq: Every day | ORAL | Status: DC
  Administered 2019-10-20: 50 ug via ORAL
  Filled 2019-10-19: qty 1

## 2019-10-19 MED ORDER — SIMVASTATIN 20 MG PO TABS
20.0000 mg | ORAL_TABLET | Freq: Every day | ORAL | Status: DC
  Administered 2019-10-19: 20 mg via ORAL
  Filled 2019-10-19: qty 2

## 2019-10-19 MED ORDER — FENTANYL CITRATE (PF) 100 MCG/2ML IJ SOLN
30.0000 ug | INTRAMUSCULAR | Status: DC | PRN
  Administered 2019-10-20 (×2): 30 ug via INTRAVENOUS
  Filled 2019-10-19 (×2): qty 2

## 2019-10-19 MED ORDER — DICLOFENAC SODIUM 1 % TD GEL: 1.0000 "application " | Freq: Three times a day (TID) | TRANSDERMAL | Status: DC | PRN

## 2019-10-19 MED ORDER — DOXEPIN HCL 25 MG PO CAPS
100.0000 mg | ORAL_CAPSULE | Freq: Every day | ORAL | Status: DC
  Administered 2019-10-19: 100 mg via ORAL
  Filled 2019-10-19 (×2): qty 1

## 2019-10-19 MED ORDER — ALBUTEROL SULFATE (2.5 MG/3ML) 0.083% IN NEBU
INHALATION_SOLUTION | RESPIRATORY_TRACT | Status: AC
  Filled 2019-10-19: qty 3

## 2019-10-19 MED ORDER — SODIUM CHLORIDE 0.9% FLUSH
3.0000 mL | Freq: Two times a day (BID) | INTRAVENOUS | Status: DC
  Administered 2019-10-19 – 2019-10-20 (×2): 3 mL via INTRAVENOUS

## 2019-10-19 MED ORDER — ONDANSETRON HCL 4 MG PO TABS: 4.0000 mg | ORAL_TABLET | Freq: Four times a day (QID) | ORAL | Status: DC | PRN

## 2019-10-19 MED ORDER — ONDANSETRON HCL 4 MG/2ML IJ SOLN: 4.0000 mg | Freq: Four times a day (QID) | INTRAMUSCULAR | Status: DC | PRN

## 2019-10-19 MED ORDER — LIDOCAINE-EPINEPHRINE (PF) 2 %-1:200000 IJ SOLN
20.0000 mL | Freq: Once | INTRAMUSCULAR | Status: AC
  Administered 2019-10-19: 20 mL
  Filled 2019-10-19: qty 20

## 2019-10-19 MED ORDER — LORAZEPAM 1 MG PO TABS
2.0000 mg | ORAL_TABLET | Freq: Four times a day (QID) | ORAL | Status: DC | PRN
  Administered 2019-10-20: 2 mg via ORAL
  Filled 2019-10-19: qty 2

## 2019-10-19 MED ORDER — OXYCODONE HCL 5 MG PO TABS
5.0000 mg | ORAL_TABLET | ORAL | Status: DC | PRN
  Administered 2019-10-20: 5 mg via ORAL
  Filled 2019-10-19 (×2): qty 1

## 2019-10-19 MED ORDER — IPRATROPIUM-ALBUTEROL 0.5-2.5 (3) MG/3ML IN SOLN: 3.0000 mL | Freq: Three times a day (TID) | RESPIRATORY_TRACT | Status: DC

## 2019-10-19 MED ORDER — ASPIRIN EC 81 MG PO TBEC
81.0000 mg | DELAYED_RELEASE_TABLET | Freq: Every morning | ORAL | Status: DC
  Administered 2019-10-20: 81 mg via ORAL
  Filled 2019-10-19: qty 1

## 2019-10-19 MED ORDER — SODIUM CHLORIDE 0.9 % IV SOLN: INTRAVENOUS | Status: AC

## 2019-10-19 MED ORDER — IPRATROPIUM-ALBUTEROL 0.5-2.5 (3) MG/3ML IN SOLN
3.0000 mL | Freq: Four times a day (QID) | RESPIRATORY_TRACT | Status: DC
  Administered 2019-10-20 (×3): 3 mL via RESPIRATORY_TRACT
  Filled 2019-10-19 (×3): qty 3

## 2019-10-19 MED ORDER — METOPROLOL TARTRATE 25 MG PO TABS
12.5000 mg | ORAL_TABLET | Freq: Every morning | ORAL | Status: DC
  Administered 2019-10-20: 12.5 mg via ORAL
  Filled 2019-10-19: qty 1

## 2019-10-19 MED ORDER — GABAPENTIN 300 MG PO CAPS
300.0000 mg | ORAL_CAPSULE | Freq: Three times a day (TID) | ORAL | Status: DC
  Administered 2019-10-19 – 2019-10-20 (×3): 300 mg via ORAL
  Filled 2019-10-19 (×3): qty 1

## 2019-10-19 MED ORDER — OXYCODONE-ACETAMINOPHEN 10-325 MG PO TABS: 1.0000 | ORAL_TABLET | ORAL | Status: DC | PRN

## 2019-10-19 MED ORDER — CEFAZOLIN SODIUM-DEXTROSE 1-4 GM/50ML-% IV SOLN
2.0000 g | Freq: Once | INTRAVENOUS | Status: AC
  Administered 2019-10-19: 2 g via INTRAVENOUS
  Filled 2019-10-19 (×2): qty 100

## 2019-10-19 NOTE — H&P (Addendum)
History and Physical:    Ashley Houston   WUJ:[id number] DOB: 27-Apr-1941 DOA: 10/19/2019  Referring MD/provider: Dr. Jodi Mourning PCP: Lianne Moris, PA-C   Patient coming from: Home  Chief Complaint: Syncope and fall  History of Present Illness:   Ashley Houston is an 79 y.o. female with PMH significant for HTN, COPD and known severe AS from echocardiogram 2017 was in her usual state of good health when she had an episode of syncope earlier today.  Patient states she had gone outside "to sit in the sun".  She came back to urinate and she does not remember what happened other than waking up on the floor with blood all over her.  Patient denies remembering any chest pain or palpitations.  She does not remember feeling dizzy at all.  She just states she went to sit down on the toilet and then she was on the floor.  Patient had immediate headache and pain in her arm.  Patient states that it is possible that she was dehydrated.  Notes she was sitting in the sun for a long time and although she tries to keep up with her fluids she felt a little thirsty.  Patient denies fevers or chills.  No cough, nausea vomiting or diarrhea.  She had been well until she had syncope earlier today.  ED Course:  The patient was noted to be covered in blood but was awake and alert.  Vital signs were essentially normal, and absence of tachycardia and normal EKG.  Patient was noted to have a complicated laceration of her scalp which was repaired in the ED.  Patient underwent radiologic work-up which did reveal no intracranial bleed but she did have a nondisplaced fracture of the superior left orbit.  This was discussed with ENT who recommended conservative therapy with outpatient follow-up in 5 days and 1 dose of Kefzol which was given.  She was also noted to have a comminuted impacted fracture of her wrist.  A splint was placed on this and hand surgery was called.  Patient was started on fentanyl for pain  control.  ROS:   ROS   Review of Systems: Per HPI  Past Medical History:   Past Medical History:  Diagnosis Date  . Anxiety   . Chronic diastolic heart failure (HCC)   . Chronic pain    hips back and shoulders  . COPD (chronic obstructive pulmonary disease) (HCC)   . Depression   . Hypothyroidism 09/08/2015  . Tachycardia   . Thyroid disease     Past Surgical History:   Past Surgical History:  Procedure Laterality Date  . ABDOMINAL HYSTERECTOMY    . CHOLECYSTECTOMY    . TONSILLECTOMY      Social History:   Social History   Socioeconomic History  . Marital status: Widowed    Spouse name: Not on file  . Number of children: Not on file  . Years of education: Not on file  . Highest education level: Not on file  Occupational History  . Not on file  Tobacco Use  . Smoking status: Current Every Day Smoker    Packs/day: 0.50    Types: Cigarettes  . Smokeless tobacco: Never Used  Substance and Sexual Activity  . Alcohol use: No  . Drug use: No  . Sexual activity: Not on file  Other Topics Concern  . Not on file  Social History Narrative  . Not on file   Social Determinants of Health   Financial Resource Strain:   .  Difficulty of Paying Living Expenses:   Food Insecurity:   . Worried About Programme researcher, broadcasting/film/video in the Last Year:   . Barista in the Last Year:   Transportation Needs:   . Freight forwarder (Medical):   Marland Kitchen Lack of Transportation (Non-Medical):   Physical Activity:   . Days of Exercise per Week:   . Minutes of Exercise per Session:   Stress:   . Feeling of Stress :   Social Connections:   . Frequency of Communication with Friends and Family:   . Frequency of Social Gatherings with Friends and Family:   . Attends Religious Services:   . Active Member of Clubs or Organizations:   . Attends Banker Meetings:   Marland Kitchen Marital Status:   Intimate Partner Violence:   . Fear of Current or Ex-Partner:   . Emotionally Abused:    Marland Kitchen Physically Abused:   . Sexually Abused:     Allergies   Lipitor [atorvastatin]  Family history:   No family history on file.  Current Medications:   Prior to Admission medications   Medication Sig Start Date End Date Taking? Authorizing Provider  albuterol (VENTOLIN HFA) 108 (90 Base) MCG/ACT inhaler Inhale 2 puffs into the lungs every 6 (six) hours as needed for wheezing or shortness of breath.  10/03/19  Yes [provider]  ASPIRIN ADULT LOW STRENGTH 81 MG EC tablet Take 81 mg by mouth in the morning.  06/18/17  Yes [provider]  Calcium Carbonate-Vitamin D (CALCARB 600/D PO) Take 1 tablet by mouth in the morning.   Yes [provider]  diclofenac sodium (VOLTAREN) 1 % GEL Apply 1 application topically 3 (three) times daily. Left knee Patient taking differently: Apply 1 application topically 3 (three) times daily as needed. Left knee/hip 04/19/18  Yes Ranelle Oyster, MD  doxepin (SINEQUAN) 100 MG capsule Take 100 mg by mouth at bedtime.  07/31/15  Yes [provider]  Ferrous Sulfate (IRON) 325 (65 Fe) MG TABS Take 325 mg by mouth daily.  04/24/17  Yes [provider]  gabapentin (NEURONTIN) 300 MG capsule Take 300 mg by mouth 3 (three) times daily. 06/18/17  Yes [provider]  ipratropium-albuterol (DUONEB) 0.5-2.5 (3) MG/3ML SOLN Take 3 mLs by nebulization 3 (three) times daily. Patient taking differently: Take 3 mLs by nebulization every 6 (six) hours as needed (for shortness of breath).  10/18/15  Yes Richarda Overlie, MD  levothyroxine (SYNTHROID) 50 MCG tablet Take 50 mcg by mouth daily before breakfast.  10/03/19  Yes [provider]  LORazepam (ATIVAN) 2 MG tablet Take 2 mg by mouth every 6 (six) hours as needed for anxiety.   Yes [provider]  metoprolol tartrate (LOPRESSOR) 25 MG tablet Take 12.5 mg by mouth in the morning.  10/03/19  Yes [provider]  Multiple Vitamin (MULTIVITAMIN WITH  MINERALS) TABS tablet Take 1 tablet by mouth 2 (two) times daily.   Yes [provider]  oxyCODONE-acetaminophen (PERCOCET) 10-325 MG tablet Take 1 tablet by mouth every 8 (eight) hours as needed. for pain 09/28/19  Yes [name] L, NP  polyethylene glycol (MIRALAX / GLYCOLAX) packet Take 17 g by mouth daily as needed for mild constipation. 10/18/15  Yes Richarda Overlie, MD  simvastatin (ZOCOR) 20 MG tablet Take 20 mg by mouth at bedtime. 06/18/17  Yes [provider]    Physical Exam:   Vitals:   10/19/19 1501 10/19/19 1513 10/19/19  1515 10/19/19 1925  BP:  117/63 116/65 (!) 152/71  Pulse:  70 60 78  Resp:  18 20 20   Temp:  98.7 F (37.1 C) 98.1 F (36.7 C)   TempSrc:  Oral Oral   SpO2:  98% 97% 95%  Weight: 58.5 kg     Height: 5\' 6"  (1.676 m)        Physical Exam: Blood pressure (!) 152/71, pulse 78, temperature 98.1 F (36.7 C), temperature source Oral, resp. rate 20, height 5\' 6"  (1.676 m), weight 58.5 kg, SpO2 95 %. Gen: Tired appearing but spry female with large bandage over her head lying in bed texting on her phone. Eyes: sclera anicteric, conjuctiva mildly injected bilaterally CVS: S1-S2, regulary, loud 2/6 systolic murmur throughout precordium Respiratory:  decreased air entry with rare scattered high-pitched wheeze. GI: NABS, soft, NT  LE: No edema. No cyanosis Neuro: A/O x 3, Moving all extremities equally with normal strength, CN 3-12 intact, grossly nonfocal.  Psych: patient is logical and coherent, judgement and insight appear normal, mood and affect appropriate to situation.   Data Review:    Labs: Basic Metabolic Panel: Recent Labs  Lab 10/19/19 1528  NA 138  K 4.1  CL 101  CO2 27  GLUCOSE 106*  BUN 21  CREATININE 1.10*  CALCIUM 9.1   Liver Function Tests: Recent Labs  Lab 10/19/19 1528  AST 15  ALT 13  ALKPHOS 89  BILITOT 0.4  PROT 6.6  ALBUMIN 3.3*   No results for input(s): LIPASE, AMYLASE in the last 168 hours. No  results for input(s): AMMONIA in the last 168 hours. CBC: Recent Labs  Lab 10/19/19 1528  WBC 9.2  NEUTROABS 6.3  HGB 10.2*  HCT 33.3*  MCV 94.1  PLT 294   Cardiac Enzymes: No results for input(s): CKTOTAL, CKMB, CKMBINDEX, TROPONINI in the last 168 hours.  BNP (last 3 results) No results for input(s): PROBNP in the last 8760 hours. CBG: No results for input(s): GLUCAP in the last 168 hours.  Urinalysis    Component Value Date/Time   COLORURINE YELLOW 10/16/2015 1818   APPEARANCEUR CLEAR 10/16/2015 1818   LABSPEC 1.010 10/16/2015 1818   PHURINE 5.5 10/16/2015 1818   GLUCOSEU NEGATIVE 10/16/2015 1818   HGBUR NEGATIVE 10/16/2015 1818   BILIRUBINUR NEGATIVE 10/16/2015 1818   KETONESUR NEGATIVE 10/16/2015 1818   PROTEINUR NEGATIVE 10/16/2015 1818   NITRITE NEGATIVE 10/16/2015 1818   LEUKOCYTESUR TRACE (A) 10/16/2015 1818      Radiographic Studies: DG Wrist Complete Left  Result Date: 10/19/2019 CLINICAL DATA:  12/16/2015, left wrist pain EXAM: LEFT WRIST - COMPLETE 3+ VIEW COMPARISON:  None. FINDINGS: Frontal, oblique, lateral, and ulnar deviated views of the left wrist are obtained. There is a comminuted intra-articular distal left radial fracture with mild dorsal impaction. Radiocarpal joint remains intact. Dorsal soft tissue swelling of the wrist. IMPRESSION: 1. Comminuted intra-articular distal left radial fracture with mild dorsal impaction. Electronically Signed   By: 12/16/2015 M.D.   On: 10/19/2019 17:00   CT Head Wo Contrast  Result Date: 10/19/2019 CLINICAL DATA:  Sharlet Salina, hit head EXAM: CT HEAD WITHOUT CONTRAST TECHNIQUE: Contiguous axial images were obtained from the base of the skull through the vertex without intravenous contrast. COMPARISON:  None. FINDINGS: Brain: No acute infarct or hemorrhage. Lateral ventricles and midline structures are unremarkable. No acute extra-axial fluid collections. No mass effect. Vascular: No hyperdense vessel or unexpected  calcification. Skull: Oblique lucency through the left orbital roof involves the  left frontal bone and left frontal sinus, consistent with nondisplaced fracture. There is a large scalp laceration in the left supraorbital region overlying this defect. Sinuses/Orbits: There is opacification of the left frontal sinus. Minimal fluid within the left sphenoid sinus. Other: None. IMPRESSION: 1. No acute intracranial process. 2. Nondisplaced fracture through the left orbital roof, involving the left frontal sinus. Please refer to facial CT report. 3. Left frontal scalp hematoma and laceration. Electronically Signed   By: Sharlet SalinaMichael  Brown M.D.   On: 10/19/2019 16:59   DG Chest Portable 1 View  Result Date: 10/19/2019 CLINICAL DATA:  Syncopal episode and fall today. EXAM: PORTABLE CHEST 1 VIEW COMPARISON:  Radiograph 01/27/2019, chest CT 01/27/2019 FINDINGS: Chronic volume loss in the right hemithorax. Chronic rightward tracheal deviation. Emphysema and chronic hyperinflation. Chronic bronchial thickening. Unchanged heart size and mediastinal contours. No confluent airspace disease, pulmonary edema, pleural effusion or pneumothorax. No acute osseous abnormalities are seen. IMPRESSION: 1. No acute chest findings. 2. Chronic hyperinflation and emphysema. Electronically Signed   By: Narda RutherfordMelanie  Sanford M.D.   On: 10/19/2019 19:29   CT Maxillofacial WO CM  Result Date: 10/19/2019 CLINICAL DATA:  Larey SeatFell EXAM: CT MAXILLOFACIAL WITHOUT CONTRAST TECHNIQUE: Multidetector CT imaging of the maxillofacial structures was performed. Multiplanar CT image reconstructions were also generated. COMPARISON:  None. FINDINGS: Osseous: There is an oblique lucency through the left orbital roof, extending into the left frontal sinus, reference images 14 through 18 of series 3, compatible with nondisplaced fracture. This is immediately deep to the large left frontal scalp laceration. There are no other acute displaced fractures. Orbits: Globes are  intact. Sinuses: There is opacification of the left frontal sinus. Remaining sinuses are clear. Soft tissues: Large left supraorbital soft tissue laceration with scalp edema. Limited intracranial: No significant or unexpected finding. IMPRESSION: 1. Left supraorbital scalp hematoma and large laceration. 2. Oblique lucency through the left orbital roof extending to the left frontal sinus, consistent with nondisplaced fracture. 3. Opacification of the left frontal sinus. Electronically Signed   By: Sharlet SalinaMichael  Brown M.D.   On: 10/19/2019 16:57    EKG: Independently reviewed.  Sinus rhythm at 70.  Normal intervals.  Normal axis.  Normal EKG.  No acute ST-T wave changes.   Assessment/Plan:   Principal Problem:   Syncope Active Problems:   COPD (chronic obstructive pulmonary disease) with emphysema (HCC)   Thyroid disease   Chronic diastolic heart failure (HCC)   Closed fracture of left orbital floor (HCC)   Closed fracture of left distal radius  79 year old female with known severe AS from echocardiogram 2017 had episode of syncope today with resultant nondisplaced left orbital fracture and comminuted left radial fracture.   Syncope Cause of syncope is most likely to be her known severe AS from 2017 however gradient at that time was around 50. Of note she states she was sitting in the sun and was somewhat thirsty so preload might have been down. Normal saline at 125 cc an hour for 20 hours is ordered. Patient states she is not had a repeat echocardiogram, [place] but has not been seen in a while. Repeat echocardiogram ordered Patient placed on telemetry EKG within normal limits, no chest pain  Orbital fracture Per ED this was discussed with ENT who recommend conservative therapy with outpatient follow-up in 5 to 7 days. Patient was given a dose of Kefzol.  Wrist fracture Patient with comminuted left wrist, distal radial fracture,  patient was placed in  a splint. Hand surgery was called but I see no note from them. Can discuss with orthopedics in the morning as warranted. Fentanyl and oxycodone as needed for pain control  HTN Patient's blood pressure seems to have normalized with fluids. Will continue metoprolol 12.5 daily  COPD Patient with few wheezes on exam, will treat with inhaled bronchodilators Will not start steroids right now  Diastolic heart failure No evidence for decompensated heart failure  Anxiety and depression Continue doxepin and lorazepam per home doses  Hypothyroidism Continue Synthroid per home doses  Other information:   DVT prophylaxis: SCD ordered. Code Status: Full Family Communication: None, patient was on the phone with her daughter Disposition Plan: Home Consults called: Curbside with ENT and hand surgery per ED report Admission status: Observation  Barnie Sopko Tublu Lynnelle Mesmer [facility]  If 7PM-7AM, please contact night-coverage [email or web address] Password [facility] 10/19/2019, 9:42 PM

## 2019-10-19 NOTE — Plan of Care (Signed)
  Problem: Education: Goal: Knowledge of General Education information will improve Description: Including pain rating scale, medication(s)/side effects and non-pharmacologic comfort measures Outcome: Progressing   Problem: Clinical Measurements: Goal: Respiratory complications will improve Outcome: Progressing   Problem: Clinical Measurements: Goal: Cardiovascular complication will be avoided Outcome: Progressing   Problem: Activity: Goal: Risk for activity intolerance will decrease Outcome: Progressing   Problem: Nutrition: Goal: Adequate nutrition will be maintained Outcome: Progressing   Problem: Elimination: Goal: Will not experience complications related to bowel motility Outcome: Progressing   Problem: Pain Managment: Goal: General experience of comfort will improve Outcome: Progressing   Problem: Safety: Goal: Ability to remain free from injury will improve Outcome: Progressing   Problem: Physical Regulation: Goal: Complications related to the disease process, condition or treatment will be avoided or minimized Outcome: Progressing

## 2019-10-19 NOTE — ED Notes (Signed)
Pt clothes changed, purwick placed, and meal given

## 2019-10-19 NOTE — ED Triage Notes (Signed)
Ems reports pt had been sitting outside and says pt came in to use the bathroom and pt fell off of commode.  Unknown what caused the fall.  Pt hit head on corner of counter top.  Pt says she doesn't remember fall off of the commode.  CBG 121 per ems.  PT c/o left wrist pain and pain at laceration.  EMS started IV and gave saline.  Initial bp was 100/58 then increased to 124/62.  Pt wears o2 at home usually at night.  Room air o2 sat 98%.  Pt presently alert and oriented.

## 2019-10-19 NOTE — ED Provider Notes (Signed)
[facility] EMERGENCY DEPARTMENT Provider Note   CSN: [id number] Arrival date & time: 10/19/19  1455     History Chief Complaint  Patient presents with  . Fall    Ashley Houston is a 80 y.o. female.  Patient with history of diastolic heart failure, COPD, hypothyroidism presents after near syncope and head injury.  Patient was in the sun most of the day to sunbathing and went into go the bathroom and she felt lightheaded/weak causing her to fall off the commode hitting the left side of her forehead on the sink.  Patient denies syncope.  Patient denies chest pain or shortness of breath.  Patient has bleeding from left forehead wound, denies blood thinners.  Patient felt she was staying hydrated enough outside.  Patient wears oxygen at night for COPD.        Past Medical History:  Diagnosis Date  . Anxiety   . Chronic diastolic heart failure ([place])   . Chronic pain    hips back and shoulders  . COPD (chronic obstructive pulmonary disease) ([place])   . Depression   . Hypothyroidism 09/08/2015  . Tachycardia   . Thyroid disease     Patient Active Problem List   Diagnosis Date Noted  . DDD (degenerative disc disease), lumbar 10/27/2016  . Greater trochanteric bursitis of left hip 08/20/2016  . Primary osteoarthritis of left knee 08/20/2016  . Spondylosis without myelopathy or radiculopathy, lumbar region 08/20/2016  . Acute on chronic respiratory failure ([place]) 10/16/2015  . COPD with acute exacerbation ([place]) 10/16/2015  . Essential hypertension 10/16/2015  . Normocytic anemia 09/09/2015  . COPD exacerbation ([place]) 09/08/2015  . Chronic diastolic heart failure ([place]) 09/08/2015  . Hypothyroidism 09/08/2015  . Hyperglycemia, drug-induced 09/08/2015  . Generalized weakness 09/07/2015  . CAP (community acquired pneumonia) 08/15/2015  . COPD (chronic obstructive pulmonary disease) with emphysema ([place]) 08/15/2015  . Thyroid disease 08/15/2015  . Tobacco abuse 08/15/2015    Past  Surgical History:  Procedure Laterality Date  . ABDOMINAL HYSTERECTOMY    . CHOLECYSTECTOMY    . TONSILLECTOMY       OB History    Gravida  5   Para  5   Term  5   Preterm      AB      Living  5     SAB      TAB      Ectopic      Multiple      Live Births              No family history on file.  Social History   Tobacco Use  . Smoking status: Current Every Day Smoker    Packs/day: 0.50    Types: Cigarettes  . Smokeless tobacco: Never Used  Substance Use Topics  . Alcohol use: No  . Drug use: No    Home Medications Prior to Admission medications   Medication Sig Start Date End Date Taking? Authorizing Provider  albuterol (PROVENTIL) (2.5 MG/3ML) 0.083% nebulizer solution Take 3 mLs by nebulization daily as needed for wheezing or shortness of breath.  08/04/15   [provider]  ASPIRIN ADULT LOW STRENGTH 81 MG EC tablet Take 81 mg by mouth daily. 06/18/17   [provider]  CALCIUM PO Take 1 tablet by mouth daily.    [provider]  diclofenac sodium (VOLTAREN) 1 % GEL Apply 1 application topically 3 (three) times daily. Left knee 04/19/18   Meredith Staggers, MD  doxepin (  SINEQUAN) 100 MG capsule Take 1 capsule by mouth at bedtime. 07/31/15   [provider]  Ferrous Sulfate (IRON) 325 (65 Fe) MG TABS Take 1 tablet by mouth 2 (two) times daily. 04/24/17   [provider]  gabapentin (NEURONTIN) 300 MG capsule Take 300 mg by mouth 3 (three) times daily. 06/18/17   [provider]  ipratropium-albuterol (DUONEB) 0.5-2.5 (3) MG/3ML SOLN Take 3 mLs by nebulization 3 (three) times daily. 10/18/15   Richarda Overlie, MD  levothyroxine (SYNTHROID, LEVOTHROID) 25 MCG tablet Take 25 mcg by mouth daily before breakfast.    [provider]  LORazepam (ATIVAN) 2 MG tablet Take 2 mg by mouth every 6 (six) hours as needed for anxiety.    [provider]  meloxicam (MOBIC) 15 MG tablet Take 1 tablet (15 mg  total) by mouth daily as needed for pain. 08/26/17   Ranelle Oyster, MD  metoprolol succinate (TOPROL-XL) 25 MG 24 hr tablet Take 0.5 mg by mouth daily.  08/30/15   [provider]  mometasone-formoterol (DULERA) 100-5 MCG/ACT AERO Inhale 2 puffs into the lungs 2 (two) times daily. 09/10/15   Elliot Cousin, MD  Multiple Vitamin (MULTIVITAMIN WITH MINERALS) TABS tablet Take 1 tablet by mouth 2 (two) times daily.    [provider]  oxyCODONE-acetaminophen (PERCOCET) 10-325 MG tablet Take 1 tablet by mouth every 8 (eight) hours as needed. for pain 09/28/19   Jones Bales, NP  polyethylene glycol (MIRALAX / GLYCOLAX) packet Take 17 g by mouth daily as needed for mild constipation. 10/18/15   Richarda Overlie, MD  simvastatin (ZOCOR) 20 MG tablet Take 20 mg by mouth at bedtime. 06/18/17   [provider]  tiotropium (SPIRIVA) 18 MCG inhalation capsule Place 1 capsule (18 mcg total) into inhaler and inhale daily. 09/10/15   Elliot Cousin, MD    Allergies    Lipitor [atorvastatin]  Review of Systems   Review of Systems  Constitutional: Positive for fatigue. Negative for chills and fever.  HENT: Negative for congestion.   Eyes: Negative for visual disturbance.  Respiratory: Negative for shortness of breath.   Cardiovascular: Negative for chest pain.  Gastrointestinal: Negative for abdominal pain and vomiting.  Genitourinary: Negative for dysuria and flank pain.  Musculoskeletal: Negative for back pain, neck pain and neck stiffness.  Skin: Positive for wound. Negative for rash.  Neurological: Positive for light-headedness and headaches.    Physical Exam Updated Vital Signs BP 116/65 (BP Location: Right Arm)   Pulse 60   Temp 98.1 F (36.7 C) (Oral)   Resp 20   Ht 5\' 6"  (1.676 m)   Wt 58.5 kg   SpO2 97%   BMI 20.82 kg/m   Physical Exam Vitals and nursing note reviewed.  Constitutional:      Appearance: She is well-developed.  HENT:     Head: Normocephalic.      Comments: Patient has stellate deep laceration to the skull left forehead approximately 8 cm 1 direction and 6 cm the other.  Gaping wound, mild bleeding.  No midline cervical tenderness full range of motion head neck without pain.    Mouth/Throat:     Mouth: Mucous membranes are dry.  Eyes:     General:        Right eye: No discharge.        Left eye: No discharge.     Conjunctiva/sclera: Conjunctivae normal.  Neck:     Trachea: No tracheal deviation.  Cardiovascular:  Rate and Rhythm: Normal rate and regular rhythm.     Heart sounds: Murmur (aortic) present.  Pulmonary:     Effort: Pulmonary effort is normal.     Breath sounds: Normal breath sounds.  Abdominal:     General: There is no distension.     Palpations: Abdomen is soft.     Tenderness: There is no abdominal tenderness. There is no guarding.  Musculoskeletal:     Cervical back: Normal range of motion and neck supple.  Skin:    General: Skin is warm.     Findings: No rash.  Neurological:     Mental Status: She is alert and oriented to person, place, and time.     Cranial Nerves: No cranial nerve deficit.  Psychiatric:        Mood and Affect: Mood normal.     ED Results / Procedures / Treatments   Labs (all labs ordered are listed, but only abnormal results are displayed) Labs Reviewed  COMPREHENSIVE METABOLIC PANEL - Abnormal; Notable for the following components:      Result Value   Glucose, Bld 106 (*)    Creatinine, Ser 1.10 (*)    Albumin 3.3 (*)    GFR calc non Af Amer 48 (*)    GFR calc Af Amer 56 (*)    All other components within normal limits  CBC WITH DIFFERENTIAL/PLATELET - Abnormal; Notable for the following components:   RBC 3.54 (*)    Hemoglobin 10.2 (*)    HCT 33.3 (*)    RDW 15.9 (*)    All other components within normal limits  SARS CORONAVIRUS 2 BY RT PCR (HOSPITAL ORDER, PERFORMED IN [facility])  ETHANOL  PROTIME-INR  URINALYSIS, ROUTINE W REFLEX MICROSCOPIC     EKG EKG Interpretation  Date/Time:  Wednesday October 19 2019 15:07:04 EDT Ventricular Rate:  69 PR Interval:  176 QRS Duration: 82 QT Interval:  388 QTC Calculation: 415 R Axis:   0 Text Interpretation: Normal sinus rhythm Normal ECG Confirmed by Eber HongMiller, Brian ([id number]) on 10/19/2019 3:11:36 PM   Radiology DG Wrist Complete Left  Result Date: 10/19/2019 CLINICAL DATA:  Larey SeatFell, left wrist pain EXAM: LEFT WRIST - COMPLETE 3+ VIEW COMPARISON:  None. FINDINGS: Frontal, oblique, lateral, and ulnar deviated views of the left wrist are obtained. There is a comminuted intra-articular distal left radial fracture with mild dorsal impaction. Radiocarpal joint remains intact. Dorsal soft tissue swelling of the wrist. IMPRESSION: 1. Comminuted intra-articular distal left radial fracture with mild dorsal impaction. Electronically Signed   By: Sharlet SalinaMichael  Brown M.D.   On: 10/19/2019 17:00   CT Head Wo Contrast  Result Date: 10/19/2019 CLINICAL DATA:  Larey SeatFell, hit head EXAM: CT HEAD WITHOUT CONTRAST TECHNIQUE: Contiguous axial images were obtained from the base of the skull through the vertex without intravenous contrast. COMPARISON:  None. FINDINGS: Brain: No acute infarct or hemorrhage. Lateral ventricles and midline structures are unremarkable. No acute extra-axial fluid collections. No mass effect. Vascular: No hyperdense vessel or unexpected calcification. Skull: Oblique lucency through the left orbital roof involves the left frontal bone and left frontal sinus, consistent with nondisplaced fracture. There is a large scalp laceration in the left supraorbital region overlying this defect. Sinuses/Orbits: There is opacification of the left frontal sinus. Minimal fluid within the left sphenoid sinus. Other: None. IMPRESSION: 1. No acute intracranial process. 2. Nondisplaced fracture through the left orbital roof, involving the left frontal sinus. Please refer to facial CT report. 3. Left  frontal scalp hematoma and  laceration. Electronically Signed   By: Sharlet Salina M.D.   On: 10/19/2019 16:59   CT Maxillofacial WO CM  Result Date: 10/19/2019 CLINICAL DATA:  Larey Seat EXAM: CT MAXILLOFACIAL WITHOUT CONTRAST TECHNIQUE: Multidetector CT imaging of the maxillofacial structures was performed. Multiplanar CT image reconstructions were also generated. COMPARISON:  None. FINDINGS: Osseous: There is an oblique lucency through the left orbital roof, extending into the left frontal sinus, reference images 14 through 18 of series 3, compatible with nondisplaced fracture. This is immediately deep to the large left frontal scalp laceration. There are no other acute displaced fractures. Orbits: Globes are intact. Sinuses: There is opacification of the left frontal sinus. Remaining sinuses are clear. Soft tissues: Large left supraorbital soft tissue laceration with scalp edema. Limited intracranial: No significant or unexpected finding. IMPRESSION: 1. Left supraorbital scalp hematoma and large laceration. 2. Oblique lucency through the left orbital roof extending to the left frontal sinus, consistent with nondisplaced fracture. 3. Opacification of the left frontal sinus. Electronically Signed   By: Sharlet Salina M.D.   On: 10/19/2019 16:57    Procedures .Marland KitchenLaceration Repair  Date/Time: 10/19/2019 6:41 PM Performed by: Blane Ohara, MD Authorized by: Blane Ohara, MD   Consent:    Consent obtained:  Verbal   Consent given by:  Patient   Risks discussed:  Infection, need for additional repair, nerve damage, poor cosmetic result, poor wound healing and pain   Alternatives discussed:  No treatment Anesthesia (see MAR for exact dosages):    Anesthesia method:  Local infiltration   Local anesthetic:  Lidocaine 1% WITH epi Laceration details:    Location:  Face   Face location:  Forehead   Length (cm):  10   Depth (mm):  10 Repair type:    Repair type:  Complex Pre-procedure details:    Preparation:  Patient was prepped  and draped in usual sterile fashion and imaging obtained to evaluate for foreign bodies Exploration:    Limited defect created (wound extended): no     Hemostasis achieved with:  Direct pressure   Wound exploration: wound explored through full range of motion and entire depth of wound probed and visualized     Wound extent: areolar tissue violated, muscle damage and underlying fracture     Wound extent: no foreign bodies/material noted and no vascular damage noted     Contaminated: no   Treatment:    Area cleansed with:  Saline   Amount of cleaning:  Extensive   Irrigation solution:  Sterile saline   Irrigation volume:  100   Irrigation method:  Syringe   Visualized foreign bodies/material removed: no     Debridement:  Minimal   Undermining:  Minimal Skin repair:    Repair method:  Sutures   Suture size:  5-0   Suture material:  Prolene   Suture technique:  Simple interrupted   Number of sutures:  8 Approximation:    Approximation:  Close Post-procedure details:    Dressing:  Antibiotic ointment and non-adherent dressing   Patient tolerance of procedure:  Tolerated well, no immediate complications .Marland KitchenLaceration Repair  Date/Time: 10/19/2019 6:42 PM Performed by: Blane Ohara, MD Authorized by: Blane Ohara, MD   Consent:    Consent obtained:  Verbal   Consent given by:  Patient   Risks discussed:  Infection, need for additional repair, nerve damage, poor wound healing, poor cosmetic result and pain   Alternatives discussed:  No treatment Anesthesia (see MAR for exact  dosages):    Anesthesia method:  Local infiltration   Local anesthetic:  Lidocaine 1% WITH epi Laceration details:    Location:  Face   Face location:  Forehead   Length (cm):  6   Depth (mm):  10 Repair type:    Repair type:  Complex Pre-procedure details:    Preparation:  Patient was prepped and draped in usual sterile fashion and imaging obtained to evaluate for foreign bodies Exploration:     Limited defect created (wound extended): no     Hemostasis achieved with:  Direct pressure and epinephrine   Wound exploration: wound explored through full range of motion and entire depth of wound probed and visualized     Wound extent: areolar tissue violated and muscle damage     Wound extent: no foreign bodies/material noted and no vascular damage noted     Contaminated: no   Treatment:    Area cleansed with:  Saline   Amount of cleaning:  Extensive   Irrigation solution:  Sterile saline   Irrigation volume:  100   Irrigation method:  Syringe   Visualized foreign bodies/material removed: no     Debridement:  Minimal   Undermining:  Minimal   Scar revision: no   Skin repair:    Repair method:  Sutures   Suture material:  Prolene   Suture technique:  Simple interrupted   Number of sutures:  6 Approximation:    Approximation:  Close Post-procedure details:    Dressing:  Antibiotic ointment and non-adherent dressing   Patient tolerance of procedure:  Tolerated well, no immediate complications .Critical Care Performed by: Blane Ohara, MD Authorized by: Blane Ohara, MD   Critical care provider statement:    Critical care time (minutes):  40   Critical care start time:  10/19/2019 6:00 PM   Critical care end time:  10/19/2019 6:40 PM   Critical care time was exclusive of:  Separately billable procedures and treating other patients and teaching time   Critical care was necessary to treat or prevent imminent or life-threatening deterioration of the following conditions:  Trauma   Critical care was time spent personally by me on the following activities:  Discussions with consultants, evaluation of patient's response to treatment, examination of patient, ordering and performing treatments and interventions, ordering and review of laboratory studies, ordering and review of radiographic studies, pulse oximetry, re-evaluation of patient's condition, obtaining history from patient or  surrogate and review of old charts   (including critical care time)  Medications Ordered in ED Medications  lidocaine-EPINEPHrine (XYLOCAINE W/EPI) 2 %-1:200000 (PF) injection 20 mL (has no administration in time range)  ceFAZolin (ANCEF) IVPB 1 g/50 mL premix (has no administration in time range)  fentaNYL (SUBLIMAZE) injection 30 mcg (has no administration in time range)  sodium chloride 0.9 % bolus 500 mL (500 mLs Intravenous New Bag/Given 10/19/19 1545)    ED Course  I have reviewed the triage vital signs and the nursing notes.  Pertinent labs & imaging results that were available during my care of the patient were reviewed by me and considered in my medical decision making (see chart for details).    MDM Rules/Calculators/A&P                      Patient presents after near syncopal episode and significant head injury.  Patient is complicated laceration that will need repair.  CT scan of head and face ordered and pending. Wound care discussed with nursing.  Blood work ordered to check for anemia, electrolyte abnormalities.  EKG reviewed no acute abnormality sinus. Plan for repair of laceration with lidocaine. Patient does have aortic murmur she said she has had that for many years, reviewed medical records and patient had an echo done 2017 showing possible bicuspid valve and severe stenosis.  This likely contributed to her syncope/ near syncope.  IV fluids given. UA pending. CT scan results reviewed no intracranial bleeding, external laceration and swelling, superior orbital fracture on the left. X-ray of the wrist reviewed comminuted impacted fracture.  Ordered splint and discussed with nursing.  Fentanyl ordered for pain as needed. Complicated forehead lacerations repaired requiring significant time. Ancef given for antibiotics for complicated laceration and possible communication with fracture.  Paged on-call for facial trauma patient will likely need outpatient follow-up for  orbital fracture, page orthopedic hand surgeon and paged hospitalist for admission for syncope, pain control, echo and further work-up.  Discussed with hospitalist for admission.   Final Clinical Impression(s) / ED Diagnoses Final diagnoses:  Acute head injury, initial encounter  Forehead laceration, initial encounter  Contusion of left wrist, initial encounter  Aortic stenosis, severe  Closed fracture of left distal radius and ulna, initial encounter    Rx / DC Orders ED Discharge Orders    None       Blane Ohara, MD 10/19/19 1858

## 2019-10-20 ENCOUNTER — Observation Stay (HOSPITAL_BASED_OUTPATIENT_CLINIC_OR_DEPARTMENT_OTHER): Payer: Medicare HMO

## 2019-10-20 ENCOUNTER — Observation Stay (HOSPITAL_COMMUNITY): Payer: Medicare HMO

## 2019-10-20 DIAGNOSIS — I6523 Occlusion and stenosis of bilateral carotid arteries: Secondary | ICD-10-CM | POA: Diagnosis not present

## 2019-10-20 DIAGNOSIS — R55 Syncope and collapse: Secondary | ICD-10-CM | POA: Diagnosis not present

## 2019-10-20 DIAGNOSIS — I350 Nonrheumatic aortic (valve) stenosis: Secondary | ICD-10-CM

## 2019-10-20 LAB — CBC
HCT: 30 % — ABNORMAL LOW (ref 36.0–46.0)
Hemoglobin: 9.3 g/dL — ABNORMAL LOW (ref 12.0–15.0)
MCH: 28.7 pg (ref 26.0–34.0)
MCHC: 31 g/dL (ref 30.0–36.0)
MCV: 92.6 fL (ref 80.0–100.0)
Platelets: 309 10*3/uL (ref 150–400)
RBC: 3.24 MIL/uL — ABNORMAL LOW (ref 3.87–5.11)
RDW: 15.6 % — ABNORMAL HIGH (ref 11.5–15.5)
WBC: 9.4 10*3/uL (ref 4.0–10.5)
nRBC: 0 % (ref 0.0–0.2)

## 2019-10-20 LAB — COMPREHENSIVE METABOLIC PANEL
ALT: 11 U/L (ref 0–44)
AST: 14 U/L — ABNORMAL LOW (ref 15–41)
Albumin: 3 g/dL — ABNORMAL LOW (ref 3.5–5.0)
Alkaline Phosphatase: 86 U/L (ref 38–126)
Anion gap: 9 (ref 5–15)
BUN: 15 mg/dL (ref 8–23)
CO2: 27 mmol/L (ref 22–32)
Calcium: 8.6 mg/dL — ABNORMAL LOW (ref 8.9–10.3)
Chloride: 103 mmol/L (ref 98–111)
Creatinine, Ser: 0.83 mg/dL (ref 0.44–1.00)
GFR calc Af Amer: >60 mL/min (ref >60)
GFR calc non Af Amer: >60 mL/min (ref >60)
Glucose, Bld: 109 mg/dL — ABNORMAL HIGH (ref 70–99)
Potassium: 3.8 mmol/L (ref 3.5–5.1)
Sodium: 139 mmol/L (ref 135–145)
Total Bilirubin: 0.4 mg/dL (ref 0.3–1.2)
Total Protein: 6.2 g/dL — ABNORMAL LOW (ref 6.5–8.1)

## 2019-10-20 LAB — ECHOCARDIOGRAM COMPLETE
Height: 66 in
Weight: 2088.2 oz

## 2019-10-20 MED ORDER — OXYCODONE-ACETAMINOPHEN 5-325 MG PO TABS: 1.0000 | ORAL_TABLET | Freq: Four times a day (QID) | ORAL | 0 refills | Status: DC | PRN

## 2019-10-20 NOTE — Progress Notes (Signed)
*  PRELIMINARY RESULTS* Echocardiogram 2D Echocardiogram has been performed.  Ashley Houston 10/20/2019, 3:19 PM

## 2019-10-20 NOTE — Care Management Obs Status (Signed)
MEDICARE OBSERVATION STATUS NOTIFICATION   Patient Details  Name: Ashley Houston MRN: [id number] Date of Birth: November 16, 1940   Medicare Observation Status Notification Given:  Yes    Corey Harold 10/20/2019, 2:38 PM

## 2019-10-20 NOTE — Discharge Summary (Signed)
Physician Discharge Summary  Ashley Houston ZHG:[id number] DOB: 15-Aug-1940 DOA: 10/19/2019  PCP: Lianne Moris, PA-C  Admit date: 10/19/2019  Discharge date: 10/20/2019  Admitted From:Home  Disposition:  Home  Recommendations for Outpatient Follow-up:  1. Follow up with PCP in 1-2 weeks 2. Follow-up with cardiology Dr. Wyline Mood as scheduled in [place] office on 7/8 to review 2D echocardiogram and need for aortic valve replacement.  Patient is currently reluctant to have any procedures or further evaluation even while inpatient. 3. Follow-up with ENT Dr. Pollyann Kennedy on 6/15 at 10:40 AM regarding evaluation of orbital fracture 4. Follow-up with Dr. Janee Morn with [facility].  Practice will call the patient to schedule an appointment for left wrist fracture in the next 1-2 weeks.  Home Health: None  Equipment/Devices: Has home nasal cannula oxygen  Discharge Condition: Stable  CODE STATUS: Full  Diet recommendation: Heart Healthy  Brief/Interim Summary: Per HPI: Ashley Houston is an 79 y.o. female with PMH significant for HTN, COPD and known severe AS from echocardiogram 2017 was in her usual state of good health when she had an episode of syncope earlier today.  Patient states she had gone outside "to sit in the sun".  She came back to urinate and she does not remember what happened other than waking up on the floor with blood all over her.  Patient denies remembering any chest pain or palpitations.  She does not remember feeling dizzy at all.  She just states she went to sit down on the toilet and then she was on the floor.  Patient had immediate headache and pain in her arm.  Patient states that it is possible that she was dehydrated.  Notes she was sitting in the sun for a long time and although she tries to keep up with her fluids she felt a little thirsty.  Patient denies fevers or chills.  No cough, nausea vomiting or diarrhea.  She had been well until she had syncope earlier  today.  ED Course:  The patient was noted to be covered in blood but was awake and alert.  Vital signs were essentially normal, and absence of tachycardia and normal EKG.  Patient was noted to have a complicated laceration of her scalp which was repaired in the ED.  Patient underwent radiologic work-up which did reveal no intracranial bleed but she did have a nondisplaced fracture of the superior left orbit.  This was discussed with ENT who recommended conservative therapy with outpatient follow-up in 5 days and 1 dose of Kefzol which was given.  She was also noted to have a comminuted impacted fracture of her wrist.  A splint was placed on this and hand surgery was called.  Patient was started on fentanyl for pain control.  -Patient was admitted with syncopal episode and had sustained a scalp laceration as well as left orbital fracture along with left wrist fracture as noted below.  She has remained stable throughout the course of her stay with no findings on telemetry.  Her orthostatics were negative as well.  She is noted to have a prior history of aortic stenosis which was initially followed at [facility], but she has not gone back since 2017.  She is agreeable to follow-up with cardiology now in [place].  Her aortic valve gradient has increased to 60 and her LVEF is 60-65% with grade 1 diastolic dysfunction noted.  She will require evaluation for aortic valve replacement in the very near future and it is questionable whether or not she  will agree to this or if she will even attend her cardiology follow-up appointment.  I have encouraged her to do so as well as the rest of her appointments.  No other acute events noted throughout the course of this admission and she is otherwise stable for discharge.  Discharge Diagnoses:  Principal Problem:   Syncope Active Problems:   COPD (chronic obstructive pulmonary disease) with emphysema (HCC)   Thyroid disease   Chronic diastolic heart failure (HCC)   Closed  fracture of left orbital floor (HCC)   Closed fracture of left distal radius  Principal discharge diagnosis: Syncope with collapse secondary to severe aortic valve stenosis.  Discharge Instructions  Discharge Instructions    Diet - low sodium heart healthy   Complete by: As directed    If the dressing is still on your incision site when you go home, remove it on the third day after your surgery date. Remove dressing if it begins to fall off, or if it is dirty or damaged before the third day.   Complete by: As directed    Increase activity slowly   Complete by: As directed      Allergies as of 10/20/2019      Reactions   Lipitor [atorvastatin] Itching      Medication List    STOP taking these medications   oxyCODONE-acetaminophen 10-325 MG tablet Commonly known as: PERCOCET Replaced by: oxyCODONE-acetaminophen 5-325 MG tablet     TAKE these medications   albuterol 108 (90 Base) MCG/ACT inhaler Commonly known as: VENTOLIN HFA Inhale 2 puffs into the lungs every 6 (six) hours as needed for wheezing or shortness of breath.   Aspirin Adult Low Strength 81 MG EC tablet Generic drug: aspirin Take 81 mg by mouth in the morning.   CALCARB 600/D PO Take 1 tablet by mouth in the morning.   diclofenac sodium 1 % Gel Commonly known as: VOLTAREN Apply 1 application topically 3 (three) times daily. Left knee What changed:   when to take this  reasons to take this  additional instructions   doxepin 100 MG capsule Commonly known as: SINEQUAN Take 100 mg by mouth at bedtime.   gabapentin 300 MG capsule Commonly known as: NEURONTIN Take 300 mg by mouth 3 (three) times daily.   ipratropium-albuterol 0.5-2.5 (3) MG/3ML Soln Commonly known as: DUONEB Take 3 mLs by nebulization 3 (three) times daily. What changed:   when to take this  reasons to take this   Iron 325 (65 Fe) MG Tabs Take 325 mg by mouth daily.   levothyroxine 50 MCG tablet Commonly known as:  SYNTHROID Take 50 mcg by mouth daily before breakfast.   LORazepam 2 MG tablet Commonly known as: ATIVAN Take 2 mg by mouth every 6 (six) hours as needed for anxiety.   metoprolol tartrate 25 MG tablet Commonly known as: LOPRESSOR Take 12.5 mg by mouth in the morning.   multivitamin with minerals Tabs tablet Take 1 tablet by mouth 2 (two) times daily.   oxyCODONE-acetaminophen 5-325 MG tablet Commonly known as: PERCOCET/ROXICET Take 1 tablet by mouth every 6 (six) hours as needed for severe pain. Replaces: oxyCODONE-acetaminophen 10-325 MG tablet   polyethylene glycol 17 g packet Commonly known as: MIRALAX / GLYCOLAX Take 17 g by mouth daily as needed for mild constipation.   simvastatin 20 MG tablet Commonly known as: ZOCOR Take 20 mg by mouth at bedtime.            Discharge Care Instructions  (  From admission, onward)         Start     Ordered   10/20/19 0000  If the dressing is still on your incision site when you go home, remove it on the third day after your surgery date. Remove dressing if it begins to fall off, or if it is dirty or damaged before the third day.        10/20/19 1615          Follow-up Information    Antoine Poche, MD Follow up on 11/17/2019.   Specialty: Cardiology Why: Cardiology Follow-up on 11/17/2019 at 9:00 AM. Please contact the office if needing a different date or time.  Contact information: [address] [place] [address] [phone]        Serena Colonel, MD Follow up on 10/25/2019.   Specialty: Otolaryngology Why: Appointmet at 1040am for orbital fracture follow up. Contact information: [address] [place] [address] [phone]        Mack Hook, MD Follow up in 1 week(s).   Specialty: Orthopedic Surgery Why: Office will call to schedule appointment for wrist fracture in 1-2 weeks. Contact information: [address] [place] [address] [phone]               Allergies  Allergen Reactions  . Lipitor [Atorvastatin] Itching    Consultations:  None   Procedures/Studies: DG Wrist Complete Left  Result Date: 10/19/2019 CLINICAL DATA:  Larey Seat, left wrist pain EXAM: LEFT WRIST - COMPLETE 3+ VIEW COMPARISON:  None. FINDINGS: Frontal, oblique, lateral, and ulnar deviated views of the left wrist are obtained. There is a comminuted intra-articular distal left radial fracture with mild dorsal impaction. Radiocarpal joint remains intact. Dorsal soft tissue swelling of the wrist. IMPRESSION: 1. Comminuted intra-articular distal left radial fracture with mild dorsal impaction. Electronically Signed   By: Sharlet Salina M.D.   On: 10/19/2019 17:00   CT Head Wo Contrast  Result Date: 10/19/2019 CLINICAL DATA:  Larey Seat, hit head EXAM: CT HEAD WITHOUT CONTRAST TECHNIQUE: Contiguous axial images were obtained from the base of the skull through the vertex without intravenous contrast. COMPARISON:  None. FINDINGS: Brain: No acute infarct or hemorrhage. Lateral ventricles and midline structures are unremarkable. No acute extra-axial fluid collections. No mass effect. Vascular: No hyperdense vessel or unexpected calcification. Skull: Oblique lucency through the left orbital roof involves the left frontal bone and left frontal sinus, consistent with nondisplaced fracture. There is a large scalp laceration in the left supraorbital region overlying this defect. Sinuses/Orbits: There is opacification of the left frontal sinus. Minimal fluid within the left sphenoid sinus. Other: None. IMPRESSION: 1. No acute intracranial process. 2. Nondisplaced fracture through the left orbital roof, involving the left frontal sinus. Please refer to facial CT report. 3. Left frontal scalp hematoma and laceration. Electronically Signed   By: Sharlet Salina M.D.   On: 10/19/2019 16:59   US Carotid Bilateral  Result Date: 10/20/2019 CLINICAL DATA:  Hypertension, syncope, collapse EXAM: BILATERAL  CAROTID DUPLEX ULTRASOUND TECHNIQUE: Wallace Cullens scale imaging, color Doppler and duplex ultrasound were performed of bilateral carotid and vertebral arteries in the neck. COMPARISON:  None. FINDINGS: Criteria: Quantification of carotid stenosis is based on velocity parameters that correlate the residual internal carotid diameter with NASCET-based stenosis levels, using the diameter of the distal internal carotid lumen as the denominator for stenosis measurement. The following velocity measurements were obtained: RIGHT ICA: 124/28 cm/sec CCA: 105/17 cm/sec SYSTOLIC ICA/CCA RATIO:  1.2 ECA: 148 cm/sec LEFT ICA: 131/23 cm/sec CCA: 88/16 cm/sec SYSTOLIC ICA/CCA RATIO:  1.5 ECA: 141 cm/sec RIGHT CAROTID ARTERY: Mild intimal thickening and mixed echogenicity bifurcation plaque formation. No hemodynamically significant right ICA stenosis, velocity elevation, or turbulent flow. Degree of narrowing less than 50%. RIGHT VERTEBRAL ARTERY:  Normal antegrade flow LEFT CAROTID ARTERY: Similar mild intimal thickening and mixed echogenicity plaque formation. No hemodynamically significant left ICA stenosis, velocity elevation, or turbulent flow. LEFT VERTEBRAL ARTERY:  Normal antegrade flow IMPRESSION: Mild bilateral carotid atherosclerosis. No hemodynamically significant ICA stenosis. Degree of narrowing less than 50% bilaterally by ultrasound criteria. Patent antegrade vertebral flow bilaterally Electronically Signed   By: Judie Petit.  Shick M.D.   On: 10/20/2019 11:37   DG Chest Portable 1 View  Result Date: 10/19/2019 CLINICAL DATA:  Syncopal episode and fall today. EXAM: PORTABLE CHEST 1 VIEW COMPARISON:  Radiograph 01/27/2019, chest CT 01/27/2019 FINDINGS: Chronic volume loss in the right hemithorax. Chronic rightward tracheal deviation. Emphysema and chronic hyperinflation. Chronic bronchial thickening. Unchanged heart size and mediastinal contours. No confluent airspace disease, pulmonary edema, pleural effusion or pneumothorax. No  acute osseous abnormalities are seen. IMPRESSION: 1. No acute chest findings. 2. Chronic hyperinflation and emphysema. Electronically Signed   By: Narda Rutherford M.D.   On: 10/19/2019 19:29   ECHOCARDIOGRAM COMPLETE  Result Date: 10/20/2019    ECHOCARDIOGRAM REPORT   Patient Name:   KATRINE RADICH Date of Exam: 10/20/2019 Medical Rec #:  [id number]        Height:       66.0 in Accession #:    [id number]       Weight:       130.5 lb Date of Birth:  04/28/1941         BSA:          1.668 m Patient Age:    79 years         BP:           142/70 mmHg Patient Gender: F                HR:           82 bpm. [facility] Procedure: 2D Echo Indications:    Syncope 780.2 / R55  History:        Patient has prior history of Echocardiogram examinations, most                 recent 08/16/2015. COPD, Signs/Symptoms:Syncope; Risk                 Factors:Current Smoker and Hypertension. Chronic diastolic heart                 failure.  Sonographer:    Jeryl Columbia RDCS (AE) Referring Phys: [id number] SROBONA TUBLU CHATTERJEE IMPRESSIONS  1. Left ventricular ejection fraction, by estimation, is 60 to 65%. The left ventricle has normal function. The left ventricle has no regional wall motion abnormalities. Left ventricular diastolic parameters are consistent with Grade I diastolic dysfunction (impaired relaxation). Elevated left ventricular end-diastolic pressure.  2. Right ventricular systolic function is normal. The right ventricular size is normal.  3. The mitral valve is normal in structure. Trivial mitral valve regurgitation. No evidence of mitral stenosis.  4. Epic note sent to [facility] office to get person seen ASAP given syncope and severe AS She previously had known severe AS as far back as 2017 followed in [facility] but not recently She lives in [place]  and sent to nurses and [facility] office scheduling . The aortic valve is tricuspid. Aortic valve regurgitation is trivial. Severe aortic valve stenosis.  5. The  inferior vena cava is normal in size with greater than 50% respiratory variability, suggesting right atrial pressure of 3 mmHg. FINDINGS  Left Ventricle: Left ventricular ejection fraction, by estimation, is 60 to 65%. The left ventricle has normal function. The left ventricle has no regional wall motion abnormalities. The left ventricular internal cavity size was normal in size. There is  no left ventricular hypertrophy. Left ventricular diastolic parameters are consistent with Grade I diastolic dysfunction (impaired relaxation). Elevated left ventricular end-diastolic pressure. Right Ventricle: The right ventricular size is normal. No increase in right ventricular wall thickness. Right ventricular systolic function is normal. Left Atrium: Left atrial size was normal in size. Right Atrium: Right atrial size was normal in size. Pericardium: There is no evidence of pericardial effusion. Mitral Valve: The mitral valve is normal in structure. Normal mobility of the mitral valve leaflets. Trivial mitral valve regurgitation. No evidence of mitral valve stenosis. Tricuspid Valve: The tricuspid valve is normal in structure. Tricuspid valve regurgitation is not demonstrated. No evidence of tricuspid stenosis. Aortic Valve: Epic note sent to [facility] office to get person seen ASAP given syncope and severe AS She previously had known severe AS as far back as 2017 followed in [facility] but not recently She lives in [place] and sent to nurses and [place] office scheduling. The aortic valve is tricuspid. . There is severe thickening and severe calcifcation of the aortic valve. Aortic valve regurgitation is trivial. Aortic regurgitation PHT measures 434 msec. Severe aortic stenosis is present. Severe aortic valve  annular calcification. There is severe thickening of the aortic valve. There is severe calcifcation of the aortic valve. Aortic valve mean gradient measures 60.0 mmHg. Aortic valve peak gradient measures 96.8 mmHg. Aortic  valve area, by VTI measures 0.55 cm. Pulmonic Valve: The pulmonic valve was normal in structure. Pulmonic valve regurgitation is not visualized. No evidence of pulmonic stenosis. Aorta: The aortic root is normal in size and structure. Venous: The inferior vena cava is normal in size with greater than 50% respiratory variability, suggesting right atrial pressure of 3 mmHg. IAS/Shunts: No atrial level shunt detected by color flow Doppler.  LEFT VENTRICLE PLAX 2D LVIDd:         4.25 cm  Diastology LVIDs:         2.48 cm  LV e' lateral:   6.20 cm/s LV PW:         1.19 cm  LV E/e' lateral: 16.1 LV IVS:        0.86 cm  LV e' medial:    6.96 cm/s LVOT diam:     2.10 cm  LV E/e' medial:  14.4 LV SV:         65 LV SV Index:   39 LVOT Area:     3.46 cm  RIGHT VENTRICLE RV S prime:     12.20 cm/s TAPSE (M-mode): 2.6 cm LEFT ATRIUM             Index       RIGHT ATRIUM           Index LA Vol (A2C):   52.7 ml 31.59 ml/m RA Area:     16.10 cm LA Vol (A4C):   52.4 ml 31.41 ml/m RA Volume:   46.70 ml  28.00 ml/m LA Biplane Vol: 52.1 ml 31.23 ml/m  AORTIC VALVE  AV Area (Vmax):    0.56 cm AV Area (Vmean):   0.52 cm AV Area (VTI):     0.55 cm AV Vmax:           492.00 cm/s AV Vmean:          369.667 cm/s AV VTI:            1.187 m AV Peak Grad:      96.8 mmHg AV Mean Grad:      60.0 mmHg LVOT Vmax:         80.03 cm/s LVOT Vmean:        55.767 cm/s LVOT VTI:          0.189 m LVOT/AV VTI ratio: 0.16 AI PHT:            434 msec MITRAL VALVE MV Area (PHT): 3.17 cm     SHUNTS MV Decel Time: 239 msec     Systemic VTI:  0.19 m MR Peak grad: 56.0 mmHg     Systemic Diam: 2.10 cm MR Vmax:      374.00 cm/s MV E velocity: 100.00 cm/s MV A velocity: 89.70 cm/s MV E/A ratio:  1.11 Charlton Haws MD Electronically signed by Charlton Haws MD Signature Date/Time: 10/20/2019/4:02:12 PM    Final    CT Maxillofacial WO CM  Result Date: 10/19/2019 CLINICAL DATA:  Larey Seat EXAM: CT MAXILLOFACIAL WITHOUT CONTRAST TECHNIQUE: Multidetector CT imaging of  the maxillofacial structures was performed. Multiplanar CT image reconstructions were also generated. COMPARISON:  None. FINDINGS: Osseous: There is an oblique lucency through the left orbital roof, extending into the left frontal sinus, reference images 14 through 18 of series 3, compatible with nondisplaced fracture. This is immediately deep to the large left frontal scalp laceration. There are no other acute displaced fractures. Orbits: Globes are intact. Sinuses: There is opacification of the left frontal sinus. Remaining sinuses are clear. Soft tissues: Large left supraorbital soft tissue laceration with scalp edema. Limited intracranial: No significant or unexpected finding. IMPRESSION: 1. Left supraorbital scalp hematoma and large laceration. 2. Oblique lucency through the left orbital roof extending to the left frontal sinus, consistent with nondisplaced fracture. 3. Opacification of the left frontal sinus. Electronically Signed   By: Sharlet Salina M.D.   On: 10/19/2019 16:57    Discharge Exam: Vitals:   10/20/19 1459 10/20/19 1459  BP:  (!) 142/70  Pulse:  82  Resp:  20  Temp:  98.9 F (37.2 C)  SpO2: 94% 95%   Vitals:   10/20/19 0747 10/20/19 1200 10/20/19 1459 10/20/19 1459  BP:  136/71  (!) 142/70  Pulse:  84  82  Resp:  19  20  Temp:  98.2 F (36.8 C)  98.9 F (37.2 C)  TempSrc:  Oral  Oral  SpO2: 92% 96% 94% 95%  Weight:      Height:        General: Pt is alert, awake, not in acute distress, scalp hematoma clean dry and intact with bruising around left orbit. Cardiovascular: RRR, S1/S2 +, no rubs, no gallops Respiratory: CTA bilaterally, no wheezing, no rhonchi, nasal cannula oxygen Abdominal: Soft, NT, ND, bowel sounds + Extremities: no edema, no cyanosis, left upper extremity in cast.    The results of significant diagnostics from this hospitalization (including imaging, microbiology, ancillary and laboratory) are listed below for reference.      Microbiology: Recent Results (from the past 240 hour(s))  SARS Coronavirus 2 by RT PCR (hospital order, performed  in [facility] lab) Nasopharyngeal Nasopharyngeal Swab     Status: None   Collection Time: 10/19/19  7:21 PM   Specimen: Nasopharyngeal Swab  Result Value Ref Range Status   SARS Coronavirus 2 NEGATIVE NEGATIVE Final    Comment: (NOTE) SARS-CoV-2 target nucleic acids are NOT DETECTED. The SARS-CoV-2 RNA is generally detectable in upper and lower respiratory specimens during the acute phase of infection. The lowest concentration of SARS-CoV-2 viral copies this assay can detect is 250 copies / mL. A negative result does not preclude SARS-CoV-2 infection and should not be used as the sole basis for treatment or other patient management decisions.  A negative result may occur with improper specimen collection / handling, submission of specimen other than nasopharyngeal swab, presence of viral mutation(s) within the areas targeted by this assay, and inadequate number of viral copies (<250 copies / mL). A negative result must be combined with clinical observations, patient history, and epidemiological information. Fact Sheet for Patients:   BoilerBrush.com.cyhttps://www.fda.gov/media/136312/download Fact Sheet for Healthcare Providers: https://pope.com/https://www.fda.gov/media/136313/download This test is not yet approved or cleared  by the [place] FDA and has been authorized for detection and/or diagnosis of SARS-CoV-2 by FDA under an Emergency Use Authorization (EUA).  This EUA will remain in effect (meaning this test can be used) for the duration of the COVID-19 declaration under Section 564(b)(1) of the Act, 21 U.S.C. section 360bbb-3(b)(1), unless the authorization is terminated or revoked sooner. Performed at [facility], [address] [place], [place] [address]      Labs: BNP (last 3 results) No results for input(s): BNP in the last 8760 hours. Basic Metabolic Panel: Recent  Labs  Lab 10/19/19 1528 10/20/19 0504  NA 138 139  K 4.1 3.8  CL 101 103  CO2 27 27  GLUCOSE 106* 109*  BUN 21 15  CREATININE 1.10* 0.83  CALCIUM 9.1 8.6*   Liver Function Tests: Recent Labs  Lab 10/19/19 1528 10/20/19 0504  AST 15 14*  ALT 13 11  ALKPHOS 89 86  BILITOT 0.4 0.4  PROT 6.6 6.2*  ALBUMIN 3.3* 3.0*   No results for input(s): LIPASE, AMYLASE in the last 168 hours. No results for input(s): AMMONIA in the last 168 hours. CBC: Recent Labs  Lab 10/19/19 1528 10/20/19 0504  WBC 9.2 9.4  NEUTROABS 6.3  --   HGB 10.2* 9.3*  HCT 33.3* 30.0*  MCV 94.1 92.6  PLT 294 309   Cardiac Enzymes: No results for input(s): CKTOTAL, CKMB, CKMBINDEX, TROPONINI in the last 168 hours. BNP: Invalid input(s): POCBNP CBG: No results for input(s): GLUCAP in the last 168 hours. D-Dimer No results for input(s): DDIMER in the last 72 hours. Hgb A1c No results for input(s): HGBA1C in the last 72 hours. Lipid Profile No results for input(s): CHOL, HDL, LDLCALC, TRIG, CHOLHDL, LDLDIRECT in the last 72 hours. Thyroid function studies Recent Labs    10/19/19 1536  TSH 3.419   Anemia work up No results for input(s): VITAMINB12, FOLATE, FERRITIN, TIBC, IRON, RETICCTPCT in the last 72 hours. Urinalysis    Component Value Date/Time   COLORURINE YELLOW 10/16/2015 1818   APPEARANCEUR CLEAR 10/16/2015 1818   LABSPEC 1.010 10/16/2015 1818   PHURINE 5.5 10/16/2015 1818   GLUCOSEU NEGATIVE 10/16/2015 1818   HGBUR NEGATIVE 10/16/2015 1818   BILIRUBINUR NEGATIVE 10/16/2015 1818   KETONESUR NEGATIVE 10/16/2015 1818   PROTEINUR NEGATIVE 10/16/2015 1818   NITRITE NEGATIVE 10/16/2015 1818   LEUKOCYTESUR TRACE (A) 10/16/2015 1818   Sepsis  Labs Invalid input(s): PROCALCITONIN,  WBC,  LACTICIDVEN Microbiology Recent Results (from the past 240 hour(s))  SARS Coronavirus 2 by RT PCR (hospital order, performed in [facility] hospital lab) Nasopharyngeal Nasopharyngeal Swab     Status:  None   Collection Time: 10/19/19  7:21 PM   Specimen: Nasopharyngeal Swab  Result Value Ref Range Status   SARS Coronavirus 2 NEGATIVE NEGATIVE Final    Comment: (NOTE) SARS-CoV-2 target nucleic acids are NOT DETECTED. The SARS-CoV-2 RNA is generally detectable in upper and lower respiratory specimens during the acute phase of infection. The lowest concentration of SARS-CoV-2 viral copies this assay can detect is 250 copies / mL. A negative result does not preclude SARS-CoV-2 infection and should not be used as the sole basis for treatment or other patient management decisions.  A negative result may occur with improper specimen collection / handling, submission of specimen other than nasopharyngeal swab, presence of viral mutation(s) within the areas targeted by this assay, and inadequate number of viral copies (<250 copies / mL). A negative result must be combined with clinical observations, patient history, and epidemiological information. Fact Sheet for Patients:   BoilerBrush.com.cy Fact Sheet for Healthcare Providers: https://pope.com/ This test is not yet approved or cleared  by the [place] FDA and has been authorized for detection and/or diagnosis of SARS-CoV-2 by FDA under an Emergency Use Authorization (EUA).  This EUA will remain in effect (meaning this test can be used) for the duration of the COVID-19 declaration under Section 564(b)(1) of the Act, 21 U.S.C. section 360bbb-3(b)(1), unless the authorization is terminated or revoked sooner. Performed at [facility], [address] [place], [place] [address]      Time coordinating discharge: 35 minutes  SIGNED:   Erick Blinks, DO [facility] 10/20/2019, 4:27 PM  If 7PM-7AM, please contact night-coverage [email or web address]

## 2019-10-21 NOTE — Progress Notes (Signed)
CARDIOLOGY CONSULT NOTE       Patient ID: Ashley Houston MRN: [id number] DOB/AGE: [birth year] 77 y.o.  Admit date: (Not on file) Referring Physician: Maurilio Lovely Hospitalist  Primary Physician: Lianne Moris, PA-C Primary Cardiologist: New Reason for Consultation: Aortic Stenosis   Active Problems:   * No active hospital problems. *   HPI:  79 y.o. referred from hospital after being d/c for syncope. She has history of smoking/COPD and HTN  She had been followed by [facility] cardiology and had known severe AS back in 2017 but deferred Rx. She was admitted for 24 hour observation 6/9-6/10 She had been outside in sun and was going in to urinate and blacked out No antecedent chest pain palpitations or dyspnea ? Dehydrated had been in sun a long time She had complicated laceration in scalp sutured She had a non displaced fracture of the superior left orbit To see ENT in 5 days She as given a dose of Kefsol in ER She also had a comminuted impacted fractured of her left wrist   Telemetry was benign , R/O not orthostatic   Echo read by myself 10/20/19 showed EF 60-65%  Critical AS with peak gradient 97 mmHg mean 60 mmHg AVA 0.52 cm2 DVI 0.16  Prior to her fall she had reasonable functional capacity according to her daughter she lives with She has been on home oxygen for a while but until fall has not used it much Now wearing 2.5 L at night Her sats were 87% at rest in office when not on oxygen. She has had no chest pain or history of CAD She refused to have surgery /AVR in 2017 because she was scared   She has seen Dr Pollyann Kennedy for her orbital fracture She is to see orthopedics for her left radial fracture tomorrow   She continues to smoke < ppd has smoked continuously since age 29   ROS All other systems reviewed and negative except as noted above  Past Medical History:  Diagnosis Date  . Anxiety   . Chronic diastolic heart failure (HCC)   . Chronic pain    hips back and shoulders  . COPD  (chronic obstructive pulmonary disease) (HCC)   . Depression   . Hypothyroidism 09/08/2015  . Tachycardia   . Thyroid disease     No family history on file.  Social History   Socioeconomic History  . Marital status: Widowed    Spouse name: Not on file  . Number of children: Not on file  . Years of education: Not on file  . Highest education level: Not on file  Occupational History  . Not on file  Tobacco Use  . Smoking status: Current Every Day Smoker    Packs/day: 0.50    Types: Cigarettes  . Smokeless tobacco: Never Used  Substance and Sexual Activity  . Alcohol use: No  . Drug use: No  . Sexual activity: Not on file  Other Topics Concern  . Not on file  Social History Narrative  . Not on file   Social Determinants of Health   Financial Resource Strain:   . Difficulty of Paying Living Expenses:   Food Insecurity:   . Worried About Programme researcher, broadcasting/film/video in the Last Year:   . Barista in the Last Year:   Transportation Needs:   . Freight forwarder (Medical):   Marland Kitchen Lack of Transportation (Non-Medical):   Physical Activity:   . Days of Exercise per Week:   .  Minutes of Exercise per Session:   Stress:   . Feeling of Stress :   Social Connections:   . Frequency of Communication with Friends and Family:   . Frequency of Social Gatherings with Friends and Family:   . Attends Religious Services:   . Active Member of Clubs or Organizations:   . Attends Banker Meetings:   Marland Kitchen Marital Status:   Intimate Partner Violence:   . Fear of Current or Ex-Partner:   . Emotionally Abused:   Marland Kitchen Physically Abused:   . Sexually Abused:     Past Surgical History:  Procedure Laterality Date  . ABDOMINAL HYSTERECTOMY    . CHOLECYSTECTOMY    . TONSILLECTOMY        Current Outpatient Medications:  .  albuterol (VENTOLIN HFA) 108 (90 Base) MCG/ACT inhaler, Inhale 2 puffs into the lungs every 6 (six) hours as needed for wheezing or shortness of breath. , Disp:  , Rfl:  .  ASPIRIN ADULT LOW STRENGTH 81 MG EC tablet, Take 81 mg by mouth in the morning. , Disp: , Rfl: 12 .  Calcium Carbonate-Vitamin D (CALCARB 600/D PO), Take 1 tablet by mouth in the morning., Disp: , Rfl:  .  diclofenac sodium (VOLTAREN) 1 % GEL, Apply 1 application topically 3 (three) times daily. Left knee (Patient taking differently: Apply 1 application topically 3 (three) times daily as needed. Left knee/hip), Disp: 3 Tube, Rfl: 4 .  doxepin (SINEQUAN) 100 MG capsule, Take 100 mg by mouth at bedtime. , Disp: , Rfl:  .  Ferrous Sulfate (IRON) 325 (65 Fe) MG TABS, Take 325 mg by mouth daily. , Disp: , Rfl: 2 .  gabapentin (NEURONTIN) 300 MG capsule, Take 300 mg by mouth 3 (three) times daily., Disp: , Rfl: 5 .  ipratropium-albuterol (DUONEB) 0.5-2.5 (3) MG/3ML SOLN, Take 3 mLs by nebulization 3 (three) times daily. (Patient taking differently: Take 3 mLs by nebulization every 6 (six) hours as needed (for shortness of breath). ), Disp: 360 mL, Rfl: 2 .  levothyroxine (SYNTHROID) 50 MCG tablet, Take 50 mcg by mouth daily before breakfast. , Disp: , Rfl:  .  LORazepam (ATIVAN) 2 MG tablet, Take 2 mg by mouth every 6 (six) hours as needed for anxiety., Disp: , Rfl:  .  metoprolol tartrate (LOPRESSOR) 25 MG tablet, Take 12.5 mg by mouth in the morning. , Disp: , Rfl:  .  Multiple Vitamin (MULTIVITAMIN WITH MINERALS) TABS tablet, Take 1 tablet by mouth 2 (two) times daily., Disp: , Rfl:  .  polyethylene glycol (MIRALAX / GLYCOLAX) packet, Take 17 g by mouth daily as needed for mild constipation., Disp: 14 each, Rfl: 0 .  simvastatin (ZOCOR) 20 MG tablet, Take 20 mg by mouth at bedtime., Disp: , Rfl: 5    Physical Exam: Blood pressure 96/68, pulse 79, height 5\' 6"  (1.676 m), weight 126 lb 6.4 oz (57.3 kg), SpO2 (!) 84 %.    Affect appropriate Chronically ill female  HEENT: recent left orbital fracture with bruising  Neck supple with no adenopathy JVP normal no bruits no thyromegaly Lungs  clear with no wheezing and good diaphragmatic motion Heart:  S1/S2 muffled severe late peaking AS murmur, no rub, gallop or click PMI normal Abdomen: benighn, BS positve, no tenderness, no AAA no bruit.  No HSM or HJR Distal pulses intact with no bruits No edema Neuro non-focal Skin warm and dry Left wrist in cast with fracture from recent fall    Labs:  Lab Results  Component Value Date   WBC 9.4 10/20/2019   HGB 9.3 (L) 10/20/2019   HCT 30.0 (L) 10/20/2019   MCV 92.6 10/20/2019   PLT 309 10/20/2019    Recent Labs  Lab 10/20/19 0504  NA 139  K 3.8  CL 103  CO2 27  BUN 15  CREATININE 0.83  CALCIUM 8.6*  PROT 6.2*  BILITOT 0.4  ALKPHOS 86  ALT 11  AST 14*  GLUCOSE 109*   Lab Results  Component Value Date   TROPONINI <0.03 09/07/2015   No results found for: CHOL No results found for: HDL No results found for: LDLCALC No results found for: TRIG No results found for: CHOLHDL No results found for: LDLDIRECT    Radiology: DG Wrist Complete Left  Result Date: 10/19/2019 CLINICAL DATA:  [place], left wrist pain EXAM: LEFT WRIST - COMPLETE 3+ VIEW COMPARISON:  None. FINDINGS: Frontal, oblique, lateral, and ulnar deviated views of the left wrist are obtained. There is a comminuted intra-articular distal left radial fracture with mild dorsal impaction. Radiocarpal joint remains intact. Dorsal soft tissue swelling of the wrist. IMPRESSION: 1. Comminuted intra-articular distal left radial fracture with mild dorsal impaction. Electronically Signed   By: Randa Ngo M.D.   On: 10/19/2019 17:00   CT Head Wo Contrast  Result Date: 10/19/2019 CLINICAL DATA:  [place], hit head EXAM: CT HEAD WITHOUT CONTRAST TECHNIQUE: Contiguous axial images were obtained from the base of the skull through the vertex without intravenous contrast. COMPARISON:  None. FINDINGS: Brain: No acute infarct or hemorrhage. Lateral ventricles and midline structures are unremarkable. No acute extra-axial fluid  collections. No mass effect. Vascular: No hyperdense vessel or unexpected calcification. Skull: Oblique lucency through the left orbital roof involves the left frontal bone and left frontal sinus, consistent with nondisplaced fracture. There is a large scalp laceration in the left supraorbital region overlying this defect. Sinuses/Orbits: There is opacification of the left frontal sinus. Minimal fluid within the left sphenoid sinus. Other: None. IMPRESSION: 1. No acute intracranial process. 2. Nondisplaced fracture through the left orbital roof, involving the left frontal sinus. Please refer to facial CT report. 3. Left frontal scalp hematoma and laceration. Electronically Signed   By: Randa Ngo M.D.   On: 10/19/2019 16:59   US Carotid Bilateral  Result Date: 10/20/2019 CLINICAL DATA:  Hypertension, syncope, collapse EXAM: BILATERAL CAROTID DUPLEX ULTRASOUND TECHNIQUE: Pearline Cables scale imaging, color Doppler and duplex ultrasound were performed of bilateral carotid and vertebral arteries in the neck. COMPARISON:  None. FINDINGS: Criteria: Quantification of carotid stenosis is based on velocity parameters that correlate the residual internal carotid diameter with NASCET-based stenosis levels, using the diameter of the distal internal carotid lumen as the denominator for stenosis measurement. The following velocity measurements were obtained: RIGHT ICA: 124/28 cm/sec CCA: 010/27 cm/sec SYSTOLIC ICA/CCA RATIO:  1.2 ECA: 148 cm/sec LEFT ICA: 131/23 cm/sec CCA: 25/36 cm/sec SYSTOLIC ICA/CCA RATIO:  1.5 ECA: 141 cm/sec RIGHT CAROTID ARTERY: Mild intimal thickening and mixed echogenicity bifurcation plaque formation. No hemodynamically significant right ICA stenosis, velocity elevation, or turbulent flow. Degree of narrowing less than 50%. RIGHT VERTEBRAL ARTERY:  Normal antegrade flow LEFT CAROTID ARTERY: Similar mild intimal thickening and mixed echogenicity plaque formation. No hemodynamically significant left ICA  stenosis, velocity elevation, or turbulent flow. LEFT VERTEBRAL ARTERY:  Normal antegrade flow IMPRESSION: Mild bilateral carotid atherosclerosis. No hemodynamically significant ICA stenosis. Degree of narrowing less than 50% bilaterally by ultrasound criteria. Patent antegrade vertebral flow bilaterally Electronically Signed  By: Osvaldo ShipperM.  Shick M.D.   On: 10/20/2019 11:37   DG Chest Portable 1 View  Result Date: 10/19/2019 CLINICAL DATA:  Syncopal episode and fall today. EXAM: PORTABLE CHEST 1 VIEW COMPARISON:  Radiograph 01/27/2019, chest CT 01/27/2019 FINDINGS: Chronic volume loss in the right hemithorax. Chronic rightward tracheal deviation. Emphysema and chronic hyperinflation. Chronic bronchial thickening. Unchanged heart size and mediastinal contours. No confluent airspace disease, pulmonary edema, pleural effusion or pneumothorax. No acute osseous abnormalities are seen. IMPRESSION: 1. No acute chest findings. 2. Chronic hyperinflation and emphysema. Electronically Signed   By: Narda RutherfordMelanie  Sanford M.D.   On: 10/19/2019 19:29   ECHOCARDIOGRAM COMPLETE  Result Date: 10/20/2019    ECHOCARDIOGRAM REPORT   Patient Name:   Ashley Houston Date of Exam: 10/20/2019 Medical Rec #:  [id number]        Height:       66.0 in Accession #:    [id number]       Weight:       130.5 lb Date of Birth:  02/13/1941         BSA:          1.668 m Patient Age:    79 years         BP:           142/70 mmHg Patient Gender: F                HR:           82 bpm. [facility] Procedure: 2D Echo Indications:    Syncope 780.2 / R55  History:        Patient has prior history of Echocardiogram examinations, most                 recent 08/16/2015. COPD, Signs/Symptoms:Syncope; Risk                 Factors:Current Smoker and Hypertension. Chronic diastolic heart                 failure.  Sonographer:    Jeryl ColumbiaJohanna Elliott RDCS (AE) Referring Phys: [id number] SROBONA TUBLU CHATTERJEE IMPRESSIONS  1. Left ventricular ejection fraction, by  estimation, is 60 to 65%. The left ventricle has normal function. The left ventricle has no regional wall motion abnormalities. Left ventricular diastolic parameters are consistent with Grade I diastolic dysfunction (impaired relaxation). Elevated left ventricular end-diastolic pressure.  2. Right ventricular systolic function is normal. The right ventricular size is normal.  3. The mitral valve is normal in structure. Trivial mitral valve regurgitation. No evidence of mitral stenosis.  4. [facility] given syncope and severe AS She previously had known severe AS as far back as 2017 [facility] [place] She lives in [place] and sent to nurses and [place] office scheduling . The aortic valve is tricuspid. Aortic valve regurgitation is trivial. Severe aortic valve stenosis.  5. The inferior vena cava is normal in size with greater than 50% respiratory variability, suggesting right atrial pressure of 3 mmHg. FINDINGS  Left Ventricle: Left ventricular ejection fraction, by estimation, is 60 to 65%. The left ventricle has normal function. The left ventricle has no regional wall motion abnormalities. The left ventricular internal cavity size was normal in size. There is  no left ventricular hypertrophy. Left ventricular diastolic parameters are consistent with Grade I diastolic dysfunction (impaired relaxation). Elevated left ventricular end-diastolic pressure. Right Ventricle: The right  ventricular size is normal. No increase in right ventricular wall thickness. Right ventricular systolic function is normal. Left Atrium: Left atrial size was normal in size. Right Atrium: Right atrial size was normal in size. Pericardium: There is no evidence of pericardial effusion. Mitral Valve: The mitral valve is normal in structure. Normal mobility of the mitral valve leaflets. Trivial mitral valve regurgitation. No evidence of mitral valve stenosis. Tricuspid Valve: The  tricuspid valve is normal in structure. Tricuspid valve regurgitation is not demonstrated. No evidence of tricuspid stenosis. Aortic Valve: Epic note sent to [place] office to get person seen ASAP given syncope and severe AS She previously had known severe AS as far back as 2017 followed in [facility] but not recently She lives in [place] and sent to nurses and [place] office scheduling. The aortic valve is tricuspid. . There is severe thickening and severe calcifcation of the aortic valve. Aortic valve regurgitation is trivial. Aortic regurgitation PHT measures 434 msec. Severe aortic stenosis is present. Severe aortic valve  annular calcification. There is severe thickening of the aortic valve. There is severe calcifcation of the aortic valve. Aortic valve mean gradient measures 60.0 mmHg. Aortic valve peak gradient measures 96.8 mmHg. Aortic valve area, by VTI measures 0.55 cm. Pulmonic Valve: The pulmonic valve was normal in structure. Pulmonic valve regurgitation is not visualized. No evidence of pulmonic stenosis. Aorta: The aortic root is normal in size and structure. Venous: The inferior vena cava is normal in size with greater than 50% respiratory variability, suggesting right atrial pressure of 3 mmHg. IAS/Shunts: No atrial level shunt detected by color flow Doppler.  LEFT VENTRICLE PLAX 2D LVIDd:         4.25 cm  Diastology LVIDs:         2.48 cm  LV e' lateral:   6.20 cm/s LV PW:         1.19 cm  LV E/e' lateral: 16.1 LV IVS:        0.86 cm  LV e' medial:    6.96 cm/s LVOT diam:     2.10 cm  LV E/e' medial:  14.4 LV SV:         65 LV SV Index:   39 LVOT Area:     3.46 cm  RIGHT VENTRICLE RV S prime:     12.20 cm/s TAPSE (M-mode): 2.6 cm LEFT ATRIUM             Index       RIGHT ATRIUM           Index LA Vol (A2C):   52.7 ml 31.59 ml/m RA Area:     16.10 cm LA Vol (A4C):   52.4 ml 31.41 ml/m RA Volume:   46.70 ml  28.00 ml/m LA Biplane Vol: 52.1 ml 31.23 ml/m  AORTIC VALVE AV Area (Vmax):    0.56 cm AV  Area (Vmean):   0.52 cm AV Area (VTI):     0.55 cm AV Vmax:           492.00 cm/s AV Vmean:          369.667 cm/s AV VTI:            1.187 m AV Peak Grad:      96.8 mmHg AV Mean Grad:      60.0 mmHg LVOT Vmax:         80.03 cm/s LVOT Vmean:        55.767 cm/s LVOT VTI:  0.189 m LVOT/AV VTI ratio: 0.16 AI PHT:            434 msec MITRAL VALVE MV Area (PHT): 3.17 cm     SHUNTS MV Decel Time: 239 msec     Systemic VTI:  0.19 m MR Peak grad: 56.0 mmHg     Systemic Diam: 2.10 cm MR Vmax:      374.00 cm/s MV E velocity: 100.00 cm/s MV A velocity: 89.70 cm/s MV E/A ratio:  1.11 Charlton Haws MD Electronically signed by Charlton Haws MD Signature Date/Time: 10/20/2019/4:02:12 PM    Final    CT Maxillofacial WO CM  Result Date: 10/19/2019 CLINICAL DATA:  Larey Seat EXAM: CT MAXILLOFACIAL WITHOUT CONTRAST TECHNIQUE: Multidetector CT imaging of the maxillofacial structures was performed. Multiplanar CT image reconstructions were also generated. COMPARISON:  None. FINDINGS: Osseous: There is an oblique lucency through the left orbital roof, extending into the left frontal sinus, reference images 14 through 18 of series 3, compatible with nondisplaced fracture. This is immediately deep to the large left frontal scalp laceration. There are no other acute displaced fractures. Orbits: Globes are intact. Sinuses: There is opacification of the left frontal sinus. Remaining sinuses are clear. Soft tissues: Large left supraorbital soft tissue laceration with scalp edema. Limited intracranial: No significant or unexpected finding. IMPRESSION: 1. Left supraorbital scalp hematoma and large laceration. 2. Oblique lucency through the left orbital roof extending to the left frontal sinus, consistent with nondisplaced fracture. 3. Opacification of the left frontal sinus. Electronically Signed   By: Sharlet Salina M.D.   On: 10/19/2019 16:57    EKG: SR rate 69 normal    ASSESSMENT AND PLAN:   1. Critical AS:  In setting of syncope  Long discussion with patient about the lethality of her condition especially considering she has had severe AS for a while and now presents with unprovoked syncope. Needs right and left heart cath with then evaluation for TAVR/SAVR. I don't think given the severity of her lung disease and age that she would be a good SAVR candidate   She had carotid US 6/11 that only showed plaque no stenosis  Explained at length the process to be considered for TAVR including labs, right and left cath, CT scans and surgical consult   2. COPD:  Will order PfTls CXR 10/20/19 showed hyperinflation with emphysema Encouraged her to wear her oxygen and stop smoking   3. ENT:  F/u regarding non displaced left orbital fracture eye movements are not impaired  4. Ortho:  F/u ortho for left wrist fracture   5. HLD:  Continue statin   6. Thyroid:  Continue replacement TSH 3.4 on 10/19/19   Time:  90 minutes spent reviewing patients chart, echo previous records [facility] direct patient interview with daughters exam and composing note as well as setting up tests/orders    Signed: Charlton Haws 10/26/2019, 2:41 PM

## 2019-10-25 DIAGNOSIS — S0285XD Fracture of orbit, unspecified, subsequent encounter for fracture with routine healing: Secondary | ICD-10-CM | POA: Diagnosis not present

## 2019-10-25 DIAGNOSIS — S0101XD Laceration without foreign body of scalp, subsequent encounter: Secondary | ICD-10-CM | POA: Diagnosis not present

## 2019-10-26 ENCOUNTER — Other Ambulatory Visit: Payer: Self-pay

## 2019-10-26 ENCOUNTER — Encounter: Payer: Self-pay | Admitting: Cardiovascular Disease

## 2019-10-26 ENCOUNTER — Ambulatory Visit: Payer: Medicare HMO | Admitting: Cardiovascular Disease

## 2019-10-26 VITALS — BP 96/68 | HR 79 | Ht 66.0 in | Wt 126.4 lb

## 2019-10-26 DIAGNOSIS — J439 Emphysema, unspecified: Secondary | ICD-10-CM | POA: Diagnosis not present

## 2019-10-26 DIAGNOSIS — R55 Syncope and collapse: Secondary | ICD-10-CM

## 2019-10-26 DIAGNOSIS — I5032 Chronic diastolic (congestive) heart failure: Secondary | ICD-10-CM

## 2019-10-26 DIAGNOSIS — I10 Essential (primary) hypertension: Secondary | ICD-10-CM | POA: Diagnosis not present

## 2019-10-26 DIAGNOSIS — I350 Nonrheumatic aortic (valve) stenosis: Secondary | ICD-10-CM

## 2019-10-26 MED ORDER — METOPROLOL TARTRATE 50 MG PO TABS
ORAL_TABLET | ORAL | 0 refills | Status: DC
Start: 2019-10-26 — End: 2021-07-24

## 2019-10-26 NOTE — Patient Instructions (Addendum)
Medication Instructions:  Your physician recommends that you continue on your current medications as directed. Please refer to the Current Medication list given to you today.  *If you need a refill on your cardiac medications before your next appointment, please call your pharmacy*   Lab Work: TDOAY:  [facility] [place].. THEY WILL TELL YOU WHEN TO COME:  [facility]  If you have labs (blood work) drawn today and your tests are completely normal, you will receive your results only by: Marland Kitchen MyChart Message (if you have MyChart) OR . A paper copy in the mail If you have any lab test that is abnormal or we need to change your treatment, we will call you to review the results.   Testing/Procedures: Your physician has recommended that you have a pulmonary function test. Pulmonary Function Tests are a group of tests that measure how well air moves in and out of your lungs.   Your physician has requested that you have a cardiac catheterization. Cardiac catheterization is used to diagnose and/or treat various heart conditions. Doctors may recommend this procedure for a number of different reasons. The most common reason is to evaluate chest pain. Chest pain can be a symptom of coronary artery disease (CAD), and cardiac catheterization can show whether plaque is narrowing or blocking your heart's arteries. This procedure is also used to evaluate the valves, as well as measure the blood flow and oxygen levels in different parts of your heart. For further information please visit HugeFiesta.[email or web address]. Please follow instruction sheet, BELOW:      [place] [address] Dept: [phone] Loc: [place]  10/26/2019  You are scheduled for a Cardiac Catheterization on Tuesday, June 22 with Dr. Sherren Mocha.  1. Please arrive at the [facility] (Main Entrance A) at [facility]: [address] [place], [place] [address] at 5:30 AM (This time is two hours before your procedure to ensure your preparation). Free valet parking service is available.   Special note: Every effort is made to have your procedure done on time. Please understand that emergencies sometimes delay scheduled procedures.  2. Diet: Do not eat solid foods after midnight.  The patient may have clear liquids until 5am upon the day of the procedure.  3. Labs: You will need to have blood drawn on TODAY. You do not need to be fasting.  4. Medication instructions in preparation for your procedure:   Contrast Allergy: No  On the morning of your procedure, take your Aspirin and any morning medicines NOT listed above.  You may use sips of water.  5. Plan for one night stay--bring personal belongings. 6. Bring a current list of your medications and current insurance cards. 7. You MUST have a responsible person to drive you home. 8. Someone MUST be with you the first 24 hours after you arrive home or your discharge will be delayed. 9. Please wear clothes that are easy to get on and off and wear slip-on shoes.  Thank you for allowing [place] to care for you!   -- [facility]  Your physician has requested that you have cardiac CT. Cardiac computed tomography (CT) is a painless test that uses an x-ray machine to take clear, detailed pictures of your heart. For further information please visit [email or web address].[email or web address]. Please follow instruction sheet as given.    Your cardiac CT will be scheduled  at one of the below locations:   [facility] [address] [place], [place] [address] [phone]  If scheduled at [facility], please arrive at the [facility] main entrance of [facility] 30 minutes prior to test start time. Proceed to the [facility] Radiology Department (first floor) to check-in and test prep.  Please follow these  instructions carefully (unless otherwise directed):  On the Night Before the Test: . Be sure to Drink plenty of water. . Do not consume any caffeinated/decaffeinated beverages or chocolate 12 hours prior to your test. . Do not take any antihistamines 12 hours prior to your test.  On the Day of the Test: . Drink plenty of water. Do not drink any water within one hour of the test. . Do not eat any food 4 hours prior to the test. . You may take your regular medications prior to the test.  . Take metoprolol (Lopressor) two hours prior to test. . FEMALES- please wear underwire-free bra if available        After the Test: . Drink plenty of water. . After receiving IV contrast, you may experience a mild flushed feeling. This is normal. . On occasion, you may experience a mild rash up to 24 hours after the test. This is not dangerous. If this occurs, you can take Benadryl 25 mg and increase your fluid intake. . If you experience trouble breathing, this can be serious. If it is severe call 911 IMMEDIATELY. If it is mild, please call our office. . If you take any of these medications: Glipizide/Metformin, Avandament, Glucavance, please do not take 48 hours after completing test unless otherwise instructed.   Once we have confirmed authorization from your insurance company, we will call you to set up a date and time for your test.   For non-scheduling related questions, please contact the cardiac imaging nurse navigator should you have any questions/concerns: Marchia Bond, Cardiac Imaging Nurse Navigator Burley Saver, Interim Cardiac Imaging Nurse [place] [facility] Direct Office Dial: [phone]   For scheduling needs, including cancellations and rescheduling, please call [phone].       Follow-Up: At [facility], you and your health needs are our priority.  As part of our continuing mission to provide you with exceptional heart care, we have created  designated Provider Care Teams.  These Care Teams include your primary Cardiologist (physician) and Advanced Practice Providers (APPs -  Physician Assistants and Nurse Practitioners) who all work together to provide you with the care you need, when you need it.  We recommend signing up for the patient portal called "MyChart".  Sign up information is provided on this After Visit Summary.  MyChart is used to connect with patients for Virtual Visits (Telemedicine).  Patients are able to view lab/test results, encounter notes, upcoming appointments, etc.  Non-urgent messages can be sent to your provider as well.   To learn more about what you can do with MyChart, go to [email or web address].    Your next appointment:   1 week(s)  The format for your next appointment:   In Person  Provider:   Dr. Roxy Manns   Other Instructions   Pulmonary Function Tests Pulmonary function tests (PFTs) are used to measure how well your lungs work, find out what is causing your lung problems, and figure out the best treatment for you. You may have PFTs:  When you have an illness involving the lungs.  To follow changes in your  lung function over time if you have a chronic lung disease.  If you are an Nature conservation officer. This checks the effects of being exposed to chemicals over a long period of time.  To check lung function before having surgery or other procedures.  To check your lungs if you smoke.  To check if prescribed medicines or treatments are helping your lungs. Your results will be compared to the expected lung function of someone with healthy lungs who is similar to you in:  Age.  Gender.  Height.  Weight.  Race or ethnicity. This is done to show how your lungs compare to normal lung function (percent predicted). This is how your health care provider knows if your lung function is normal or not. If you have had PFTs done before, your health care provider will compare your current  results with past results. This shows if your lung function is better, worse, or the same as before. Tell a health care provider about:  Any allergies you have.  All medicines you are taking, including inhaler or nebulizer medicines, vitamins, herbs, eye drops, creams, and over-the-counter medicines.  Any blood disorders you have.  Any surgeries you have had, especially recent eye surgery, abdominal surgery, or chest surgery. These can make PFTs difficult or unsafe.  Any medical conditions you have, including chest pain or heart problems, tuberculosis, or respiratory infections such as pneumonia, a cold, or the flu.  Any fear of being in closed spaces (claustrophobia). Some of your tests may be in a closed space. What are the risks? Generally, this is a safe procedure. However, problems may occur, including:  Light-headedness due to over-breathing (hyperventilation).  An asthma attack from deep breathing.  A collapsed lung. What happens before the procedure?  Take over-the-counter and prescription medicines only as told by your health care provider. If you take inhaler or nebulizer medicines, ask your health care provider which medicines you should take on the day of your testing. Some inhaler medicines may interfere with PFTs if they are taken shortly before the tests.  Follow your health care provider's instructions on eating and drinking restrictions. This may include avoiding eating large meals and drinking alcohol before the testing.  Do not use any products that contain nicotine or tobacco, such as cigarettes and e-cigarettes. If you need help quitting, ask your health care provider.  Wear comfortable clothing that will not interfere with breathing. What happens during the procedure?   You will be given a soft nose clip to wear. This is done so all of your breaths will go through your mouth instead of your nose.  You will be given a germ-free (sterile) mouthpiece. It will be  attached to a machine that measures your breathing (spirometer).  You will be asked to do various breathing maneuvers. The maneuvers will be done by breathing in (inhaling) and breathing out (exhaling). You may be asked to repeat the maneuvers several times before the testing is done.  It is important to follow the instructions exactly to get accurate results. Make sure to blow as hard and as fast as you can when you are told to do so.  You may be given a medicine that makes the small air passages in your lungs larger (bronchodilator) after testing has been done. This medicine will make it easier for you to breathe.  The tests will be repeated after the bronchodilator has taken effect.  You will be monitored carefully during the procedure for faintness, dizziness, trouble breathing, or  any other problems. The procedure may vary among health care providers and hospitals. What happens after the procedure?  It is up to you to get your test results. Ask your health care provider, or the department that is doing the tests, when your results will be ready. After you have received your test results, talk with your health care provider about treatment options, if necessary. Summary  Pulmonary function tests (PFTs) are used to measure how well your lungs work, find out what is causing your lung problems, and figure out the best treatment for you.  Wear comfortable clothing that will not interfere with breathing.  It is up to you to get your test results. After you have received them, talk with your health care provider about treatment options, if necessary. This information is not intended to replace advice given to you by your health care provider. Make sure you discuss any questions you have with your health care provider. Document Revised: 04/25/2016 Document Reviewed: 03/20/2016 Elsevier Patient Education  2020 [employer].

## 2019-10-27 ENCOUNTER — Other Ambulatory Visit: Payer: Self-pay

## 2019-10-27 DIAGNOSIS — I350 Nonrheumatic aortic (valve) stenosis: Secondary | ICD-10-CM

## 2019-10-27 DIAGNOSIS — S52572A Other intraarticular fracture of lower end of left radius, initial encounter for closed fracture: Secondary | ICD-10-CM | POA: Diagnosis not present

## 2019-10-31 ENCOUNTER — Telehealth: Payer: Self-pay | Admitting: *Deleted

## 2019-10-31 NOTE — Telephone Encounter (Signed)
Make sure patient knows she will die of her lung or heart disease if she does not have them addressed They will not get better on there own or with medicine

## 2019-10-31 NOTE — Telephone Encounter (Signed)
Called placed to patient to review procedure instructions for [facility] scheduled 11/01/19. Pt states she has decided she does not want to have R/LHC. Pt states her daughter, Orie Rout, was taking care of cancelling procedure and put her on the telephone with me. Diane  states pt has decided, if any further procedures/surgery were  recommended based on results of R/LHC, that pt  would not want to proceed, therefore has decided not to have R/LHC done. Pt's daughter states pt understood since episode of syncope,  R/LHC was recommended for further evaluation of aortic stenosis, and, if recommended, she understood how TAVR procedure would be done.  Of note, Diane states pt decided not to go to lab 10/26/19 for BMP/CBC.  At patient's request,  I will cancel [facility] 11/01/19. Diane advised I will forward to Dr Eden Emms, Dr Excell Seltzer, and Julieta Gutting, RN TAVR Navigator  for their review.

## 2019-10-31 NOTE — Telephone Encounter (Signed)
I read Dr.Nishan's message to patient and her comment was "ok"

## 2019-11-01 ENCOUNTER — Ambulatory Visit (HOSPITAL_COMMUNITY): Admission: RE | Admit: 2019-11-01 | Payer: Medicare HMO | Source: Home / Self Care | Admitting: Cardiovascular Disease

## 2019-11-01 ENCOUNTER — Encounter (HOSPITAL_COMMUNITY): Admission: RE | Payer: Self-pay | Source: Home / Self Care

## 2019-11-01 SURGERY — RIGHT/LEFT HEART CATH AND CORONARY ANGIOGRAPHY
Anesthesia: LOCAL

## 2019-11-01 NOTE — Telephone Encounter (Signed)
I attempted to reach the pt to discuss diagnosis of Aortic Stenosis and TAVR.  I left the pt a voicemail to feel free to contact me for further discussion.

## 2019-11-02 ENCOUNTER — Encounter: Payer: Medicare HMO | Attending: Physical Medicine & Rehabilitation | Admitting: Registered Nurse

## 2019-11-02 ENCOUNTER — Encounter: Payer: Self-pay | Admitting: Registered Nurse

## 2019-11-02 ENCOUNTER — Other Ambulatory Visit: Payer: Self-pay

## 2019-11-02 VITALS — BP 101/57 | HR 75 | Temp 97.5°F | Ht 66.0 in | Wt 127.2 lb

## 2019-11-02 DIAGNOSIS — G8929 Other chronic pain: Secondary | ICD-10-CM | POA: Diagnosis not present

## 2019-11-02 DIAGNOSIS — M1712 Unilateral primary osteoarthritis, left knee: Secondary | ICD-10-CM

## 2019-11-02 DIAGNOSIS — E039 Hypothyroidism, unspecified: Secondary | ICD-10-CM | POA: Insufficient documentation

## 2019-11-02 DIAGNOSIS — R0902 Hypoxemia: Secondary | ICD-10-CM | POA: Diagnosis not present

## 2019-11-02 DIAGNOSIS — I5032 Chronic diastolic (congestive) heart failure: Secondary | ICD-10-CM | POA: Insufficient documentation

## 2019-11-02 DIAGNOSIS — M5136 Other intervertebral disc degeneration, lumbar region: Secondary | ICD-10-CM

## 2019-11-02 DIAGNOSIS — Z79891 Long term (current) use of opiate analgesic: Secondary | ICD-10-CM | POA: Diagnosis not present

## 2019-11-02 DIAGNOSIS — R000 Tachycardia, unspecified: Secondary | ICD-10-CM | POA: Diagnosis not present

## 2019-11-02 DIAGNOSIS — F419 Anxiety disorder, unspecified: Secondary | ICD-10-CM | POA: Diagnosis not present

## 2019-11-02 DIAGNOSIS — Z5181 Encounter for therapeutic drug level monitoring: Secondary | ICD-10-CM | POA: Diagnosis not present

## 2019-11-02 DIAGNOSIS — M545 Low back pain: Secondary | ICD-10-CM | POA: Insufficient documentation

## 2019-11-02 DIAGNOSIS — F17210 Nicotine dependence, cigarettes, uncomplicated: Secondary | ICD-10-CM | POA: Insufficient documentation

## 2019-11-02 DIAGNOSIS — M51369 Other intervertebral disc degeneration, lumbar region without mention of lumbar back pain or lower extremity pain: Secondary | ICD-10-CM

## 2019-11-02 DIAGNOSIS — J449 Chronic obstructive pulmonary disease, unspecified: Secondary | ICD-10-CM | POA: Insufficient documentation

## 2019-11-02 DIAGNOSIS — G894 Chronic pain syndrome: Secondary | ICD-10-CM | POA: Diagnosis not present

## 2019-11-02 DIAGNOSIS — F329 Major depressive disorder, single episode, unspecified: Secondary | ICD-10-CM | POA: Insufficient documentation

## 2019-11-02 DIAGNOSIS — M7062 Trochanteric bursitis, left hip: Secondary | ICD-10-CM

## 2019-11-02 DIAGNOSIS — M5416 Radiculopathy, lumbar region: Secondary | ICD-10-CM | POA: Diagnosis not present

## 2019-11-02 MED ORDER — OXYCODONE-ACETAMINOPHEN 10-325 MG PO TABS: 1.0000 | ORAL_TABLET | Freq: Three times a day (TID) | ORAL | 0 refills | Status: DC | PRN

## 2019-11-02 NOTE — Progress Notes (Signed)
Subjective:    Patient ID: Ashley Houston, female    DOB: 12/19/1940, 79 y.o.   MRN: [id number]  HPI: Ashley Houston is a 79 y.o. female who returns for follow up appointment for chronic pain and medication refill. She states her pain is located in her lower back radiating into her left hip and left lower extremity. Also reports left knee pain. She  rates her pain 6. Her current exercise regime is walking in the home with family, she arrived in wheelchair.   Ashley Houston arrive with oxygen desaturation, she refuses ED evaluation, she states she will put her Oxygen on when she goes home. Also stated she called her [facility] to ask for a portable tank, she is on a waiting list. I asked Ashley Houston and her daughter to call the [facility] again, they verbalize understanding.   Ashley Houston was admitted to [facility] on 10/19/2019 and discharged home on 10/20/2019 for Syncope and scalp laceration, discharge note was reviewed. Discharge note stated Oxycodone 5 mg, Ashley Houston reports she never received Oxycodone 5 mg, this provider called [facility] they never dispense Oxycodone 5 mg. Ashley Houston resumed her Oxycodone 10 mg since her discharged she and her daughter states.    Ashley Houston Morphine equivalent is  45.00  MME.  She  is also prescribed Lorazepam  by Lanelle Bal Pa-[facility]. We have discussed the black box warning of using opioids and benzodiazepines. I highlighted the dangers of using these drugs together and discussed the adverse events including respiratory suppression, overdose, cognitive impairment and importance of compliance with current regimen. We will continue to monitor and adjust as indicated.   Last Oral Swab was Performed on 09/28/2019, it was consistent.    Pain Inventory Average Pain 8 Pain Right Now 6 My pain is intermittent, dull, stabbing and aching  In the last 24 hours, has pain interfered with the following? General activity 9 Relation with others  9 Enjoyment of life 9 What TIME of day is your pain at its worst? daytime,morning & evening. Sleep (in general) Good  Pain is worse with: walking, bending and standing Pain improves with: rest, heat/ice and medication Relief from Meds: 10  Mobility use a walker how many minutes can you walk? not walking well. ability to climb steps?  yes do you drive?  no needs help with transfers Do you have any goals in this area?  yes  Function retired I need assistance with the following:  feeding, dressing, bathing, toileting, meal prep, household duties and shopping Do you have any goals in this area?  yes  Neuro/Psych numbness trouble walking spasms depression anxiety  Prior Studies Any changes since last visit?  yes Fall at home 10/2019 studies at [facility].  Physicians involved in your care    History reviewed. No pertinent family history. Social History   Socioeconomic History  . Marital status: Widowed    Spouse name: Not on file  . Number of children: Not on file  . Years of education: Not on file  . Highest education level: Not on file  Occupational History  . Not on file  Tobacco Use  . Smoking status: Current Every Day Smoker    Packs/day: 0.50    Types: Cigarettes  . Smokeless tobacco: Never Used  Substance and Sexual Activity  . Alcohol use: No  . Drug use: No  . Sexual activity: Not on file  Other Topics Concern  . Not on file  Social History  Narrative  . Not on file   Social Determinants of Health   Financial Resource Strain:   . Difficulty of Paying Living Expenses:   Food Insecurity:   . Worried About Programme researcher, broadcasting/film/video in the Last Year:   . Barista in the Last Year:   Transportation Needs:   . Freight forwarder (Medical):   Marland Kitchen Lack of Transportation (Non-Medical):   Physical Activity:   . Days of Exercise per Week:   . Minutes of Exercise per Session:   Stress:   . Feeling of Stress :   Social Connections:   .  Frequency of Communication with Friends and Family:   . Frequency of Social Gatherings with Friends and Family:   . Attends Religious Services:   . Active Member of Clubs or Organizations:   . Attends Banker Meetings:   Marland Kitchen Marital Status:    Past Surgical History:  Procedure Laterality Date  . ABDOMINAL HYSTERECTOMY    . CHOLECYSTECTOMY    . TONSILLECTOMY     Past Medical History:  Diagnosis Date  . Anxiety   . Chronic diastolic heart failure (HCC)   . Chronic pain    hips back and shoulders  . COPD (chronic obstructive pulmonary disease) (HCC)   . Depression   . Hypothyroidism 09/08/2015  . Tachycardia   . Thyroid disease    BP (!) 85/47   Pulse 87   Temp (!) 97.5 F (36.4 C)   Ht 5\' 6"  (1.676 m)   Wt 127 lb 3.2 oz (57.7 kg)   SpO2 (!) 86%   BMI 20.53 kg/m   Opioid Risk Score:   Fall Risk Score:  `1  Depression screen PHQ 2/9  Depression screen [facility] 2/9 04/01/2018 03/01/2018 02/01/2018 01/08/2018 08/26/2017 07/31/2017 02/03/2017  Decreased Interest 0 0 0 0 0 1 1  Down, Depressed, Hopeless 0 0 0 0 0 1 1  PHQ - 2 Score 0 0 0 0 0 2 2  Altered sleeping - - - - 0 - -  Tired, decreased energy - - - - 0 - -  Change in appetite - - - - 0 - -  Feeling bad or failure about yourself  - - - - 0 - -  Trouble concentrating - - - - 0 - -  Moving slowly or fidgety/restless - - - - 0 - -  Suicidal thoughts - - - - 0 - -  PHQ-9 Score - - - - 0 - -  Difficult doing work/chores - - - - Not difficult at all - -   Review of Systems  Constitutional: Negative.   HENT: Negative.   Eyes: Negative.   Respiratory:       Copd  Cardiovascular:       Enlarge aorta    Gastrointestinal: Positive for constipation.  Endocrine: Negative.   Genitourinary: Positive for urgency.  Musculoskeletal: Positive for gait problem and joint swelling.       Spasms  Allergic/Immunologic: Negative.   Neurological: Negative for syncope.  Hematological: Bruises/bleeds easily.    Psychiatric/Behavioral:       Anxiety, depression       Objective:   Physical Exam Vitals and nursing note reviewed.  Constitutional:      Appearance: Normal appearance.  HENT:     Head:     Comments: Left Scalp Laceration with Dry Blood Noted Cardiovascular:     Rate and Rhythm: Normal rate and regular rhythm.  Pulses: Normal pulses.     Heart sounds: Normal heart sounds.  Pulmonary:     Effort: Pulmonary effort is normal.     Breath sounds: Normal breath sounds.  Musculoskeletal:     Cervical back: Normal range of motion and neck supple.     Comments: Normal Muscle Bulk and Muscle Testing Reveals:  Upper Extremities: Right: Full ROM and Muscle Strength 5/5 Left Upper Extremity: Left Upper Extremity with Decreased ROM 30 Degrees and Muscle Strength 2/5 Soft cast Noted in Sling Lumbar Paraspinal Tenderness: L-4-L-5 Lower Extremities: Full ROM and Muscle Strength 5/5 Arrived in wheelchair   Neurological:     Mental Status: She is alert.           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 2. OA of Left Knee: Refilled Oxycodone 10/325 mg one tablet every 8 hours as needed #90.11/02/2019 We will continue the opioid monitoring program, this consists of regular clinic visits, examinations, urine drug screen, pill counts as well as use of [place] Controlled Substance reporting System. Continue Voltaren Gel and HEP as tolerated. 3. Muscle Spasms: Continuecurrent medication regimen withFlexeril as needed.11/02/2019 4. Left Greater Trochanter Bursitis: Continue to Alternate with Heat and Ice Therapy. Continue to Monitor.11/02/2019  F/U in 1 month  20 minutes of face to face patient care time was spent during this visit. All questions were encouraged and answered.

## 2019-11-03 DIAGNOSIS — S52572D Other intraarticular fracture of lower end of left radius, subsequent encounter for closed fracture with routine healing: Secondary | ICD-10-CM | POA: Diagnosis not present

## 2019-11-10 ENCOUNTER — Encounter: Payer: Medicare HMO | Admitting: Surgery

## 2019-11-17 ENCOUNTER — Ambulatory Visit: Payer: Medicare HMO | Admitting: Cardiology

## 2019-11-19 DIAGNOSIS — I350 Nonrheumatic aortic (valve) stenosis: Secondary | ICD-10-CM | POA: Diagnosis not present

## 2019-11-19 DIAGNOSIS — S62102A Fracture of unspecified carpal bone, left wrist, initial encounter for closed fracture: Secondary | ICD-10-CM | POA: Diagnosis not present

## 2019-11-19 DIAGNOSIS — S0990XD Unspecified injury of head, subsequent encounter: Secondary | ICD-10-CM | POA: Diagnosis not present

## 2019-11-19 DIAGNOSIS — S0285XA Fracture of orbit, unspecified, initial encounter for closed fracture: Secondary | ICD-10-CM | POA: Diagnosis not present

## 2019-11-22 ENCOUNTER — Other Ambulatory Visit: Payer: Self-pay | Admitting: Registered Nurse

## 2019-11-29 DIAGNOSIS — R300 Dysuria: Secondary | ICD-10-CM | POA: Diagnosis not present

## 2019-11-30 ENCOUNTER — Encounter: Payer: Medicare HMO | Attending: Physical Medicine & Rehabilitation | Admitting: Registered Nurse

## 2019-11-30 ENCOUNTER — Encounter: Payer: Self-pay | Admitting: Registered Nurse

## 2019-11-30 ENCOUNTER — Other Ambulatory Visit: Payer: Self-pay

## 2019-11-30 VITALS — Ht 66.0 in | Wt 130.0 lb

## 2019-11-30 DIAGNOSIS — M7062 Trochanteric bursitis, left hip: Secondary | ICD-10-CM | POA: Insufficient documentation

## 2019-11-30 DIAGNOSIS — F17210 Nicotine dependence, cigarettes, uncomplicated: Secondary | ICD-10-CM | POA: Insufficient documentation

## 2019-11-30 DIAGNOSIS — G894 Chronic pain syndrome: Secondary | ICD-10-CM | POA: Diagnosis not present

## 2019-11-30 DIAGNOSIS — J449 Chronic obstructive pulmonary disease, unspecified: Secondary | ICD-10-CM | POA: Insufficient documentation

## 2019-11-30 DIAGNOSIS — R000 Tachycardia, unspecified: Secondary | ICD-10-CM | POA: Insufficient documentation

## 2019-11-30 DIAGNOSIS — M545 Low back pain: Secondary | ICD-10-CM | POA: Insufficient documentation

## 2019-11-30 DIAGNOSIS — F419 Anxiety disorder, unspecified: Secondary | ICD-10-CM | POA: Insufficient documentation

## 2019-11-30 DIAGNOSIS — M5136 Other intervertebral disc degeneration, lumbar region: Secondary | ICD-10-CM

## 2019-11-30 DIAGNOSIS — I5032 Chronic diastolic (congestive) heart failure: Secondary | ICD-10-CM | POA: Insufficient documentation

## 2019-11-30 DIAGNOSIS — F329 Major depressive disorder, single episode, unspecified: Secondary | ICD-10-CM | POA: Insufficient documentation

## 2019-11-30 DIAGNOSIS — Z79891 Long term (current) use of opiate analgesic: Secondary | ICD-10-CM | POA: Insufficient documentation

## 2019-11-30 DIAGNOSIS — M1712 Unilateral primary osteoarthritis, left knee: Secondary | ICD-10-CM | POA: Insufficient documentation

## 2019-11-30 DIAGNOSIS — Z5181 Encounter for therapeutic drug level monitoring: Secondary | ICD-10-CM | POA: Diagnosis not present

## 2019-11-30 DIAGNOSIS — G8929 Other chronic pain: Secondary | ICD-10-CM | POA: Insufficient documentation

## 2019-11-30 DIAGNOSIS — E039 Hypothyroidism, unspecified: Secondary | ICD-10-CM | POA: Insufficient documentation

## 2019-11-30 MED ORDER — OXYCODONE-ACETAMINOPHEN 10-325 MG PO TABS: 1.0000 | ORAL_TABLET | Freq: Three times a day (TID) | ORAL | 0 refills | Status: DC | PRN

## 2019-11-30 NOTE — Progress Notes (Signed)
Subjective:    Patient ID: Ashley Houston, female    DOB: [birth year], 79 y.o.   MRN: [id number]  HPI: Ashley Houston is a 79 y.o. female who asked for a tele-health visit due to not feeling well with recent diagnosis of UTI, she reports. [name] agrees with tele-health visit and verbalizes understanding. She states her pain is located in her lower back, left hip and left knee. She rates her pain 8. Her current exercise regime is walking and performing stretching exercises.  [name] Morphine equivalent is 45.00 MME. She  is also prescribed Lorazepam by [name]. .We have discussed the black box warning of using opioids and benzodiazepines. I highlighted the dangers of using these drugs together and discussed the adverse events including respiratory suppression, overdose, cognitive impairment and importance of compliance with current regimen. We will continue to monitor and adjust as indicated.   Last Oral Swab was Performed on 09/28/2019, it was consistent.   Pain Inventory Average Pain 9 Pain Right Now 8 My pain is intermittent, constant, dull and aching  In the last 24 hours, has pain interfered with the following? General activity varies Relation with others varies Enjoyment of life varies What TIME of day is your pain at its worst? morning Sleep (in general) Good  Pain is worse with: bending and sitting Pain improves with: heat/ice, pacing activities and medication Relief from Meds: 9  Mobility walk without assistance walk with assistance use a walker  Function retired  Neuro/Psych bowel control problems trouble walking spasms depression anxiety  Prior Studies Any changes since last visit?  no  Physicians involved in your care Any changes since last visit?  no   No family history on file. Social History   Socioeconomic History  . Marital status: Widowed    Spouse name: Not on file  . Number of children: Not on file  . Years of  education: Not on file  . Highest education level: Not on file  Occupational History  . Not on file  Tobacco Use  . Smoking status: Current Every Day Smoker    Packs/day: 0.50    Types: Cigarettes  . Smokeless tobacco: Never Used  Substance and Sexual Activity  . Alcohol use: No  . Drug use: No  . Sexual activity: Not on file  Other Topics Concern  . Not on file  Social History Narrative  . Not on file   Social Determinants of Health   Financial Resource Strain:   . Difficulty of Paying Living Expenses:   Food Insecurity:   . Worried About Programme researcher, broadcasting/film/video in the Last Year:   . Barista in the Last Year:   Transportation Needs:   . Freight forwarder (Medical):   Marland Kitchen Lack of Transportation (Non-Medical):   Physical Activity:   . Days of Exercise per Week:   . Minutes of Exercise per Session:   Stress:   . Feeling of Stress :   Social Connections:   . Frequency of Communication with Friends and Family:   . Frequency of Social Gatherings with Friends and Family:   . Attends Religious Services:   . Active Member of Clubs or Organizations:   . Attends Banker Meetings:   Marland Kitchen Marital Status:    Past Surgical History:  Procedure Laterality Date  . ABDOMINAL HYSTERECTOMY    . CHOLECYSTECTOMY    . TONSILLECTOMY     Past Medical History:  Diagnosis Date  . Anxiety   .  Chronic diastolic heart failure (HCC)   . Chronic pain    hips back and shoulders  . COPD (chronic obstructive pulmonary disease) (HCC)   . Depression   . Hypothyroidism 09/08/2015  . Tachycardia   . Thyroid disease    There were no vitals taken for this visit.  Opioid Risk Score:   Fall Risk Score:  `1  Depression screen PHQ 2/9  Depression screen [facility] 2/9 11/02/2019 04/01/2018 03/01/2018 02/01/2018 01/08/2018 08/26/2017 07/31/2017  Decreased Interest 0 0 0 0 0 0 1  Down, Depressed, Hopeless 0 0 0 0 0 0 1  PHQ - 2 Score 0 0 0 0 0 0 2  Altered sleeping 0 - - - - 0 -  Tired,  decreased energy 0 - - - - 0 -  Change in appetite 0 - - - - 0 -  Feeling bad or failure about yourself  1 - - - - 0 -  Trouble concentrating 0 - - - - 0 -  Moving slowly or fidgety/restless 0 - - - - 0 -  Suicidal thoughts 0 - - - - 0 -  PHQ-9 Score 1 - - - - 0 -  Difficult doing work/chores - - - - - Not difficult at all -    Review of Systems  Constitutional: Negative.   HENT: Negative.   Eyes: Negative.   Respiratory: Positive for shortness of breath.   Cardiovascular: Negative.   Gastrointestinal: Positive for constipation.  Endocrine: Negative.   Genitourinary: Negative.   Musculoskeletal: Positive for arthralgias, back pain and gait problem.       Spasms  Skin: Negative.   Allergic/Immunologic: Negative.   Hematological: Negative.   Psychiatric/Behavioral: Positive for dysphoric mood. The patient is nervous/anxious.   All other systems reviewed and are negative.      Objective:   Physical Exam Vitals and nursing note reviewed.  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 2. OA of Left Knee: Refilled Oxycodone 10/325 mg one tablet every 8 hours as needed #90.11/30/2019 We will continue the opioid monitoring program, this consists of regular clinic visits, examinations, urine drug screen, pill counts as well as use of [place] Controlled Substance reporting System. Continue Voltaren Gel and HEP as tolerated. 3. Muscle Spasms: Continuecurrent medication regimen withFlexeril as needed.11/30/2019 4. Left Greater Trochanter Bursitis: Continue to Alternate with Heat and Ice Therapy. Continue to Monitor.11/30/2019  F/U in 1 month  Tele-Health Visit: Telephone Call  Established Patient Location of Patient: In Her Home Location of Provider: In  the Office Total Time Spent: 10 Minutes

## 2019-12-13 DIAGNOSIS — S52572D Other intraarticular fracture of lower end of left radius, subsequent encounter for closed fracture with routine healing: Secondary | ICD-10-CM | POA: Diagnosis not present

## 2019-12-26 ENCOUNTER — Other Ambulatory Visit (HOSPITAL_COMMUNITY)
Admission: RE | Admit: 2019-12-26 | Discharge: 2019-12-26 | Disposition: A | Discharge status: Home / Self Care | Payer: Medicare HMO | Source: Ambulatory Visit | Attending: Cardiovascular Disease | Admitting: Cardiovascular Disease

## 2019-12-26 DIAGNOSIS — F411 Generalized anxiety disorder: Secondary | ICD-10-CM | POA: Diagnosis not present

## 2019-12-26 DIAGNOSIS — M545 Low back pain: Secondary | ICD-10-CM | POA: Diagnosis not present

## 2019-12-26 DIAGNOSIS — Z6821 Body mass index (BMI) 21.0-21.9, adult: Secondary | ICD-10-CM | POA: Diagnosis not present

## 2019-12-26 DIAGNOSIS — I10 Essential (primary) hypertension: Secondary | ICD-10-CM | POA: Diagnosis not present

## 2019-12-26 DIAGNOSIS — Z0001 Encounter for general adult medical examination with abnormal findings: Secondary | ICD-10-CM | POA: Diagnosis not present

## 2019-12-26 DIAGNOSIS — E782 Mixed hyperlipidemia: Secondary | ICD-10-CM | POA: Diagnosis not present

## 2019-12-26 DIAGNOSIS — I350 Nonrheumatic aortic (valve) stenosis: Secondary | ICD-10-CM | POA: Diagnosis not present

## 2019-12-26 DIAGNOSIS — J449 Chronic obstructive pulmonary disease, unspecified: Secondary | ICD-10-CM | POA: Diagnosis not present

## 2019-12-27 ENCOUNTER — Ambulatory Visit (HOSPITAL_COMMUNITY): Admission: RE | Admit: 2019-12-27 | Payer: Medicare HMO | Source: Ambulatory Visit

## 2020-01-04 ENCOUNTER — Other Ambulatory Visit: Payer: Self-pay

## 2020-01-04 ENCOUNTER — Encounter: Payer: Medicare HMO | Attending: Physical Medicine & Rehabilitation | Admitting: Registered Nurse

## 2020-01-04 ENCOUNTER — Encounter: Payer: Self-pay | Admitting: Registered Nurse

## 2020-01-04 VITALS — BP 117/69 | HR 78 | Temp 98.0°F | Ht 66.0 in | Wt 133.2 lb

## 2020-01-04 DIAGNOSIS — F17210 Nicotine dependence, cigarettes, uncomplicated: Secondary | ICD-10-CM | POA: Diagnosis not present

## 2020-01-04 DIAGNOSIS — Z5181 Encounter for therapeutic drug level monitoring: Secondary | ICD-10-CM | POA: Diagnosis not present

## 2020-01-04 DIAGNOSIS — M1712 Unilateral primary osteoarthritis, left knee: Secondary | ICD-10-CM | POA: Diagnosis not present

## 2020-01-04 DIAGNOSIS — R000 Tachycardia, unspecified: Secondary | ICD-10-CM | POA: Diagnosis not present

## 2020-01-04 DIAGNOSIS — M5416 Radiculopathy, lumbar region: Secondary | ICD-10-CM | POA: Diagnosis not present

## 2020-01-04 DIAGNOSIS — I5032 Chronic diastolic (congestive) heart failure: Secondary | ICD-10-CM | POA: Diagnosis not present

## 2020-01-04 DIAGNOSIS — E039 Hypothyroidism, unspecified: Secondary | ICD-10-CM | POA: Diagnosis not present

## 2020-01-04 DIAGNOSIS — F419 Anxiety disorder, unspecified: Secondary | ICD-10-CM | POA: Insufficient documentation

## 2020-01-04 DIAGNOSIS — G894 Chronic pain syndrome: Secondary | ICD-10-CM | POA: Diagnosis not present

## 2020-01-04 DIAGNOSIS — M545 Low back pain: Secondary | ICD-10-CM | POA: Diagnosis not present

## 2020-01-04 DIAGNOSIS — F329 Major depressive disorder, single episode, unspecified: Secondary | ICD-10-CM | POA: Diagnosis not present

## 2020-01-04 DIAGNOSIS — M7062 Trochanteric bursitis, left hip: Secondary | ICD-10-CM | POA: Diagnosis not present

## 2020-01-04 DIAGNOSIS — Z79891 Long term (current) use of opiate analgesic: Secondary | ICD-10-CM | POA: Diagnosis not present

## 2020-01-04 DIAGNOSIS — J449 Chronic obstructive pulmonary disease, unspecified: Secondary | ICD-10-CM | POA: Diagnosis not present

## 2020-01-04 DIAGNOSIS — M5136 Other intervertebral disc degeneration, lumbar region: Secondary | ICD-10-CM | POA: Diagnosis not present

## 2020-01-04 DIAGNOSIS — G8929 Other chronic pain: Secondary | ICD-10-CM | POA: Insufficient documentation

## 2020-01-04 NOTE — Progress Notes (Addendum)
Subjective:    Patient ID: Ashley Houston, female    DOB: 12-19-1940, 79 y.o.   MRN: [id number]  HPI: Ashley Houston is a 79 y.o. female who returns for follow up appointment for chronic pain and medication refill. She states her pain is located in her lower back radiating into her left hip and left lower extremity. Also reports left knee pain. She rates her pain 2. Her current exercise regime is walking.  [name] Morphine equivalent is 45.00  MME. She is also prescribed Lorazepam by [name]. We have discussed the black box warning of using opioids and benzodiazepines. I highlighted the dangers of using these drugs together and discussed the adverse events including respiratory suppression, overdose, cognitive impairment and importance of compliance with current regimen. We will continue to monitor and adjust as indicated.   Oral Swab was Performed Today.   Pain Inventory Average Pain 8 Pain Right Now 2 My pain is dull and aching  In the last 24 hours, has pain interfered with the following? General activity 2 Relation with others 3 Enjoyment of life 3 What TIME of day is your pain at its worst? morning , daytime and evening Sleep (in general) Good  Pain is worse with: walking, bending, sitting, standing and some activites Pain improves with: rest, heat/ice and medication Relief from Meds: 10  No family history on file. Social History   Socioeconomic History  . Marital status: Widowed    Spouse name: Not on file  . Number of children: Not on file  . Years of education: Not on file  . Highest education level: Not on file  Occupational History  . Not on file  Tobacco Use  . Smoking status: Current Every Day Smoker    Packs/day: 0.50    Types: Cigarettes  . Smokeless tobacco: Never Used  Substance and Sexual Activity  . Alcohol use: No  . Drug use: No  . Sexual activity: Not on file  Other Topics Concern  . Not on file  Social History Narrative  . Not  on file   Social Determinants of Health   Financial Resource Strain:   . Difficulty of Paying Living Expenses: Not on file  Food Insecurity:   . Worried About Programme researcher, broadcasting/film/video in the Last Year: Not on file  . Ran Out of Food in the Last Year: Not on file  Transportation Needs:   . Lack of Transportation (Medical): Not on file  . Lack of Transportation (Non-Medical): Not on file  Physical Activity:   . Days of Exercise per Week: Not on file  . Minutes of Exercise per Session: Not on file  Stress:   . Feeling of Stress : Not on file  Social Connections:   . Frequency of Communication with Friends and Family: Not on file  . Frequency of Social Gatherings with Friends and Family: Not on file  . Attends Religious Services: Not on file  . Active Member of Clubs or Organizations: Not on file  . Attends Banker Meetings: Not on file  . Marital Status: Not on file   Past Surgical History:  Procedure Laterality Date  . ABDOMINAL HYSTERECTOMY    . CHOLECYSTECTOMY    . TONSILLECTOMY     Past Surgical History:  Procedure Laterality Date  . ABDOMINAL HYSTERECTOMY    . CHOLECYSTECTOMY    . TONSILLECTOMY     Past Medical History:  Diagnosis Date  . Anxiety   . Chronic diastolic  heart failure (HCC)   . Chronic pain    hips back and shoulders  . COPD (chronic obstructive pulmonary disease) (HCC)   . Depression   . Hypothyroidism 09/08/2015  . Tachycardia   . Thyroid disease    BP 117/69   Pulse 78   Temp 98 F (36.7 C)   Ht 5\' 6"  (1.676 m)   Wt 133 lb 3.2 oz (60.4 kg)   SpO2 92%   BMI 21.50 kg/m   Opioid Risk Score:   Fall Risk Score:  `1  Depression screen PHQ 2/9  Depression screen [facility] 2/9 11/02/2019 04/01/2018 03/01/2018 02/01/2018 01/08/2018 08/26/2017 07/31/2017  Decreased Interest 0 0 0 0 0 0 1  Down, Depressed, Hopeless 0 0 0 0 0 0 1  PHQ - 2 Score 0 0 0 0 0 0 2  Altered sleeping 0 - - - - 0 -  Tired, decreased energy 0 - - - - 0 -  Change in  appetite 0 - - - - 0 -  Feeling bad or failure about yourself  1 - - - - 0 -  Trouble concentrating 0 - - - - 0 -  Moving slowly or fidgety/restless 0 - - - - 0 -  Suicidal thoughts 0 - - - - 0 -  PHQ-9 Score 1 - - - - 0 -  Difficult doing work/chores - - - - - Not difficult at all -    Review of Systems  Gastrointestinal: Positive for constipation.  Musculoskeletal: Positive for back pain and gait problem.       Hip and leg pain Spasms   All other systems reviewed and are negative.      Objective:   Physical Exam Vitals and nursing note reviewed.  Constitutional:      Appearance: Normal appearance.  Cardiovascular:     Rate and Rhythm: Normal rate and regular rhythm.     Pulses: Normal pulses.     Heart sounds: Normal heart sounds.  Pulmonary:     Effort: Pulmonary effort is normal.     Breath sounds: Normal breath sounds.  Musculoskeletal:     Cervical back: Normal range of motion and neck supple.     Comments: Normal Muscle Bulk and Muscle Testing Reveals:  Upper Extremities:Full  ROM and Muscle Strength on Right 5/5 and Left 4/5 Wearing Left wrist Splint  Lumbar Paraspinal Tenderness: L-4-L-5 Lower Extremities: Full ROM and Muscle Strength 5/5 Arises from Table with ease Narrow Based Gait   Skin:    General: Skin is warm and dry.  Neurological:     Mental Status: She is alert and oriented to person, place, and time.  Psychiatric:        Mood and Affect: Mood normal.        Behavior: Behavior normal.           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 2. OA of Left Knee: Refilled: Oxycodone 10/325 mg one tablet every 8 hours as needed #90.01/04/2020 We will continue the opioid monitoring program, this consists of regular clinic visits, examinations, urine drug screen, pill counts as well as use of 01/06/2020 Controlled Substance  Reporting system. A 12 month History has been reviewed on the [place] Controlled Substance Reporting System on 01/04/2020.  3. Muscle Spasms: Continuecurrent medication regimen withFlexeril as needed.01/04/2020 4. Left Greater Trochanter Bursitis: Continue to Alternate with Heat and  Ice Therapy. Continue to Monitor.01/04/2020  F/U in 1 month  20 minutes of face to face patient care time was spent during this visit. All questions were encouraged and answered.

## 2020-01-07 LAB — DRUG TOX MONITOR 1 W/CONF, ORAL FLD
Alprazolam: NEGATIVE ng/mL (ref <0.50)
Amphetamines: NEGATIVE ng/mL (ref <10)
Barbiturates: NEGATIVE ng/mL (ref <10)
Benzodiazepines: POSITIVE ng/mL — AB (ref <0.50)
Buprenorphine: NEGATIVE ng/mL (ref <0.10)
Chlordiazepoxide: NEGATIVE ng/mL (ref <0.50)
Clonazepam: NEGATIVE ng/mL (ref <0.50)
Cocaine: NEGATIVE ng/mL (ref <5.0)
Codeine: NEGATIVE ng/mL (ref <2.5)
Cotinine: 48.2 ng/mL — ABNORMAL HIGH (ref <5.0)
Diazepam: NEGATIVE ng/mL (ref <0.50)
Dihydrocodeine: NEGATIVE ng/mL (ref <2.5)
Fentanyl: NEGATIVE ng/mL (ref <0.10)
Flunitrazepam: NEGATIVE ng/mL (ref <0.50)
Flurazepam: NEGATIVE ng/mL (ref <0.50)
Heroin Metabolite: NEGATIVE ng/mL (ref <1.0)
Hydrocodone: NEGATIVE ng/mL (ref <2.5)
Hydromorphone: NEGATIVE ng/mL (ref <2.5)
Lorazepam: 1.27 ng/mL — ABNORMAL HIGH (ref <0.50)
MARIJUANA: NEGATIVE ng/mL (ref <2.5)
MDMA: NEGATIVE ng/mL (ref <10)
Meprobamate: NEGATIVE ng/mL (ref <2.5)
Methadone: NEGATIVE ng/mL (ref <5.0)
Midazolam: NEGATIVE ng/mL (ref <0.50)
Morphine: NEGATIVE ng/mL (ref <2.5)
Nicotine Metabolite: POSITIVE ng/mL — AB (ref <5.0)
Nordiazepam: NEGATIVE ng/mL (ref <0.50)
Norhydrocodone: NEGATIVE ng/mL (ref <2.5)
Noroxycodone: 45.5 ng/mL — ABNORMAL HIGH (ref <2.5)
Opiates: POSITIVE ng/mL — AB (ref <2.5)
Oxazepam: NEGATIVE ng/mL (ref <0.50)
Oxycodone: 214.6 ng/mL — ABNORMAL HIGH (ref <2.5)
Oxymorphone: NEGATIVE ng/mL (ref <2.5)
Phencyclidine: NEGATIVE ng/mL (ref <10)
Tapentadol: NEGATIVE ng/mL (ref <5.0)
Temazepam: NEGATIVE ng/mL (ref <0.50)
Tramadol: NEGATIVE ng/mL (ref <5.0)
Triazolam: NEGATIVE ng/mL (ref <0.50)
Zolpidem: NEGATIVE ng/mL (ref <5.0)

## 2020-01-07 LAB — DRUG TOX ALC METAB W/CON, ORAL FLD: Alcohol Metabolite: NEGATIVE ng/mL (ref <25)

## 2020-01-10 DIAGNOSIS — E7849 Other hyperlipidemia: Secondary | ICD-10-CM | POA: Diagnosis not present

## 2020-01-10 DIAGNOSIS — I10 Essential (primary) hypertension: Secondary | ICD-10-CM | POA: Diagnosis not present

## 2020-01-10 DIAGNOSIS — Z720 Tobacco use: Secondary | ICD-10-CM | POA: Diagnosis not present

## 2020-01-10 DIAGNOSIS — J441 Chronic obstructive pulmonary disease with (acute) exacerbation: Secondary | ICD-10-CM | POA: Diagnosis not present

## 2020-01-12 ENCOUNTER — Telehealth: Payer: Self-pay | Admitting: *Deleted

## 2020-01-12 NOTE — Telephone Encounter (Signed)
Oral swab drug screen was consistent for prescribed medications.  ?

## 2020-01-13 DIAGNOSIS — I10 Essential (primary) hypertension: Secondary | ICD-10-CM | POA: Diagnosis not present

## 2020-01-13 DIAGNOSIS — J449 Chronic obstructive pulmonary disease, unspecified: Secondary | ICD-10-CM | POA: Diagnosis not present

## 2020-01-13 DIAGNOSIS — R011 Cardiac murmur, unspecified: Secondary | ICD-10-CM | POA: Diagnosis not present

## 2020-01-13 DIAGNOSIS — Z6821 Body mass index (BMI) 21.0-21.9, adult: Secondary | ICD-10-CM | POA: Diagnosis not present

## 2020-01-13 DIAGNOSIS — F411 Generalized anxiety disorder: Secondary | ICD-10-CM | POA: Diagnosis not present

## 2020-01-13 DIAGNOSIS — E782 Mixed hyperlipidemia: Secondary | ICD-10-CM | POA: Diagnosis not present

## 2020-01-13 DIAGNOSIS — M25562 Pain in left knee: Secondary | ICD-10-CM | POA: Diagnosis not present

## 2020-01-13 DIAGNOSIS — L723 Sebaceous cyst: Secondary | ICD-10-CM | POA: Diagnosis not present

## 2020-01-17 DIAGNOSIS — S52572D Other intraarticular fracture of lower end of left radius, subsequent encounter for closed fracture with routine healing: Secondary | ICD-10-CM | POA: Diagnosis not present

## 2020-01-20 DIAGNOSIS — L723 Sebaceous cyst: Secondary | ICD-10-CM | POA: Diagnosis not present

## 2020-01-25 ENCOUNTER — Encounter: Payer: Self-pay | Admitting: Registered Nurse

## 2020-01-25 ENCOUNTER — Encounter: Payer: Medicare HMO | Attending: Physical Medicine & Rehabilitation | Admitting: Registered Nurse

## 2020-01-25 ENCOUNTER — Other Ambulatory Visit: Payer: Self-pay

## 2020-01-25 VITALS — BP 115/72 | HR 73 | Temp 98.7°F | Ht 66.0 in | Wt 132.4 lb

## 2020-01-25 DIAGNOSIS — G8929 Other chronic pain: Secondary | ICD-10-CM | POA: Insufficient documentation

## 2020-01-25 DIAGNOSIS — M545 Low back pain: Secondary | ICD-10-CM | POA: Insufficient documentation

## 2020-01-25 DIAGNOSIS — I5032 Chronic diastolic (congestive) heart failure: Secondary | ICD-10-CM | POA: Diagnosis not present

## 2020-01-25 DIAGNOSIS — M5136 Other intervertebral disc degeneration, lumbar region: Secondary | ICD-10-CM

## 2020-01-25 DIAGNOSIS — M1712 Unilateral primary osteoarthritis, left knee: Secondary | ICD-10-CM | POA: Diagnosis not present

## 2020-01-25 DIAGNOSIS — J449 Chronic obstructive pulmonary disease, unspecified: Secondary | ICD-10-CM | POA: Diagnosis not present

## 2020-01-25 DIAGNOSIS — M7062 Trochanteric bursitis, left hip: Secondary | ICD-10-CM

## 2020-01-25 DIAGNOSIS — F17210 Nicotine dependence, cigarettes, uncomplicated: Secondary | ICD-10-CM | POA: Diagnosis not present

## 2020-01-25 DIAGNOSIS — M5416 Radiculopathy, lumbar region: Secondary | ICD-10-CM | POA: Diagnosis not present

## 2020-01-25 DIAGNOSIS — E039 Hypothyroidism, unspecified: Secondary | ICD-10-CM | POA: Insufficient documentation

## 2020-01-25 DIAGNOSIS — Z79891 Long term (current) use of opiate analgesic: Secondary | ICD-10-CM

## 2020-01-25 DIAGNOSIS — Z5181 Encounter for therapeutic drug level monitoring: Secondary | ICD-10-CM

## 2020-01-25 DIAGNOSIS — G894 Chronic pain syndrome: Secondary | ICD-10-CM | POA: Diagnosis not present

## 2020-01-25 DIAGNOSIS — R000 Tachycardia, unspecified: Secondary | ICD-10-CM | POA: Diagnosis not present

## 2020-01-25 DIAGNOSIS — M51369 Other intervertebral disc degeneration, lumbar region without mention of lumbar back pain or lower extremity pain: Secondary | ICD-10-CM

## 2020-01-25 DIAGNOSIS — F419 Anxiety disorder, unspecified: Secondary | ICD-10-CM | POA: Insufficient documentation

## 2020-01-25 DIAGNOSIS — F329 Major depressive disorder, single episode, unspecified: Secondary | ICD-10-CM | POA: Diagnosis not present

## 2020-01-25 MED ORDER — OXYCODONE-ACETAMINOPHEN 10-325 MG PO TABS: 1.0000 | ORAL_TABLET | Freq: Three times a day (TID) | ORAL | 0 refills | Status: DC | PRN

## 2020-01-25 MED ORDER — OXYCODONE-ACETAMINOPHEN 10-325 MG PO TABS
1.0000 | ORAL_TABLET | Freq: Three times a day (TID) | ORAL | 0 refills | Status: DC | PRN
Start: 2020-01-25 — End: 2020-03-28

## 2020-01-25 NOTE — Progress Notes (Signed)
Subjective:    Patient ID: Ashley Houston, female    DOB: 08/15/1940, 79 y.o.   MRN: [id number]  HPI: Ashley Houston is a 79 y.o. female who returns for follow up appointment for chronic pain and medication refill. She states her pain is located in her lower back pain radiating into her left hip and left lower extremity. Also reports left knee pain. She rates her pain 4. Her current exercise regime is walking.   Ms. Gipson Morphine equivalent is 45.00 MME. She is also prescribed Lorazepam by [name]. We have discussed the black box warning of using opioids and benzodiazepines. I highlighted the dangers of using these drugs together and discussed the adverse events including respiratory suppression, overdose, cognitive impairment and importance of compliance with current regimen. We will continue to monitor and adjust as indicated.     Last Oral Swab was Performed on 01/04/2020, it was consistent.   Pain Inventory Average Pain 8 Pain Right Now 4 My pain is dull and aching  In the last 24 hours, has pain interfered with the following? General activity 4 Relation with others 6 Enjoyment of life 5 What TIME of day is your pain at its worst? morning , daytime and evening Sleep (in general) Good  Pain is worse with: walking, bending, sitting and standing Pain improves with: rest, heat/ice, pacing activities and medication Relief from Meds: 8  No family history on file. Social History   Socioeconomic History  . Marital status: Widowed    Spouse name: Not on file  . Number of children: Not on file  . Years of education: Not on file  . Highest education level: Not on file  Occupational History  . Not on file  Tobacco Use  . Smoking status: Current Every Day Smoker    Packs/day: 0.50    Types: Cigarettes  . Smokeless tobacco: Never Used  Substance and Sexual Activity  . Alcohol use: No  . Drug use: No  . Sexual activity: Not on file  Other Topics Concern  . Not  on file  Social History Narrative  . Not on file   Social Determinants of Health   Financial Resource Strain:   . Difficulty of Paying Living Expenses: Not on file  Food Insecurity:   . Worried About Programme researcher, broadcasting/film/video in the Last Year: Not on file  . Ran Out of Food in the Last Year: Not on file  Transportation Needs:   . Lack of Transportation (Medical): Not on file  . Lack of Transportation (Non-Medical): Not on file  Physical Activity:   . Days of Exercise per Week: Not on file  . Minutes of Exercise per Session: Not on file  Stress:   . Feeling of Stress : Not on file  Social Connections:   . Frequency of Communication with Friends and Family: Not on file  . Frequency of Social Gatherings with Friends and Family: Not on file  . Attends Religious Services: Not on file  . Active Member of Clubs or Organizations: Not on file  . Attends Banker Meetings: Not on file  . Marital Status: Not on file   Past Surgical History:  Procedure Laterality Date  . ABDOMINAL HYSTERECTOMY    . CHOLECYSTECTOMY    . TONSILLECTOMY     Past Surgical History:  Procedure Laterality Date  . ABDOMINAL HYSTERECTOMY    . CHOLECYSTECTOMY    . TONSILLECTOMY     Past Medical History:  Diagnosis Date  .  Anxiety   . Chronic diastolic heart failure (HCC)   . Chronic pain    hips back and shoulders  . COPD (chronic obstructive pulmonary disease) (HCC)   . Depression   . Hypothyroidism 09/08/2015  . Tachycardia   . Thyroid disease    BP 115/72   Pulse 73   Temp 98.7 F (37.1 C)   Ht 5\' 6"  (1.676 m)   Wt 132 lb 6.4 oz (60.1 kg)   SpO2 92%   BMI 21.37 kg/m   Opioid Risk Score:   Fall Risk Score:  `1  Depression screen PHQ 2/9  Depression screen [facility] 2/9 01/04/2020 11/02/2019 04/01/2018 03/01/2018 02/01/2018 01/08/2018 08/26/2017  Decreased Interest 0 0 0 0 0 0 0  Down, Depressed, Hopeless 0 0 0 0 0 0 0  PHQ - 2 Score 0 0 0 0 0 0 0  Altered sleeping - 0 - - - - 0  Tired,  decreased energy - 0 - - - - 0  Change in appetite - 0 - - - - 0  Feeling bad or failure about yourself  - 1 - - - - 0  Trouble concentrating - 0 - - - - 0  Moving slowly or fidgety/restless - 0 - - - - 0  Suicidal thoughts - 0 - - - - 0  PHQ-9 Score - 1 - - - - 0  Difficult doing work/chores - - - - - - Not difficult at all    Review of Systems  Musculoskeletal: Positive for back pain.       Hip and knee pain  Psychiatric/Behavioral: Negative for dysphoric mood. The patient is nervous/anxious.   All other systems reviewed and are negative.      Objective:   Physical Exam Vitals and nursing note reviewed.  Cardiovascular:     Rate and Rhythm: Normal rate and regular rhythm.     Pulses: Normal pulses.     Heart sounds: Normal heart sounds.  Pulmonary:     Effort: Pulmonary effort is normal.     Breath sounds: Normal breath sounds.  Musculoskeletal:     Right lower leg: Edema present.     Comments: Normal Muscle Bulk and Muscle Testing Reveals:  Upper Extremities: Full ROM and Muscle Strength Right 5/5 and Left 4/5 Lumbar Paraspinal Tenderness: L-4-L-5 Left Greater Trochanteric Tenderness Lower Extremities: Full ROM and Muscle Strength 5/5 Arises from Table with ease Narrow Based Gait   Skin:    General: Skin is warm and dry.  Neurological:     Mental Status: She is oriented to person, place, and time.  Psychiatric:        Mood and Affect: Mood normal.        Behavior: Behavior normal.           Assessment & Plan:  1.Left Lumbar Radiculitis/Chronic low back pain likely related to lumbar spondylosis and facet arthropathy: Continue HEP as Tolerated.01/25/2020. 2. OA of Left Knee: Refilled: Oxycodone 10/325 mg one tablet every 8 hours as needed #90.01/25/2020 We will continue the opioid monitoring program, this consists of regular clinic visits, examinations, urine drug screen, pill counts as well as use of 01/27/2020 Controlled Substance Reporting system. A 12  month History has been reviewed on the [place] Controlled Substance Reporting System on 01/25/2020. 3. Muscle Spasms: Continuecurrent medication regimen withFlexeril as needed.01/25/2020 4. Left Greater Trochanter Bursitis: Continue to Alternate with Heat and Ice Therapy. Continue to Monitor.01/25/2020  F/U in 1 month  20  minutes of face to face patient care time was spent during this visit. All questions were encouraged and answered.

## 2020-02-09 DIAGNOSIS — I10 Essential (primary) hypertension: Secondary | ICD-10-CM | POA: Diagnosis not present

## 2020-02-09 DIAGNOSIS — E7849 Other hyperlipidemia: Secondary | ICD-10-CM | POA: Diagnosis not present

## 2020-02-09 DIAGNOSIS — Z720 Tobacco use: Secondary | ICD-10-CM | POA: Diagnosis not present

## 2020-02-09 DIAGNOSIS — J441 Chronic obstructive pulmonary disease with (acute) exacerbation: Secondary | ICD-10-CM | POA: Diagnosis not present

## 2020-03-05 ENCOUNTER — Telehealth: Payer: Self-pay | Admitting: Registered Nurse

## 2020-03-05 NOTE — Telephone Encounter (Signed)
Call placed to Ashley Houston regarding left hip  injection. She was seen in the office on 01/25/2020, requesting left hip injection, due to increase intensity of pain. She's scheduled to have Left hip injection with Dr Carlis Abbott on 03/07/2020.

## 2020-03-07 ENCOUNTER — Encounter: Payer: Medicare HMO | Attending: Physical Medicine & Rehabilitation | Admitting: Physical Medicine and Rehabilitation

## 2020-03-07 ENCOUNTER — Encounter: Payer: Self-pay | Admitting: Physical Medicine and Rehabilitation

## 2020-03-07 ENCOUNTER — Other Ambulatory Visit: Payer: Self-pay

## 2020-03-07 VITALS — BP 123/74 | HR 68 | Temp 97.9°F | Ht 66.0 in | Wt 133.0 lb

## 2020-03-07 DIAGNOSIS — M7062 Trochanteric bursitis, left hip: Secondary | ICD-10-CM | POA: Diagnosis not present

## 2020-03-07 NOTE — Progress Notes (Signed)
Trochanteric bursa injection, left hip  Indication Trochanteric bursitis. Exam has tenderness over the greater trochanter of the hip. Pain has not responded to conservative care such as exercise therapy and oral medications. Pain interferes with sleep or with mobility Informed consent was obtained after describing risks and benefits of the procedure with the patient these include bleeding bruising and infection. Patient has signed written consent form. Patient placed in a lateral decubitus position with the affected hip superior. Point of maximal pain was palpated marked and prepped with Betadine and entered with a needle to bone contact. Needle slightly withdrawn then 18mL Kenalog with 4 cc 1% lidocaine were injected. Patient tolerated procedure well. Post procedure instructions given.

## 2020-03-10 DIAGNOSIS — E7849 Other hyperlipidemia: Secondary | ICD-10-CM | POA: Diagnosis not present

## 2020-03-10 DIAGNOSIS — I10 Essential (primary) hypertension: Secondary | ICD-10-CM | POA: Diagnosis not present

## 2020-03-10 DIAGNOSIS — Z720 Tobacco use: Secondary | ICD-10-CM | POA: Diagnosis not present

## 2020-03-10 DIAGNOSIS — J441 Chronic obstructive pulmonary disease with (acute) exacerbation: Secondary | ICD-10-CM | POA: Diagnosis not present

## 2020-03-28 ENCOUNTER — Encounter: Payer: Self-pay | Admitting: Registered Nurse

## 2020-03-28 ENCOUNTER — Encounter: Payer: Medicare HMO | Attending: Physical Medicine & Rehabilitation | Admitting: Registered Nurse

## 2020-03-28 ENCOUNTER — Other Ambulatory Visit: Payer: Self-pay

## 2020-03-28 VITALS — BP 115/72 | HR 65 | Temp 97.8°F | Ht 66.0 in | Wt 131.8 lb

## 2020-03-28 DIAGNOSIS — G894 Chronic pain syndrome: Secondary | ICD-10-CM

## 2020-03-28 DIAGNOSIS — Z5181 Encounter for therapeutic drug level monitoring: Secondary | ICD-10-CM | POA: Diagnosis not present

## 2020-03-28 DIAGNOSIS — Z79891 Long term (current) use of opiate analgesic: Secondary | ICD-10-CM

## 2020-03-28 MED ORDER — OXYCODONE-ACETAMINOPHEN 10-325 MG PO TABS
1.0000 | ORAL_TABLET | Freq: Three times a day (TID) | ORAL | 0 refills | Status: DC | PRN
Start: 2020-03-28 — End: 2020-04-25

## 2020-03-28 NOTE — Progress Notes (Signed)
Subjective:    Patient ID: Ashley Houston, female    DOB: March 05, 1941, 79 y.o.   MRN: [id number]  HPI: Ashley Houston is a 79 y.o. female who returns for follow up appointment for chronic pain and medication refill. She states her pain is located in her lower back. She rates her  Pain 8. Her current exercise regime is walking and performing stretching exercises.  Ms. Stoneberg Morphine equivalent is 45.00  MME. She is also prescribed Lorazepam by [name]. We have discussed the black box warning of using opioids and benzodiazepines. I highlighted the dangers of using these drugs together and discussed the adverse events including respiratory suppression, overdose, cognitive impairment and importance of compliance with current regimen. We will continue to monitor and adjust as indicated.   Oral Swab was Performed today.    Pain Inventory Average Pain 8 Pain Right Now 8 My pain is dull and aching  In the last 24 hours, has pain interfered with the following? General activity 0 Relation with others 0 Enjoyment of life 0 What TIME of day is your pain at its worst? morning , daytime and evening Sleep (in general) Good  Pain is worse with: walking, bending, sitting and standing Pain improves with: rest, heat/ice and medication Relief from Meds: 10  No family history on file. Social History   Socioeconomic History  . Marital status: Widowed    Spouse name: Not on file  . Number of children: Not on file  . Years of education: Not on file  . Highest education level: Not on file  Occupational History  . Not on file  Tobacco Use  . Smoking status: Current Every Day Smoker    Packs/day: 0.50    Types: Cigarettes  . Smokeless tobacco: Never Used  Substance and Sexual Activity  . Alcohol use: No  . Drug use: No  . Sexual activity: Not on file  Other Topics Concern  . Not on file  Social History Narrative  . Not on file   Social Determinants of Health   Financial  Resource Strain:   . Difficulty of Paying Living Expenses: Not on file  Food Insecurity:   . Worried About Programme researcher, broadcasting/film/video in the Last Year: Not on file  . Ran Out of Food in the Last Year: Not on file  Transportation Needs:   . Lack of Transportation (Medical): Not on file  . Lack of Transportation (Non-Medical): Not on file  Physical Activity:   . Days of Exercise per Week: Not on file  . Minutes of Exercise per Session: Not on file  Stress:   . Feeling of Stress : Not on file  Social Connections:   . Frequency of Communication with Friends and Family: Not on file  . Frequency of Social Gatherings with Friends and Family: Not on file  . Attends Religious Services: Not on file  . Active Member of Clubs or Organizations: Not on file  . Attends Banker Meetings: Not on file  . Marital Status: Not on file   Past Surgical History:  Procedure Laterality Date  . ABDOMINAL HYSTERECTOMY    . CHOLECYSTECTOMY    . TONSILLECTOMY     Past Surgical History:  Procedure Laterality Date  . ABDOMINAL HYSTERECTOMY    . CHOLECYSTECTOMY    . TONSILLECTOMY     Past Medical History:  Diagnosis Date  . Anxiety   . Chronic diastolic heart failure (HCC)   . Chronic pain  hips back and shoulders  . COPD (chronic obstructive pulmonary disease) (HCC)   . Depression   . Hypothyroidism 09/08/2015  . Tachycardia   . Thyroid disease    BP 115/72   Pulse 65   Temp 97.8 F (36.6 C)   Ht 5\' 6"  (1.676 m)   Wt 131 lb 12.8 oz (59.8 kg)   SpO2 93%   BMI 21.27 kg/m   Opioid Risk Score:   Fall Risk Score:  `1  Depression screen PHQ 2/9  Depression screen [facility] 2/9 01/04/2020 11/02/2019 04/01/2018 03/01/2018 02/01/2018 01/08/2018 08/26/2017  Decreased Interest 0 0 0 0 0 0 0  Down, Depressed, Hopeless 0 0 0 0 0 0 0  PHQ - 2 Score 0 0 0 0 0 0 0  Altered sleeping - 0 - - - - 0  Tired, decreased energy - 0 - - - - 0  Change in appetite - 0 - - - - 0  Feeling bad or failure about  yourself  - 1 - - - - 0  Trouble concentrating - 0 - - - - 0  Moving slowly or fidgety/restless - 0 - - - - 0  Suicidal thoughts - 0 - - - - 0  PHQ-9 Score - 1 - - - - 0  Difficult doing work/chores - - - - - - Not difficult at all   Review of Systems  Musculoskeletal: Positive for back pain.       Hip pain Lt knee pain  All other systems reviewed and are negative.      Objective:   Physical Exam Vitals and nursing note reviewed.  Constitutional:      Appearance: Normal appearance.  Cardiovascular:     Rate and Rhythm: Normal rate and regular rhythm.     Pulses: Normal pulses.     Heart sounds: Normal heart sounds.  Pulmonary:     Effort: Pulmonary effort is normal.     Breath sounds: Normal breath sounds.  Musculoskeletal:     Cervical back: Normal range of motion and neck supple.     Comments: Normal Muscle Bulk and Muscle Testing Reveals:  Upper Extremities: Full ROM and Muscle Strength 5/5 , Lumbar Paraspinal Tenderness: L-4-L-5 Lower Extremities: Full ROM and Muscle Strength 5/5 Arises from Table with ease  Narrow Based  Gait   Skin:    General: Skin is warm and dry.  Neurological:     Mental Status: She is alert and oriented to person, place, and time.  Psychiatric:        Mood and Affect: Mood normal.        Behavior: Behavior normal.           Assessment & Plan:  1.Left Lumbar Radiculitis/Chronic low back pain likely related to lumbar spondylosis and facet arthropathy: Continue HEP as Tolerated.03/28/2020. 2. OA of Left Knee: Refilled:Oxycodone 10/325 mg one tablet every 8 hours as needed #90.03/28/2020 We will continue the opioid monitoring program, this consists of regular clinic visits, examinations, urine drug screen, pill counts as well as use of 03/30/2020 Controlled Substance Reporting system. A 12 month History has been reviewed on the [place] Controlled Substance Reporting System on 011/17/2021. 3. Muscle Spasms: Continuecurrent  medication regimen withFlexeril as needed.03/28/2020 4. Left Greater Trochanter Bursitis: S/P Left Cortisone injection with good relief noted. Continue to Alternate with Heat and Ice Therapy. Continue to Monitor.03/28/2020  F/U in 1 month

## 2020-04-02 LAB — DRUG TOX MONITOR 1 W/CONF, ORAL FLD
Alprazolam: NEGATIVE ng/mL (ref <0.50)
Amphetamines: NEGATIVE ng/mL (ref <10)
Barbiturates: NEGATIVE ng/mL (ref <10)
Benzodiazepines: POSITIVE ng/mL — AB (ref <0.50)
Buprenorphine: NEGATIVE ng/mL (ref <0.10)
Chlordiazepoxide: NEGATIVE ng/mL (ref <0.50)
Clonazepam: NEGATIVE ng/mL (ref <0.50)
Cocaine: NEGATIVE ng/mL (ref <5.0)
Codeine: NEGATIVE ng/mL (ref <2.5)
Cotinine: 37.1 ng/mL — ABNORMAL HIGH (ref <5.0)
Diazepam: NEGATIVE ng/mL (ref <0.50)
Dihydrocodeine: NEGATIVE ng/mL (ref <2.5)
Fentanyl: NEGATIVE ng/mL (ref <0.10)
Flunitrazepam: NEGATIVE ng/mL (ref <0.50)
Flurazepam: NEGATIVE ng/mL (ref <0.50)
Heroin Metabolite: NEGATIVE ng/mL (ref <1.0)
Hydrocodone: NEGATIVE ng/mL (ref <2.5)
Hydromorphone: NEGATIVE ng/mL (ref <2.5)
Lorazepam: 1.19 ng/mL — ABNORMAL HIGH (ref <0.50)
MARIJUANA: NEGATIVE ng/mL (ref <2.5)
MDMA: NEGATIVE ng/mL (ref <10)
Meprobamate: NEGATIVE ng/mL (ref <2.5)
Methadone: NEGATIVE ng/mL (ref <5.0)
Midazolam: NEGATIVE ng/mL (ref <0.50)
Morphine: NEGATIVE ng/mL (ref <2.5)
Nicotine Metabolite: POSITIVE ng/mL — AB (ref <5.0)
Nordiazepam: NEGATIVE ng/mL (ref <0.50)
Norhydrocodone: NEGATIVE ng/mL (ref <2.5)
Noroxycodone: 50.8 ng/mL — ABNORMAL HIGH (ref <2.5)
Opiates: POSITIVE ng/mL — AB (ref <2.5)
Oxazepam: NEGATIVE ng/mL (ref <0.50)
Oxycodone: 234.8 ng/mL — ABNORMAL HIGH (ref <2.5)
Oxymorphone: NEGATIVE ng/mL (ref <2.5)
Phencyclidine: NEGATIVE ng/mL (ref <10)
Tapentadol: NEGATIVE ng/mL (ref <5.0)
Temazepam: NEGATIVE ng/mL (ref <0.50)
Tramadol: NEGATIVE ng/mL (ref <5.0)
Triazolam: NEGATIVE ng/mL (ref <0.50)
Zolpidem: NEGATIVE ng/mL (ref <5.0)

## 2020-04-02 LAB — DRUG TOX ALC METAB W/CON, ORAL FLD: Alcohol Metabolite: NEGATIVE ng/mL (ref <25)

## 2020-04-16 ENCOUNTER — Telehealth: Payer: Self-pay | Admitting: *Deleted

## 2020-04-16 NOTE — Telephone Encounter (Signed)
Oral swab drug screen was consistent for prescribed medications.  ?

## 2020-04-25 ENCOUNTER — Encounter: Payer: Medicare HMO | Attending: Physical Medicine & Rehabilitation | Admitting: Registered Nurse

## 2020-04-25 ENCOUNTER — Encounter: Payer: Self-pay | Admitting: Registered Nurse

## 2020-04-25 ENCOUNTER — Other Ambulatory Visit: Payer: Self-pay

## 2020-04-25 VITALS — BP 105/48 | HR 74 | Temp 98.2°F | Ht 66.0 in | Wt 130.6 lb

## 2020-04-25 DIAGNOSIS — Z5181 Encounter for therapeutic drug level monitoring: Secondary | ICD-10-CM

## 2020-04-25 DIAGNOSIS — Z79891 Long term (current) use of opiate analgesic: Secondary | ICD-10-CM | POA: Diagnosis not present

## 2020-04-25 DIAGNOSIS — M51369 Other intervertebral disc degeneration, lumbar region without mention of lumbar back pain or lower extremity pain: Secondary | ICD-10-CM

## 2020-04-25 DIAGNOSIS — M5136 Other intervertebral disc degeneration, lumbar region: Secondary | ICD-10-CM | POA: Insufficient documentation

## 2020-04-25 DIAGNOSIS — M7062 Trochanteric bursitis, left hip: Secondary | ICD-10-CM | POA: Diagnosis not present

## 2020-04-25 DIAGNOSIS — M5416 Radiculopathy, lumbar region: Secondary | ICD-10-CM | POA: Diagnosis not present

## 2020-04-25 DIAGNOSIS — M1712 Unilateral primary osteoarthritis, left knee: Secondary | ICD-10-CM

## 2020-04-25 DIAGNOSIS — G894 Chronic pain syndrome: Secondary | ICD-10-CM

## 2020-04-25 MED ORDER — OXYCODONE-ACETAMINOPHEN 10-325 MG PO TABS
1.0000 | ORAL_TABLET | Freq: Three times a day (TID) | ORAL | 0 refills | Status: DC | PRN
Start: 2020-04-25 — End: 2020-05-23

## 2020-04-25 NOTE — Progress Notes (Signed)
Subjective:    Patient ID: Ashley Houston, female    DOB: 10/10/1940, 79 y.o.   MRN: [id number]  HPI: Ashley Houston is a 79 y.o. female who returns for follow up appointment for chronic pain and medication refill. She states her pain is located in her lower back radiating into her left hip and left lower extremity. She rates her pain 8. Her current exercise regime is walking and performing stretching exercises.  Ashley Houston Morphine equivalent is 45.00 MME. She is also prescribed Lorazepam by  [name]. We have discussed the black box warning of using opioids and benzodiazepines. I highlighted the dangers of using these drugs together and discussed the adverse events including respiratory suppression, overdose, cognitive impairment and importance of compliance with current regimen. We will continue to monitor and adjust as indicated.    Last Oral Swab was Performed on 03/28/2020, it was consistent.    Pain Inventory Average Pain 8 Pain Right Now 8 My pain is dull and aching  In the last 24 hours, has pain interfered with the following? General activity 0 Relation with others 0 Enjoyment of life 0 What TIME of day is your pain at its worst? morning , daytime and evening Sleep (in general) Good  Pain is worse with: walking, bending, sitting and standing Pain improves with: rest, heat/ice, pacing activities and medication Relief from Meds: 10  No family history on file. Social History   Socioeconomic History  . Marital status: Widowed    Spouse name: Not on file  . Number of children: Not on file  . Years of education: Not on file  . Highest education level: Not on file  Occupational History  . Not on file  Tobacco Use  . Smoking status: Current Every Day Smoker    Packs/day: 0.50    Types: Cigarettes  . Smokeless tobacco: Never Used  Substance and Sexual Activity  . Alcohol use: No  . Drug use: No  . Sexual activity: Not on file  Other Topics Concern  .  Not on file  Social History Narrative  . Not on file   Social Determinants of Health   Financial Resource Strain: Not on file  Food Insecurity: Not on file  Transportation Needs: Not on file  Physical Activity: Not on file  Stress: Not on file  Social Connections: Not on file   Past Surgical History:  Procedure Laterality Date  . ABDOMINAL HYSTERECTOMY    . CHOLECYSTECTOMY    . TONSILLECTOMY     Past Surgical History:  Procedure Laterality Date  . ABDOMINAL HYSTERECTOMY    . CHOLECYSTECTOMY    . TONSILLECTOMY     Past Medical History:  Diagnosis Date  . Anxiety   . Chronic diastolic heart failure (HCC)   . Chronic pain    hips back and shoulders  . COPD (chronic obstructive pulmonary disease) (HCC)   . Depression   . Hypothyroidism 09/08/2015  . Tachycardia   . Thyroid disease    BP (!) 105/48   Pulse 74   Temp 98.2 F (36.8 C)   Ht 5\' 6"  (1.676 m)   Wt 130 lb 9.6 oz (59.2 kg)   SpO2 90%   BMI 21.08 kg/m   Opioid Risk Score:   Fall Risk Score:  `1  Depression screen PHQ 2/9  Depression screen [facility] 2/9 01/04/2020 11/02/2019 04/01/2018 03/01/2018 02/01/2018 01/08/2018 08/26/2017  Decreased Interest 0 0 0 0 0 0 0  Down, Depressed, Hopeless 0 0 0  0 0 0 0  PHQ - 2 Score 0 0 0 0 0 0 0  Altered sleeping - 0 - - - - 0  Tired, decreased energy - 0 - - - - 0  Change in appetite - 0 - - - - 0  Feeling bad or failure about yourself  - 1 - - - - 0  Trouble concentrating - 0 - - - - 0  Moving slowly or fidgety/restless - 0 - - - - 0  Suicidal thoughts - 0 - - - - 0  PHQ-9 Score - 1 - - - - 0  Difficult doing work/chores - - - - - - Not difficult at all   Review of Systems  Constitutional: Negative.   HENT: Negative.   Eyes: Negative.   Respiratory: Negative.   Cardiovascular: Negative.   Gastrointestinal: Negative.   Endocrine: Negative.   Genitourinary: Negative.   Musculoskeletal: Positive for back pain.  Skin: Negative.   Allergic/Immunologic: Negative.    Neurological: Negative.   Hematological: Negative.   Psychiatric/Behavioral: Negative.   All other systems reviewed and are negative.      Objective:   Physical Exam Vitals and nursing note reviewed.  Constitutional:      Appearance: Normal appearance.  Cardiovascular:     Rate and Rhythm: Normal rate and regular rhythm.     Pulses: Normal pulses.     Heart sounds: Normal heart sounds.  Pulmonary:     Effort: Pulmonary effort is normal.     Breath sounds: Normal breath sounds.  Musculoskeletal:     Cervical back: Normal range of motion and neck supple.     Comments: Normal Muscle Bulk and Muscle Testing Reveals:  Upper Extremities: Full ROM and Muscle Strength 5/5 Lumbar Paraspinal Tenderness: L-3-L-5 Left Greater Trochanter Tenderness Lower Extremities: Full ROM and Muscle Strength 5/5 Arises from Table slowly  Narrow Based  Gait   Skin:    General: Skin is warm and dry.  Neurological:     Mental Status: She is alert and oriented to person, place, and time.  Psychiatric:        Mood and Affect: Mood normal.        Behavior: Behavior normal.           Assessment & Plan:  1.Left Lumbar Radiculitis/Chronic low back pain likely related to lumbar spondylosis and facet arthropathy: Continue HEP as Tolerated.04/25/2020. 2. OA of Left Knee: Refilled:Oxycodone 10/325 mg one tablet every 8 hours as needed #90.04/25/2020 We will continue the opioid monitoring program, this consists of regular clinic visits, examinations, urine drug screen, pill counts as well as use of [place] Controlled Substance Reporting system. A 12 month History has been reviewed on the [place] Controlled Substance Reporting Systemon 04/25/2020. 3. Muscle Spasms: Continuecurrent medication regimen withFlexeril as needed.04/25/2020 4. Left Greater Trochanter Bursitis: S/P Left Cortisone injection on 03/07/2020 with good relief noted. Continue to Alternate with Heat and Ice Therapy.  Continue to Monitor.04/25/2020  F/U in 1 month

## 2020-05-07 DIAGNOSIS — J0101 Acute recurrent maxillary sinusitis: Secondary | ICD-10-CM | POA: Diagnosis not present

## 2020-05-07 DIAGNOSIS — Z20828 Contact with and (suspected) exposure to other viral communicable diseases: Secondary | ICD-10-CM | POA: Diagnosis not present

## 2020-05-11 DIAGNOSIS — E7849 Other hyperlipidemia: Secondary | ICD-10-CM | POA: Diagnosis not present

## 2020-05-11 DIAGNOSIS — Z720 Tobacco use: Secondary | ICD-10-CM | POA: Diagnosis not present

## 2020-05-11 DIAGNOSIS — J441 Chronic obstructive pulmonary disease with (acute) exacerbation: Secondary | ICD-10-CM | POA: Diagnosis not present

## 2020-05-11 DIAGNOSIS — I10 Essential (primary) hypertension: Secondary | ICD-10-CM | POA: Diagnosis not present

## 2020-05-23 ENCOUNTER — Other Ambulatory Visit: Payer: Self-pay

## 2020-05-23 ENCOUNTER — Encounter: Payer: Self-pay | Admitting: Registered Nurse

## 2020-05-23 ENCOUNTER — Encounter: Payer: Medicare Other | Attending: Physical Medicine & Rehabilitation | Admitting: Registered Nurse

## 2020-05-23 VITALS — Ht 66.0 in | Wt 130.0 lb

## 2020-05-23 DIAGNOSIS — G894 Chronic pain syndrome: Secondary | ICD-10-CM | POA: Insufficient documentation

## 2020-05-23 DIAGNOSIS — M7062 Trochanteric bursitis, left hip: Secondary | ICD-10-CM | POA: Insufficient documentation

## 2020-05-23 DIAGNOSIS — Z5181 Encounter for therapeutic drug level monitoring: Secondary | ICD-10-CM

## 2020-05-23 DIAGNOSIS — M1712 Unilateral primary osteoarthritis, left knee: Secondary | ICD-10-CM | POA: Insufficient documentation

## 2020-05-23 DIAGNOSIS — M5136 Other intervertebral disc degeneration, lumbar region: Secondary | ICD-10-CM | POA: Diagnosis not present

## 2020-05-23 DIAGNOSIS — M5416 Radiculopathy, lumbar region: Secondary | ICD-10-CM | POA: Insufficient documentation

## 2020-05-23 MED ORDER — OXYCODONE-ACETAMINOPHEN 10-325 MG PO TABS
1.0000 | ORAL_TABLET | Freq: Three times a day (TID) | ORAL | 0 refills | Status: DC | PRN
Start: 2020-05-23 — End: 2020-06-27

## 2020-05-23 NOTE — Progress Notes (Addendum)
Subjective:    Patient ID: Ashley Houston, female    DOB: [birth year], 80 y.o.   MRN: [id number]  HPI: Ashley Houston is a 80 y.o. female whose appointment was changed to a My-Chart Video visit. Her daughter called office today reporting Ashley Houston having coughing, runny nose (rhinorrhea) and chest congestion. Her appointment was changed to a My- Chart Video Visit. Her daughter and Ashley Houston  reports she has seen her PCP and was prescribed an antibiotic, she also had a COVID test which was negative. Ashley Houston agrees with My-Chart Video Visit and verbalizes understanding.  She states her pain is located in her lower back radiating into his left hip and left lower extremity and left knee pain. She rates her pain 4. Her current exercise regime is walking in her home.   Ashley Houston Morphine equivalent is 90.00 MME. Last Oral Swab was Performed on 03/28/2020, it was consistent.   Angela Nevin CMA asked the Health and History Questions. This provider and Purvis Sheffield verified we were speaking with the correct person using two identifiers.   Pain Inventory Average Pain 8 Pain Right Now 4 My pain is intermittent, dull and aching  In the last 24 hours, has pain interfered with the following? General activity 4 Relation with others 4 Enjoyment of life 1 What TIME of day is your pain at its worst? morning  Sleep (in general) Good  Pain is worse with: walking, bending, sitting, inactivity, standing and some activites Pain improves with: rest and medication Relief from Meds: 10  No family history on file. Social History   Socioeconomic History  . Marital status: Widowed    Spouse name: Not on file  . Number of children: Not on file  . Years of education: Not on file  . Highest education level: Not on file  Occupational History  . Not on file  Tobacco Use  . Smoking status: Current Every Day Smoker    Packs/day: 0.50    Types: Cigarettes  . Smokeless tobacco: Never Used   Substance and Sexual Activity  . Alcohol use: No  . Drug use: No  . Sexual activity: Not on file  Other Topics Concern  . Not on file  Social History Narrative  . Not on file   Social Determinants of Health   Financial Resource Strain: Not on file  Food Insecurity: Not on file  Transportation Needs: Not on file  Physical Activity: Not on file  Stress: Not on file  Social Connections: Not on file   Past Surgical History:  Procedure Laterality Date  . ABDOMINAL HYSTERECTOMY    . CHOLECYSTECTOMY    . TONSILLECTOMY     Past Surgical History:  Procedure Laterality Date  . ABDOMINAL HYSTERECTOMY    . CHOLECYSTECTOMY    . TONSILLECTOMY     Past Medical History:  Diagnosis Date  . Anxiety   . Chronic diastolic heart failure (HCC)   . Chronic pain    hips back and shoulders  . COPD (chronic obstructive pulmonary disease) (HCC)   . Depression   . Hypothyroidism 09/08/2015  . Tachycardia   . Thyroid disease    Ht 5\' 6"  (1.676 m)   Wt 130 lb (59 kg)   BMI 20.98 kg/m   Opioid Risk Score:   Fall Risk Score:  `1  Depression screen PHQ 2/9  Depression screen [facility] 2/9 04/25/2020 01/04/2020 11/02/2019 04/01/2018 03/01/2018 02/01/2018 01/08/2018  Decreased Interest 0 0 0 0 0 0 0  Down,  Depressed, Hopeless 0 0 0 0 0 0 0  PHQ - 2 Score 0 0 0 0 0 0 0  Altered sleeping - - 0 - - - -  Tired, decreased energy - - 0 - - - -  Change in appetite - - 0 - - - -  Feeling bad or failure about yourself  - - 1 - - - -  Trouble concentrating - - 0 - - - -  Moving slowly or fidgety/restless - - 0 - - - -  Suicidal thoughts - - 0 - - - -  PHQ-9 Score - - 1 - - - -  Difficult doing work/chores - - - - - - -    Review of Systems  Constitutional: Negative.   HENT: Negative.   Eyes: Positive for visual disturbance.  Respiratory: Negative.   Cardiovascular: Negative.   Gastrointestinal: Negative.   Genitourinary: Negative.   Musculoskeletal: Positive for arthralgias, back pain and gait  problem.  Skin: Negative.   Allergic/Immunologic: Negative.   Hematological: Negative.   Psychiatric/Behavioral: Negative.   All other systems reviewed and are negative.      Objective:   Physical Exam Vitals and nursing note reviewed.  Musculoskeletal:     Comments: No Physical Exam Performed : My-Chart Video Visit           Assessment & Plan:  1.Left Lumbar Radiculitis/Chronic low back pain likely related to lumbar spondylosis and facet arthropathy: Continue HEP as Tolerated.05/23/2020. 2. OA of Left Knee: Refilled:Oxycodone 10/325 mg one tablet every 8 hours as needed #90.05/23/2020 We will continue the opioid monitoring program, this consists of regular clinic visits, examinations, urine drug screen, pill counts as well as use of [place] Controlled Substance Reporting system. A 12 month History has been reviewed on the [place] Controlled Substance Reporting Systemon 05/23/2020. 3. Muscle Spasms: Continuecurrent medication regimen withFlexeril as needed.05/23/2020 4. Left Greater Trochanter Bursitis:S/P Left Cortisone injection on 03/07/2020 with good relief noted.Continue to Alternate with Heat and Ice Therapy. Continue to Monitor.05/23/2020  F/U in 1 month  My-Chart Video Visit Establish Patient Location of Patient in her Home Location of Provider: In the Office  Supervising Provider: Dr Faith Rogue

## 2020-05-29 DIAGNOSIS — I10 Essential (primary) hypertension: Secondary | ICD-10-CM | POA: Diagnosis not present

## 2020-05-29 DIAGNOSIS — I350 Nonrheumatic aortic (valve) stenosis: Secondary | ICD-10-CM | POA: Diagnosis not present

## 2020-05-29 DIAGNOSIS — E782 Mixed hyperlipidemia: Secondary | ICD-10-CM | POA: Diagnosis not present

## 2020-05-29 DIAGNOSIS — F411 Generalized anxiety disorder: Secondary | ICD-10-CM | POA: Diagnosis not present

## 2020-06-01 ENCOUNTER — Telehealth: Payer: Self-pay | Admitting: *Deleted

## 2020-06-01 NOTE — Telephone Encounter (Signed)
Prior auth submitted to [facility] for Oxycodone 10-325 #90  Approval 05/31/20-05/31/21.  Mrs [place] and pharmacy notified.

## 2020-06-09 DIAGNOSIS — E7849 Other hyperlipidemia: Secondary | ICD-10-CM | POA: Diagnosis not present

## 2020-06-09 DIAGNOSIS — Z720 Tobacco use: Secondary | ICD-10-CM | POA: Diagnosis not present

## 2020-06-09 DIAGNOSIS — I10 Essential (primary) hypertension: Secondary | ICD-10-CM | POA: Diagnosis not present

## 2020-06-09 DIAGNOSIS — J441 Chronic obstructive pulmonary disease with (acute) exacerbation: Secondary | ICD-10-CM | POA: Diagnosis not present

## 2020-06-27 ENCOUNTER — Encounter: Payer: Self-pay | Admitting: Registered Nurse

## 2020-06-27 ENCOUNTER — Other Ambulatory Visit: Payer: Self-pay

## 2020-06-27 ENCOUNTER — Encounter: Payer: Medicare Other | Attending: Physical Medicine & Rehabilitation | Admitting: Registered Nurse

## 2020-06-27 VITALS — BP 130/69 | HR 92 | Temp 98.9°F | Ht 66.0 in | Wt 132.0 lb

## 2020-06-27 DIAGNOSIS — M51369 Other intervertebral disc degeneration, lumbar region without mention of lumbar back pain or lower extremity pain: Secondary | ICD-10-CM

## 2020-06-27 DIAGNOSIS — G894 Chronic pain syndrome: Secondary | ICD-10-CM | POA: Diagnosis not present

## 2020-06-27 DIAGNOSIS — M7062 Trochanteric bursitis, left hip: Secondary | ICD-10-CM | POA: Diagnosis not present

## 2020-06-27 DIAGNOSIS — M5416 Radiculopathy, lumbar region: Secondary | ICD-10-CM | POA: Diagnosis not present

## 2020-06-27 DIAGNOSIS — M5136 Other intervertebral disc degeneration, lumbar region: Secondary | ICD-10-CM | POA: Diagnosis not present

## 2020-06-27 DIAGNOSIS — Z79891 Long term (current) use of opiate analgesic: Secondary | ICD-10-CM | POA: Diagnosis not present

## 2020-06-27 DIAGNOSIS — M1712 Unilateral primary osteoarthritis, left knee: Secondary | ICD-10-CM | POA: Diagnosis not present

## 2020-06-27 DIAGNOSIS — Z5181 Encounter for therapeutic drug level monitoring: Secondary | ICD-10-CM | POA: Diagnosis not present

## 2020-06-27 MED ORDER — OXYCODONE-ACETAMINOPHEN 10-325 MG PO TABS
1.0000 | ORAL_TABLET | Freq: Three times a day (TID) | ORAL | 0 refills | Status: DC | PRN
Start: 2020-06-27 — End: 2020-07-25

## 2020-06-27 NOTE — Progress Notes (Signed)
Subjective:    Patient ID: Ashley Houston, female    DOB: Dec 08, 1940, 80 y.o.   MRN: [id number]  HPI: Ashley Houston is a 80 y.o. female who returns for follow up appointment for chronic pain and medication refill. She states her  pain is located in her lower back pain radiating into her left hip and left lower extremity. Also reports left knee pain. She rates her pain 0. Her current exercise regime is walking and performing stretching exercises.  Ashley Houston Morphine equivalent is 52.50  MME. She is also prescribed Lorazepam by Ashley Moris Pa-C. We have discussed the black box warning of using opioids and benzodiazepines. I highlighted the dangers of using these drugs together and discussed the adverse events including respiratory suppression, overdose, cognitive impairment and importance of compliance with current regimen. We will continue to monitor and adjust as indicated.   Last Oral Swab was Performed on 03/28/2020, it was consistent.    Pain Inventory Average Pain 8 Pain Right Now 0 My pain is intermittent, dull and aching  In the last 24 hours, has pain interfered with the following? General activity 7 Relation with others 5 Enjoyment of life 5 What TIME of day is your pain at its worst? morning , daytime and evening Sleep (in general) Good  Pain is worse with: walking, bending, sitting and standing Pain improves with: rest, pacing activities, medication and heat Relief from Meds: 10  No family history on file. Social History   Socioeconomic History  . Marital status: Widowed    Spouse name: Not on file  . Number of children: Not on file  . Years of education: Not on file  . Highest education level: Not on file  Occupational History  . Not on file  Tobacco Use  . Smoking status: Current Every Day Smoker    Packs/day: 0.50    Types: Cigarettes  . Smokeless tobacco: Never Used  Substance and Sexual Activity  . Alcohol use: No  . Drug use: No  . Sexual  activity: Not on file  Other Topics Concern  . Not on file  Social History Narrative  . Not on file   Social Determinants of Health   Financial Resource Strain: Not on file  Food Insecurity: Not on file  Transportation Needs: Not on file  Physical Activity: Not on file  Stress: Not on file  Social Connections: Not on file   Past Surgical History:  Procedure Laterality Date  . ABDOMINAL HYSTERECTOMY    . CHOLECYSTECTOMY    . TONSILLECTOMY     Past Surgical History:  Procedure Laterality Date  . ABDOMINAL HYSTERECTOMY    . CHOLECYSTECTOMY    . TONSILLECTOMY     Past Medical History:  Diagnosis Date  . Anxiety   . Chronic diastolic heart failure (HCC)   . Chronic pain    hips back and shoulders  . COPD (chronic obstructive pulmonary disease) (HCC)   . Depression   . Hypothyroidism 09/08/2015  . Tachycardia   . Thyroid disease    There were no vitals taken for this visit.  Opioid Risk Score:   Fall Risk Score:  `1  Depression screen PHQ 2/9  Depression screen [facility] 2/9 04/25/2020 01/04/2020 11/02/2019 04/01/2018 03/01/2018 02/01/2018 01/08/2018  Decreased Interest 0 0 0 0 0 0 0  Down, Depressed, Hopeless 0 0 0 0 0 0 0  PHQ - 2 Score 0 0 0 0 0 0 0  Altered sleeping - - 0 - - - -  Tired, decreased energy - - 0 - - - -  Change in appetite - - 0 - - - -  Feeling bad or failure about yourself  - - 1 - - - -  Trouble concentrating - - 0 - - - -  Moving slowly or fidgety/restless - - 0 - - - -  Suicidal thoughts - - 0 - - - -  PHQ-9 Score - - 1 - - - -  Difficult doing work/chores - - - - - - -   Review of Systems  Musculoskeletal: Positive for back pain.       Left knee, left hip pain  All other systems reviewed and are negative.      Objective:   Physical Exam Vitals and nursing note reviewed.  Constitutional:      Appearance: Normal appearance.  Cardiovascular:     Rate and Rhythm: Normal rate and regular rhythm.     Pulses: Normal pulses.     Heart sounds:  Normal heart sounds.  Pulmonary:     Effort: Pulmonary effort is normal.     Breath sounds: Normal breath sounds.  Musculoskeletal:     Cervical back: Normal range of motion and neck supple.     Comments: Normal Muscle Bulk and Muscle Testing Reveals:  Upper Extremities: Full ROM and Muscle Strength 5/5  Lumbar Hypersensitivity  Left Greater Trochanter Tenderness Lower Extremities: Full ROM and Muscle Strength 5/5 Arises from chair slowly  Antalgic Gait   Skin:    General: Skin is warm and dry.  Neurological:     Mental Status: She is alert and oriented to person, place, and time.  Psychiatric:        Mood and Affect: Mood normal.        Behavior: Behavior normal.           Assessment & Plan:  1.Left Lumbar Radiculitis/Chronic low back pain likely related to lumbar spondylosis and facet arthropathy: Continue HEP as Tolerated.06/27/2020. 2. OA of Left Knee: .Refilled:Oxycodone 10/325 mg one tablet every 8 hours as needed #90.06/27/2020 We will continue the opioid monitoring program, this consists of regular clinic visits, examinations, urine drug screen, pill counts as well as use of [place] Controlled Substance Reporting system. A 12 month History has been reviewed on the [place] Controlled Substance Reporting Systemon02/16/2022. 3. Muscle Spasms: Continuecurrent medication regimen withFlexeril as needed.06/27/2020 4. Left Greater Trochanter Bursitis:Scheduled for Left Hip Injection with Dr Carlis Abbott.S/P Left Cortisone injectionon 10/27/2021with good relief noted.Continue to Alternate with Heat and Ice Therapy. Continue to Monitor.06/27/2020  F/U in 1 month

## 2020-07-09 DIAGNOSIS — I10 Essential (primary) hypertension: Secondary | ICD-10-CM | POA: Diagnosis not present

## 2020-07-09 DIAGNOSIS — J441 Chronic obstructive pulmonary disease with (acute) exacerbation: Secondary | ICD-10-CM | POA: Diagnosis not present

## 2020-07-09 DIAGNOSIS — Z720 Tobacco use: Secondary | ICD-10-CM | POA: Diagnosis not present

## 2020-07-09 DIAGNOSIS — E7849 Other hyperlipidemia: Secondary | ICD-10-CM | POA: Diagnosis not present

## 2020-07-25 ENCOUNTER — Encounter: Payer: Self-pay | Admitting: Registered Nurse

## 2020-07-25 ENCOUNTER — Other Ambulatory Visit: Payer: Self-pay

## 2020-07-25 ENCOUNTER — Encounter: Payer: Medicare Other | Attending: Physical Medicine & Rehabilitation | Admitting: Registered Nurse

## 2020-07-25 VITALS — BP 120/70 | HR 74 | Temp 98.7°F | Ht 66.0 in | Wt 132.6 lb

## 2020-07-25 DIAGNOSIS — G894 Chronic pain syndrome: Secondary | ICD-10-CM | POA: Insufficient documentation

## 2020-07-25 DIAGNOSIS — M5136 Other intervertebral disc degeneration, lumbar region: Secondary | ICD-10-CM | POA: Diagnosis not present

## 2020-07-25 DIAGNOSIS — Z5181 Encounter for therapeutic drug level monitoring: Secondary | ICD-10-CM | POA: Diagnosis not present

## 2020-07-25 DIAGNOSIS — M5416 Radiculopathy, lumbar region: Secondary | ICD-10-CM | POA: Diagnosis not present

## 2020-07-25 DIAGNOSIS — M7062 Trochanteric bursitis, left hip: Secondary | ICD-10-CM | POA: Insufficient documentation

## 2020-07-25 DIAGNOSIS — Z79891 Long term (current) use of opiate analgesic: Secondary | ICD-10-CM | POA: Diagnosis not present

## 2020-07-25 DIAGNOSIS — M1712 Unilateral primary osteoarthritis, left knee: Secondary | ICD-10-CM

## 2020-07-25 MED ORDER — OXYCODONE-ACETAMINOPHEN 10-325 MG PO TABS
1.0000 | ORAL_TABLET | Freq: Three times a day (TID) | ORAL | 0 refills | Status: DC | PRN
Start: 2020-07-25 — End: 2020-08-15

## 2020-07-25 NOTE — Progress Notes (Signed)
Subjective:    Patient ID: Ashley Houston, female    DOB: 08/01/1940, 80 y.o.   MRN: [id number]  HPI: Ashley Houston is a 80 y.o. female who returns for follow up appointment for chronic pain and medication refill. She states her  pain is located in her lower back radiating into her left hip and left lower extremity and left knee pain. Se rated her pain on health history 0, she reports she is having excruciating left hip pain. She is scheduled for left hip injection with Dr Carlis Abbott on 08/15/2020, she realizes she's on the waiting list if an appointment comes available sooner, she verbalizes understanding. Her current exercise regime is walking and performing stretching exercises.  Ms. Fiorentino Morphine equivalent is 45.00  MME. She is also prescribed Lorazepam by [name]. We have discussed the black box warning of using opioids and benzodiazepines. I highlighted the dangers of using these drugs together and discussed the adverse events including respiratory suppression, overdose, cognitive impairment and importance of compliance with current regimen. We will continue to monitor and adjust as indicated.   . UDS ordered today.    Pain Inventory Average Pain 8 Pain Right Now 0 My pain is intermittent, dull and aching  In the last 24 hours, has pain interfered with the following? General activity 5 Relation with others 7 Enjoyment of life 7 What TIME of day is your pain at its worst? morning , daytime and evening Sleep (in general) Good  Pain is worse with: walking, bending, sitting and standing Pain improves with: rest, pacing activities, medication and heat Relief from Meds: 10  No family history on file. Social History   Socioeconomic History  . Marital status: Widowed    Spouse name: Not on file  . Number of children: Not on file  . Years of education: Not on file  . Highest education level: Not on file  Occupational History  . Not on file  Tobacco Use  . Smoking  status: Current Every Day Smoker    Packs/day: 0.50    Types: Cigarettes  . Smokeless tobacco: Never Used  Vaping Use  . Vaping Use: Former  Substance and Sexual Activity  . Alcohol use: No  . Drug use: No  . Sexual activity: Not on file  Other Topics Concern  . Not on file  Social History Narrative  . Not on file   Social Determinants of Health   Financial Resource Strain: Not on file  Food Insecurity: Not on file  Transportation Needs: Not on file  Physical Activity: Not on file  Stress: Not on file  Social Connections: Not on file   Past Surgical History:  Procedure Laterality Date  . ABDOMINAL HYSTERECTOMY    . CHOLECYSTECTOMY    . TONSILLECTOMY     Past Surgical History:  Procedure Laterality Date  . ABDOMINAL HYSTERECTOMY    . CHOLECYSTECTOMY    . TONSILLECTOMY     Past Medical History:  Diagnosis Date  . Anxiety   . Chronic diastolic heart failure (HCC)   . Chronic pain    hips back and shoulders  . COPD (chronic obstructive pulmonary disease) (HCC)   . Depression   . Hypothyroidism 09/08/2015  . Tachycardia   . Thyroid disease    There were no vitals taken for this visit.  Opioid Risk Score:   Fall Risk Score:  `1  Depression screen [facility] 2/9  Depression screen [facility] 2/9 06/27/2020 04/25/2020 01/04/2020 11/02/2019 04/01/2018 03/01/2018 02/01/2018  Decreased  Interest 0 0 0 0 0 0 0  Down, Depressed, Hopeless 0 0 0 0 0 0 0  PHQ - 2 Score 0 0 0 0 0 0 0  Altered sleeping - - - 0 - - -  Tired, decreased energy - - - 0 - - -  Change in appetite - - - 0 - - -  Feeling bad or failure about yourself  - - - 1 - - -  Trouble concentrating - - - 0 - - -  Moving slowly or fidgety/restless - - - 0 - - -  Suicidal thoughts - - - 0 - - -  PHQ-9 Score - - - 1 - - -  Difficult doing work/chores - - - - - - -    Review of Systems  Musculoskeletal: Positive for back pain.       Left knee and hip pain  All other systems reviewed and are negative.      Objective:    Physical Exam Vitals and nursing note reviewed.  Constitutional:      Appearance: Normal appearance.  Cardiovascular:     Rate and Rhythm: Normal rate and regular rhythm.     Pulses: Normal pulses.     Heart sounds: Normal heart sounds.  Pulmonary:     Effort: Pulmonary effort is normal.     Breath sounds: Normal breath sounds.  Musculoskeletal:     Cervical back: Normal range of motion and neck supple.     Comments: Normal Muscle Bulk and Muscle Testing Reveals:  Upper Extremities: Full ROM and Muscle Strength 5/5 Lumbar Paraspinal Tenderness: L-3-L-5 Left Greater Trochanter Tenderness Lower Extremities: Full ROM and Muscle Strength 5/5 Arises from Table slowly using cane for support Antalgic  Gait   Skin:    General: Skin is warm and dry.  Neurological:     Mental Status: She is alert and oriented to person, place, and time.  Psychiatric:        Mood and Affect: Mood normal.        Behavior: Behavior normal.           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 2. OA of Left Knee: .Refilled:Oxycodone 10/325 mg one tablet every 8 hours as needed #90.07/25/2020 We will continue the opioid monitoring program, this consists of regular clinic visits, examinations, urine drug screen, pill counts as well as use of [place] Controlled Substance Reporting system. A 12 month History has been reviewed on the [place] Controlled Substance Reporting Systemon03/16/2022. 3. Muscle Spasms: Continuecurrent medication regimen withFlexeril as needed.07/25/2020 4. Left Greater Trochanter Bursitis:Scheduled for Left Hip Injection with Dr Carlis Abbott.S/P Left Cortisone injectionon 10/27/2021with good relief noted.Continue to Alternate with Heat and Ice Therapy. Continue to Monitor.07/25/2020  F/U in 1 month

## 2020-07-27 ENCOUNTER — Telehealth: Payer: Self-pay | Admitting: *Deleted

## 2020-07-27 MED ORDER — METHYLPREDNISOLONE 4 MG PO TBPK: ORAL_TABLET | ORAL | 0 refills | Status: DC

## 2020-07-27 NOTE — Telephone Encounter (Signed)
This provider was in contact with Dr Carlis Abbott regarding Ashley Houston reporting increase intensity of left hip pain. Medrol dose Pak will be ordered and she's on the schedule with Dr Carlis Abbott in April. [name] was instructed to call office in two weeks with an update, she verbalizes understanding.

## 2020-07-27 NOTE — Telephone Encounter (Signed)
Mrs Toman called this am about her pain being severe and asking for Dr Carlis Abbott. She is wanting to know if she could be worked in today.  (Unfortunately Dr Carlis Abbott is not in the office today.) She says the shot she gave her wore off in February and her pain is now so severe the pain medication is not helping at all. According to Riley Lam note with her from yesterday, "she is scheduled for left hip injection with Dr Carlis Abbott on 08/15/2020, and she realizes she's on the waiting list if an appointment comes available sooner".  She says the pain is severe. Please advise.

## 2020-08-05 LAB — TOXASSURE SELECT,+ANTIDEPR,UR

## 2020-08-07 ENCOUNTER — Telehealth: Payer: Self-pay | Admitting: *Deleted

## 2020-08-07 NOTE — Telephone Encounter (Signed)
Urine drug screen for this encounter is consistent for prescribed medication. There urine test was done within the 24 hour window (encounter was 07/25/20 and test collected 07/26/20).

## 2020-08-15 ENCOUNTER — Other Ambulatory Visit: Payer: Self-pay

## 2020-08-15 ENCOUNTER — Encounter: Payer: Medicare Other | Attending: Physical Medicine & Rehabilitation | Admitting: Physical Medicine and Rehabilitation

## 2020-08-15 ENCOUNTER — Encounter: Payer: Self-pay | Admitting: Physical Medicine and Rehabilitation

## 2020-08-15 VITALS — BP 100/57 | HR 76 | Temp 98.3°F | Ht 66.0 in | Wt 133.0 lb

## 2020-08-15 DIAGNOSIS — M7062 Trochanteric bursitis, left hip: Secondary | ICD-10-CM

## 2020-08-15 MED ORDER — OXYCODONE-ACETAMINOPHEN 10-325 MG PO TABS: 1.0000 | ORAL_TABLET | Freq: Three times a day (TID) | ORAL | 0 refills | Status: DC | PRN

## 2020-08-15 NOTE — Patient Instructions (Signed)
Blue emu oil  -Discussed following foods that may reduce pain: 1) Ginger 2) Blueberries 3) Salmon 4) Pumpkin seeds 5) dark chocolate 6) turmeric 7) tart cherries 8) virgin olive oil 9) chilli peppers 10) mint 11) garlic  Link to further information on diet for chronic pain: [email or web address]/[email or web address]  

## 2020-08-15 NOTE — Progress Notes (Signed)
Left Trochanteric bursa injection   Indication Trochanteric bursitis. Exam has tenderness over the greater trochanter of the hip. Pain has not responded to conservative care such as exercise therapy and oral medications. Pain interferes with sleep or with mobility Informed consent was obtained after describing risks and benefits of the procedure with the patient these include bleeding bruising and infection. Patient has signed written consent form. Patient placed in a lateral decubitus position with the affected hip superior. Point of maximal pain was palpated marked and prepped with Betadine and entered with a needle to bone contact. Needle slightly withdrawn then 1 cc celestone with 3 cc 1% lidocaine were injected. Patient tolerated procedure well. Post procedure instructions given.

## 2020-08-27 ENCOUNTER — Telehealth: Payer: Self-pay | Admitting: *Deleted

## 2020-08-27 NOTE — Telephone Encounter (Signed)
Ashley Houston called and is requesting a call from Dr Carlis Abbott.  She only said that Dr Carlis Abbott told her to call if she needed to speak with her. Her number is [phone].

## 2020-08-27 NOTE — Telephone Encounter (Signed)
Called.

## 2020-09-04 NOTE — Addendum Note (Signed)
Addended by: Angela Nevin D on: 09/04/2020 08:35 AM   Modules accepted: Orders

## 2020-09-05 ENCOUNTER — Telehealth: Payer: Self-pay | Admitting: *Deleted

## 2020-09-05 NOTE — Telephone Encounter (Signed)
Prior auth submitted to [facility] via CoverMyMeds for oxycodone 10/325. Approval received Effective from 09/04/2020 through 09/04/2021.

## 2020-09-08 DIAGNOSIS — E7849 Other hyperlipidemia: Secondary | ICD-10-CM | POA: Diagnosis not present

## 2020-09-08 DIAGNOSIS — I10 Essential (primary) hypertension: Secondary | ICD-10-CM | POA: Diagnosis not present

## 2020-09-08 DIAGNOSIS — J441 Chronic obstructive pulmonary disease with (acute) exacerbation: Secondary | ICD-10-CM | POA: Diagnosis not present

## 2020-09-08 DIAGNOSIS — Z720 Tobacco use: Secondary | ICD-10-CM | POA: Diagnosis not present

## 2020-09-18 NOTE — Progress Notes (Signed)
Subjective:    Patient ID: Ashley Houston, female    DOB: 05-Jun-1940, 80 y.o.   MRN: [id number]  HPI: Ashley Houston is a 80 y.o. female who returns for follow up appointment for chronic pain and medication refill. She states her pain is located in her lower back radiating into her left hip, also reports increase intensity of lower back pain and left knee pain. Ashley Houston also reports muscle weakness in her lower extremities with standing, she refuses physical therapy at this time, she was instructed to call office if she changes her mind, she verbalizes understanding. She did not rate her pain today, average pain 9. Her current exercise regime is walking and performing stretching exercises.  Ashley Houston Morphine equivalent is 45.00 MME. She is also prescribed Lorazepam by [name]. We have discussed the black box warning of using opioids and benzodiazepines. I highlighted the dangers of using these drugs together and discussed the adverse events including respiratory suppression, overdose, cognitive impairment and importance of compliance with current regimen. We will continue to monitor and adjust as indicated.   Last UDS was Performed on 07/26/2020, it was consistent.     Pain Inventory Average Pain 9 Pain Right Now 0 My pain is intermittent, dull and aching  In the last 24 hours, has pain interfered with the following? General activity 3 Relation with others 3 Enjoyment of life 7 What TIME of day is your pain at its worst? morning , daytime and evening Sleep (in general) Good  Pain is worse with: walking, bending, sitting and standing Pain improves with: rest, heat/ice, pacing activities and medication Relief from Meds: 7  No family history on file. Social History   Socioeconomic History  . Marital status: Widowed    Spouse name: Not on file  . Number of children: Not on file  . Years of education: Not on file  . Highest education level: Not on file   Occupational History  . Not on file  Tobacco Use  . Smoking status: Current Every Day Smoker    Packs/day: 0.50    Types: Cigarettes  . Smokeless tobacco: Never Used  Vaping Use  . Vaping Use: Former  Substance and Sexual Activity  . Alcohol use: No  . Drug use: No  . Sexual activity: Not on file  Other Topics Concern  . Not on file  Social History Narrative  . Not on file   Social Determinants of Health   Financial Resource Strain: Not on file  Food Insecurity: Not on file  Transportation Needs: Not on file  Physical Activity: Not on file  Stress: Not on file  Social Connections: Not on file   Past Surgical History:  Procedure Laterality Date  . ABDOMINAL HYSTERECTOMY    . CHOLECYSTECTOMY    . TONSILLECTOMY     Past Surgical History:  Procedure Laterality Date  . ABDOMINAL HYSTERECTOMY    . CHOLECYSTECTOMY    . TONSILLECTOMY     Past Medical History:  Diagnosis Date  . Anxiety   . Chronic diastolic heart failure (HCC)   . Chronic pain    hips back and shoulders  . COPD (chronic obstructive pulmonary disease) (HCC)   . Depression   . Hypothyroidism 09/08/2015  . Tachycardia   . Thyroid disease    BP 115/68   Pulse 75   Temp 98.7 F (37.1 C)   Ht 5\' 6"  (1.676 m)   Wt 133 lb 3.2 oz (60.4 kg)   SpO2  90%   BMI 21.50 kg/m   Opioid Risk Score:   Fall Risk Score:  `1  Depression screen [facility] 2/9  Depression screen [facility] 2/9 06/27/2020 04/25/2020 01/04/2020 11/02/2019 04/01/2018 03/01/2018 02/01/2018  Decreased Interest 0 0 0 0 0 0 0  Down, Depressed, Hopeless 0 0 0 0 0 0 0  PHQ - 2 Score 0 0 0 0 0 0 0  Altered sleeping - - - 0 - - -  Tired, decreased energy - - - 0 - - -  Change in appetite - - - 0 - - -  Feeling bad or failure about yourself  - - - 1 - - -  Trouble concentrating - - - 0 - - -  Moving slowly or fidgety/restless - - - 0 - - -  Suicidal thoughts - - - 0 - - -  PHQ-9 Score - - - 1 - - -  Difficult doing work/chores - - - - - - -     Review of Systems  Musculoskeletal: Positive for back pain.       Left knee, hip pain  All other systems reviewed and are negative.      Objective:   Physical Exam Vitals and nursing note reviewed.  Constitutional:      Appearance: Normal appearance.  Cardiovascular:     Rate and Rhythm: Normal rate and regular rhythm.     Pulses: Normal pulses.     Heart sounds: Normal heart sounds.  Pulmonary:     Effort: Pulmonary effort is normal.     Breath sounds: Normal breath sounds.  Musculoskeletal:     Cervical back: Normal range of motion and neck supple.     Comments: Normal Muscle Bulk and Muscle Testing Reveals:  Upper Extremities: Full ROM and Muscle Strength 5/5 Lumbar Paraspinal Tenderness: L-3-L-5 Lower Extremities: Full ROM and Muscle Strength 5/5 Arises from Table slowly Narrow Based  Gait   Skin:    General: Skin is warm and dry.  Neurological:     Mental Status: She is alert and oriented to person, place, and time.  Psychiatric:        Mood and Affect: Mood normal.        Behavior: Behavior normal.           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 2. OA of Left Knee:.Refilled:Oxycodone 10/325 mg one tablet every 8 hours as needed #90.09/19/2020 We will continue the opioid monitoring program, this consists of regular clinic visits, examinations, urine drug screen, pill counts as well as use of [place] Controlled Substance Reporting system. A 12 month History has been reviewed on the [place] Controlled Substance Reporting Systemon05/03/2021. 3. Muscle Spasms: Continuecurrent medication regimen withFlexeril as needed.09/19/2020 4. Left Greater Trochanter Bursitis:S/P Left Hip Injection with Dr Marye Round good relief noted.Continue to Alternate with Heat and Ice Therapy. Continue to Monitor.09/19/2020  F/U  in 1 month

## 2020-09-19 ENCOUNTER — Other Ambulatory Visit: Payer: Self-pay

## 2020-09-19 ENCOUNTER — Encounter: Payer: Self-pay | Admitting: Registered Nurse

## 2020-09-19 ENCOUNTER — Encounter: Payer: Medicare Other | Attending: Physical Medicine & Rehabilitation | Admitting: Registered Nurse

## 2020-09-19 VITALS — BP 115/68 | HR 75 | Temp 98.7°F | Ht 66.0 in | Wt 133.2 lb

## 2020-09-19 DIAGNOSIS — Z5181 Encounter for therapeutic drug level monitoring: Secondary | ICD-10-CM | POA: Diagnosis not present

## 2020-09-19 DIAGNOSIS — G894 Chronic pain syndrome: Secondary | ICD-10-CM

## 2020-09-19 DIAGNOSIS — M7062 Trochanteric bursitis, left hip: Secondary | ICD-10-CM | POA: Insufficient documentation

## 2020-09-19 DIAGNOSIS — M545 Low back pain, unspecified: Secondary | ICD-10-CM | POA: Diagnosis not present

## 2020-09-19 DIAGNOSIS — Z79891 Long term (current) use of opiate analgesic: Secondary | ICD-10-CM | POA: Diagnosis not present

## 2020-09-19 DIAGNOSIS — M5416 Radiculopathy, lumbar region: Secondary | ICD-10-CM | POA: Insufficient documentation

## 2020-09-19 DIAGNOSIS — G8929 Other chronic pain: Secondary | ICD-10-CM | POA: Insufficient documentation

## 2020-09-19 DIAGNOSIS — M5136 Other intervertebral disc degeneration, lumbar region: Secondary | ICD-10-CM | POA: Insufficient documentation

## 2020-09-19 MED ORDER — OXYCODONE-ACETAMINOPHEN 10-325 MG PO TABS: 1.0000 | ORAL_TABLET | Freq: Three times a day (TID) | ORAL | 0 refills | Status: DC | PRN

## 2020-09-24 ENCOUNTER — Ambulatory Visit (HOSPITAL_COMMUNITY)
Admission: RE | Admit: 2020-09-24 | Discharge: 2020-09-24 | Disposition: A | Discharge status: Home / Self Care | Payer: Medicare Other | Source: Ambulatory Visit | Attending: Registered Nurse | Admitting: Registered Nurse

## 2020-09-24 DIAGNOSIS — M545 Low back pain, unspecified: Secondary | ICD-10-CM | POA: Diagnosis not present

## 2020-09-24 DIAGNOSIS — G8929 Other chronic pain: Secondary | ICD-10-CM | POA: Insufficient documentation

## 2020-10-01 ENCOUNTER — Telehealth: Payer: Self-pay | Admitting: Registered Nurse

## 2020-10-01 NOTE — Telephone Encounter (Signed)
Placed a call to Ms. Kernan regarding X-Ray Results, no answer. Left message to return the call.

## 2020-10-02 ENCOUNTER — Telehealth: Payer: Self-pay

## 2020-10-02 NOTE — Telephone Encounter (Signed)
Placed a call to Ms. Estala, reviewed Lumbar X-rays, spoke with Ms. Faron about Facet injection, at this time she doesn't want to have an injection. We will continue to monitor.

## 2020-10-02 NOTE — Telephone Encounter (Signed)
Mrs Valita Righter called , stated you called her yesterday and she missed your call and would like for you to call her back today her number is [phone]. Thank you

## 2020-10-03 ENCOUNTER — Telehealth: Payer: Self-pay

## 2020-10-03 DIAGNOSIS — M5416 Radiculopathy, lumbar region: Secondary | ICD-10-CM

## 2020-10-03 NOTE — Telephone Encounter (Signed)
Return Ashley Houston call today, Ashley Houston states she would like to get an inject. Will speak with Dr Riley Kill, she verbalizes understanding.  Spoke with Dr Riley Kill, will order a Lumbar MRI, placed a call to [name] regarding the above, she verbalizes understanding.

## 2020-10-03 NOTE — Telephone Encounter (Signed)
Patient asking Ashley Houston to return call about getting injection

## 2020-10-08 DIAGNOSIS — I10 Essential (primary) hypertension: Secondary | ICD-10-CM | POA: Diagnosis not present

## 2020-10-08 DIAGNOSIS — Z720 Tobacco use: Secondary | ICD-10-CM | POA: Diagnosis not present

## 2020-10-08 DIAGNOSIS — J441 Chronic obstructive pulmonary disease with (acute) exacerbation: Secondary | ICD-10-CM | POA: Diagnosis not present

## 2020-10-08 DIAGNOSIS — E7849 Other hyperlipidemia: Secondary | ICD-10-CM | POA: Diagnosis not present

## 2020-10-09 ENCOUNTER — Telehealth: Payer: Self-pay

## 2020-10-09 NOTE — Telephone Encounter (Signed)
Spoke with April, she sent MRI order to Ms. [employer], we are awaiting there response. Placed a call to Ms. Corona regarding the above, she verbalizes understanding.

## 2020-10-09 NOTE — Telephone Encounter (Signed)
Ms Ashley Houston is calling to set up   an MRI , she was told to call back today if no one has contacted her about her MRI and states no one has contacted her.  she can be reached at [phone]

## 2020-10-20 ENCOUNTER — Other Ambulatory Visit: Payer: Medicare Other

## 2020-10-22 ENCOUNTER — Other Ambulatory Visit: Payer: Self-pay

## 2020-10-22 ENCOUNTER — Encounter: Payer: Medicare Other | Attending: Physical Medicine & Rehabilitation | Admitting: Registered Nurse

## 2020-10-22 VITALS — BP 113/65 | Temp 98.6°F | Ht 66.0 in | Wt 132.8 lb

## 2020-10-22 DIAGNOSIS — M5416 Radiculopathy, lumbar region: Secondary | ICD-10-CM | POA: Diagnosis not present

## 2020-10-22 DIAGNOSIS — Z5181 Encounter for therapeutic drug level monitoring: Secondary | ICD-10-CM

## 2020-10-22 DIAGNOSIS — G894 Chronic pain syndrome: Secondary | ICD-10-CM

## 2020-10-22 DIAGNOSIS — M5136 Other intervertebral disc degeneration, lumbar region: Secondary | ICD-10-CM

## 2020-10-22 DIAGNOSIS — Z79891 Long term (current) use of opiate analgesic: Secondary | ICD-10-CM

## 2020-10-22 DIAGNOSIS — M7062 Trochanteric bursitis, left hip: Secondary | ICD-10-CM | POA: Diagnosis not present

## 2020-10-22 MED ORDER — OXYCODONE-ACETAMINOPHEN 10-325 MG PO TABS: 1.0000 | ORAL_TABLET | Freq: Three times a day (TID) | ORAL | 0 refills | Status: DC | PRN

## 2020-10-22 NOTE — Progress Notes (Signed)
Subjective:    Patient ID: Ashley Houston, female    DOB: January 16, 1941, 80 y.o.   MRN: [id number]  HPI: Ashley Houston is a 80 y.o. female who returns for follow up appointment for chronic pain and medication refill. She states her pain is located in her lower back pain radiating into her lower back pain radiating into her left hip. She rates her pain 3. Her  current exercise regime is walking and performing stretching exercises. Ashley Houston arrived to office with oxygen desaturation , O2 sat was re-checked. Ashley Houston states she doesn't have a portable oxygen tank, she reports she was placed on a waiting list with oxygen company. Ashley Houston and her daughter was instructed to call the oxygen company, she refuses ED or Urgent Care evaluation. Ashley Houston states she will go home and put on her oxygen and re-check her oxygen saturation, and instructed to call office with reading. They verbalizes understanding.   Ashley Houston Morphine equivalent is 45.00 MME. She is also prescribed Lorazepam by [name]. We have discussed the black box warning of using opioids and benzodiazepines. I highlighted the dangers of using these drugs together and discussed the adverse events including respiratory suppression, overdose, cognitive impairment and importance of compliance with current regimen. We will continue to monitor and adjust as indicated.   Last UDS was Performed on 07/26/2020, it was consistent.     Pain Inventory Average Pain 9 Pain Right Now 3 My pain is dull and aching  In the last 24 hours, has pain interfered with the following? General activity 3 Relation with others 3 Enjoyment of life 3 What TIME of day is your pain at its worst? varies Sleep (in general) Good  Pain is worse with: walking, bending, sitting, standing, and some activites Pain improves with: heat/ice and medication Relief from Meds: 7  No family history on file. Social History   Socioeconomic History    Marital status: Widowed    Spouse name: Not on file   Number of children: Not on file   Years of education: Not on file   Highest education level: Not on file  Occupational History   Not on file  Tobacco Use   Smoking status: Every Day    Packs/day: 0.50    Pack years: 0.00    Types: Cigarettes   Smokeless tobacco: Never  Vaping Use   Vaping Use: Former  Substance and Sexual Activity   Alcohol use: No   Drug use: No   Sexual activity: Not on file  Other Topics Concern   Not on file  Social History Narrative   Not on file   Social Determinants of Health   Financial Resource Strain: Not on file  Food Insecurity: Not on file  Transportation Needs: Not on file  Physical Activity: Not on file  Stress: Not on file  Social Connections: Not on file   Past Surgical History:  Procedure Laterality Date   ABDOMINAL HYSTERECTOMY     CHOLECYSTECTOMY     TONSILLECTOMY     Past Surgical History:  Procedure Laterality Date   ABDOMINAL HYSTERECTOMY     CHOLECYSTECTOMY     TONSILLECTOMY     Past Medical History:  Diagnosis Date   Anxiety    Chronic diastolic heart failure (HCC)    Chronic pain    hips back and shoulders   COPD (chronic obstructive pulmonary disease) (HCC)    Depression    Hypothyroidism 09/08/2015   Tachycardia  Thyroid disease    BP 113/65 (BP Location: Left Arm, Patient Position: Sitting, Cuff Size: Normal)   Temp 98.6 F (37 C) (Oral)   Ht 5\' 6"  (1.676 m)   Wt 132 lb 12.8 oz (60.2 kg)   SpO2 (!) 85% Comment: false nails on  BMI 21.43 kg/m   Opioid Risk Score:   Fall Risk Score:  `1  Depression screen [facility] 2/9  Depression screen [facility] 2/9 06/27/2020 04/25/2020 01/04/2020 11/02/2019 04/01/2018 03/01/2018 02/01/2018  Decreased Interest 0 0 0 0 0 0 0  Down, Depressed, Hopeless 0 0 0 0 0 0 0  PHQ - 2 Score 0 0 0 0 0 0 0  Altered sleeping - - - 0 - - -  Tired, decreased energy - - - 0 - - -  Change in appetite - - - 0 - - -  Feeling bad or failure  about yourself  - - - 1 - - -  Trouble concentrating - - - 0 - - -  Moving slowly or fidgety/restless - - - 0 - - -  Suicidal thoughts - - - 0 - - -  PHQ-9 Score - - - 1 - - -  Difficult doing work/chores - - - - - - -     Review of Systems  Musculoskeletal:  Positive for back pain.       Leg pain  All other systems reviewed and are negative.     Objective:   Physical Exam Vitals and nursing note reviewed.  Constitutional:      Appearance: Normal appearance.  Cardiovascular:     Rate and Rhythm: Normal rate and regular rhythm.     Pulses: Normal pulses.     Heart sounds: Normal heart sounds.  Pulmonary:     Effort: Pulmonary effort is normal.     Breath sounds: Normal breath sounds.  Musculoskeletal:     Cervical back: Normal range of motion and neck supple.     Comments: Normal Muscle Bulk and Muscle Testing Reveals:  Upper Extremities: Full ROM and Muscle Strength  5/5 Lumbar Paraspinal Tenderness: L-3-L-5 Lower Extremities: Full ROM and Muscle Strength 5/5 Arises from Table slowly Narrow Based Gait     Skin:    General: Skin is warm and dry.  Neurological:     Mental Status: She is alert and oriented to person, place, and time.  Psychiatric:        Mood and Affect: Mood normal.        Behavior: Behavior normal.         Assessment & Plan:  1. Left Lumbar Radiculitis/ Chronic low back pain likely related to lumbar spondylosis and facet arthropathy: Continue HEP as Tolerated. 10/22/2020. 2. OA of Left Knee: .Refilled: Oxycodone 10/325 mg one tablet every 8 hours as needed #90. 10/22/2020 We will continue the opioid monitoring program, this consists of regular clinic visits, examinations, urine drug screen, pill counts as well as use of 10/24/2020 Controlled Substance Reporting system. A 12 month History has been reviewed on the [place] Controlled Substance Reporting System on 10/22/2020. 3. Muscle Spasms: Continue current medication regimen with Flexeril  as needed. 10/22/2020 4. Left Greater Trochanter Bursitis: Scheduled for  Left Hip Injection with Dr 10/24/2020. Continue to Alternate with  Heat and Ice Therapy. Continue to Monitor. 10/22/2020 5. Oxygen Desaturation: Refuses Ed or Urgentv Care Evaluation. Ms. Cocco and daughter will call Oxygen Supply company today. She will call in with oxygen reading, once she  home. She verbalizes understanding.    F/U in 1 month

## 2020-10-22 NOTE — Progress Notes (Signed)
[name] called back at the request of Ashley Houston, and reports that her oxygen level is 95%.

## 2020-10-23 DIAGNOSIS — J209 Acute bronchitis, unspecified: Secondary | ICD-10-CM | POA: Diagnosis not present

## 2020-10-23 DIAGNOSIS — F17210 Nicotine dependence, cigarettes, uncomplicated: Secondary | ICD-10-CM | POA: Diagnosis not present

## 2020-10-23 DIAGNOSIS — J0101 Acute recurrent maxillary sinusitis: Secondary | ICD-10-CM | POA: Diagnosis not present

## 2020-10-24 ENCOUNTER — Encounter: Payer: Self-pay | Admitting: Registered Nurse

## 2020-11-02 ENCOUNTER — Other Ambulatory Visit: Payer: Self-pay

## 2020-11-02 ENCOUNTER — Ambulatory Visit
Admission: RE | Admit: 2020-11-02 | Discharge: 2020-11-02 | Disposition: A | Discharge status: Home / Self Care | Payer: Medicare Other | Source: Ambulatory Visit | Attending: Registered Nurse | Admitting: Registered Nurse

## 2020-11-02 DIAGNOSIS — M5416 Radiculopathy, lumbar region: Secondary | ICD-10-CM

## 2020-11-02 DIAGNOSIS — M545 Low back pain, unspecified: Secondary | ICD-10-CM | POA: Diagnosis not present

## 2020-11-02 DIAGNOSIS — M48061 Spinal stenosis, lumbar region without neurogenic claudication: Secondary | ICD-10-CM | POA: Diagnosis not present

## 2020-11-07 ENCOUNTER — Telehealth: Payer: Self-pay | Admitting: Registered Nurse

## 2020-11-07 NOTE — Telephone Encounter (Signed)
Dr Riley Kill,  I wanted to review Ms. Radi MRI with you. I didn't get a chance. Can you review the MRI and see if it's okay for her to receive a Facet Injection with Dr Wynn Banker.  Thanks for your help.

## 2020-11-08 DIAGNOSIS — J441 Chronic obstructive pulmonary disease with (acute) exacerbation: Secondary | ICD-10-CM | POA: Diagnosis not present

## 2020-11-08 DIAGNOSIS — E7849 Other hyperlipidemia: Secondary | ICD-10-CM | POA: Diagnosis not present

## 2020-11-08 DIAGNOSIS — Z720 Tobacco use: Secondary | ICD-10-CM | POA: Diagnosis not present

## 2020-11-08 DIAGNOSIS — I10 Essential (primary) hypertension: Secondary | ICD-10-CM | POA: Diagnosis not present

## 2020-11-16 ENCOUNTER — Other Ambulatory Visit: Payer: Self-pay

## 2020-11-16 ENCOUNTER — Encounter: Payer: Medicare Other | Attending: Physical Medicine & Rehabilitation | Admitting: Physical Medicine and Rehabilitation

## 2020-11-16 VITALS — BP 130/64 | HR 76 | Temp 98.0°F | Wt 130.6 lb

## 2020-11-16 DIAGNOSIS — Z5181 Encounter for therapeutic drug level monitoring: Secondary | ICD-10-CM | POA: Diagnosis not present

## 2020-11-16 DIAGNOSIS — Z79891 Long term (current) use of opiate analgesic: Secondary | ICD-10-CM

## 2020-11-16 DIAGNOSIS — G894 Chronic pain syndrome: Secondary | ICD-10-CM

## 2020-11-16 NOTE — Progress Notes (Signed)
Subjective:    Patient ID: Ashley Houston, female    DOB: [birth year], 80 y.o.   MRN: [id number]  HPI: Ashley Houston is a 80 y.o. female who presents for left sided hip injection for greater trochanteric bursitis as well as bilateral L>R lower back pain. She had a recent MRI- reviewed with her and shows spondylosis, facet arthropathy, and abdominal aortic aneurysm. Pain is severe, 9/10, worse with extension, sleeping, walking. Her walking is severely limited because of this.     Pain Inventory Average Pain 9 Pain Right Now 3 My pain is dull and aching  In the last 24 hours, has pain interfered with the following? General activity 3 Relation with others 3 Enjoyment of life 3 What TIME of day is your pain at its worst? varies Sleep (in general) Good  Pain is worse with: walking, bending, sitting, standing, and some activites Pain improves with: heat/ice and medication Relief from Meds: 7  No family history on file. Social History   Socioeconomic History   Marital status: Widowed    Spouse name: Not on file   Number of children: Not on file   Years of education: Not on file   Highest education level: Not on file  Occupational History   Not on file  Tobacco Use   Smoking status: Every Day    Packs/day: 0.50    Pack years: 0.00    Types: Cigarettes   Smokeless tobacco: Never  Vaping Use   Vaping Use: Former  Substance and Sexual Activity   Alcohol use: No   Drug use: No   Sexual activity: Not on file  Other Topics Concern   Not on file  Social History Narrative   Not on file   Social Determinants of Health   Financial Resource Strain: Not on file  Food Insecurity: Not on file  Transportation Needs: Not on file  Physical Activity: Not on file  Stress: Not on file  Social Connections: Not on file   Past Surgical History:  Procedure Laterality Date   ABDOMINAL HYSTERECTOMY     CHOLECYSTECTOMY     TONSILLECTOMY     Past Surgical History:  Procedure  Laterality Date   ABDOMINAL HYSTERECTOMY     CHOLECYSTECTOMY     TONSILLECTOMY     Past Medical History:  Diagnosis Date   Anxiety    Chronic diastolic heart failure (HCC)    Chronic pain    hips back and shoulders   COPD (chronic obstructive pulmonary disease) (HCC)    Depression    Hypothyroidism 09/08/2015   Tachycardia    Thyroid disease    BP 130/64 (BP Location: Right Arm, Patient Position: Sitting)   Pulse 76   Temp 98 F (36.7 C) (Oral)   Wt 130 lb 9.6 oz (59.2 kg)   SpO2 91%   BMI 21.08 kg/m   Opioid Risk Score:   Fall Risk Score:  `1  Depression screen PHQ 2/9  Depression screen [facility] 2/9 06/27/2020 04/25/2020 01/04/2020 11/02/2019 04/01/2018 03/01/2018 02/01/2018  Decreased Interest 0 0 0 0 0 0 0  Down, Depressed, Hopeless 0 0 0 0 0 0 0  PHQ - 2 Score 0 0 0 0 0 0 0  Altered sleeping - - - 0 - - -  Tired, decreased energy - - - 0 - - -  Change in appetite - - - 0 - - -  Feeling bad or failure about yourself  - - - 1 - - -  Trouble  concentrating - - - 0 - - -  Moving slowly or fidgety/restless - - - 0 - - -  Suicidal thoughts - - - 0 - - -  PHQ-9 Score - - - 1 - - -  Difficult doing work/chores - - - - - - -     Review of Systems  Musculoskeletal:  Positive for back pain.       Leg pain  All other systems reviewed and are negative.     Objective:   Physical Exam Vitals and nursing note reviewed.  Constitutional:      Appearance: Normal appearance.  Cardiovascular:     Rate and Rhythm: Normal rate and regular rhythm.     Pulses: Normal pulses.     Heart sounds: Normal heart sounds.  Pulmonary:     Effort: Pulmonary effort is normal.     Breath sounds: Normal breath sounds.  Musculoskeletal:     Cervical back: Normal range of motion and neck supple.     Comments: Normal Muscle Bulk and Muscle Testing Reveals:  Upper Extremities: Full ROM and Muscle Strength  5/5 Lumbar Paraspinal Tenderness: L-3-L-5 Lower Extremities: Full ROM and Muscle Strength  5/5 Arises from Table slowly Narrow Based Gait  +Kemp's test bilaterally L>R. Skin:    General: Skin is warm and dry.  Neurological:     Mental Status: She is alert and oriented to person, place, and time.  Psychiatric:        Mood and Affect: Mood normal.        Behavior: Behavior normal.        Assessment & Plan:  1. Left Lumbar Radiculitis/ Chronic low back pain likely related to lumbar spondylosis and facet arthropathy: Continue HEP as Tolerated. 10/22/2020. Severely impacts her quality of life and ability to walk. Referred to Dr. Wynn Banker for MBB in August- she has never had before.  2. OA of Left Knee: .Refilled: Oxycodone 10/325 mg one tablet every 8 hours as needed #90. 10/22/2020 We will continue the opioid monitoring program, this consists of regular clinic visits, examinations, urine drug screen, pill counts as well as use of [place] Controlled Substance Reporting system. A 12 month History has been reviewed on the [place] Controlled Substance Reporting System on 10/22/2020. 3. Muscle Spasms: Continue current medication regimen with Flexeril as needed. 10/22/2020 4. Left Greater Trochanter Bursitis: Trochanteric bursa injection performed today  Indication Trochanteric bursitis. Exam has tenderness over the greater trochanter of the hip. Pain has not responded to conservative care such as exercise therapy and oral medications. Pain interferes with sleep or with mobility Informed consent was obtained after describing risks and benefits of the procedure with the patient these include bleeding bruising and infection. Patient has signed written consent form. Patient placed in a lateral decubitus position with the affected hip superior. Point of maximal pain was palpated marked and prepped with Betadine and entered with a needle to bone contact. Needle slightly withdrawn then 9mL of celestone with 4 cc 1% lidocaine were injected. Patient tolerated procedure well. Post  procedure instructions given.   Continue to Alternate with  Heat and Ice Therapy. Continue to Monitor. 10/22/2020   F/U w/ Dr. Wynn Banker in August

## 2020-11-19 ENCOUNTER — Telehealth: Payer: Self-pay

## 2020-11-19 NOTE — Telephone Encounter (Addendum)
Ashley Houston called to report she was not able to do the UDS on her last office visit date. Patient was not able to void and her daughter had to go to work.   [name] NP had been informed. Per Ashley Houston she can go today 11/19/2020. (Patient has been informed).   She wanted to know make sure Dr. Carlis Abbott will send in her next refill?  Today she has #42 pills on hand.   Rx: Oxycodone 10/325 MG.

## 2020-11-20 ENCOUNTER — Other Ambulatory Visit: Payer: Self-pay | Admitting: Physical Medicine and Rehabilitation

## 2020-11-20 MED ORDER — OXYCODONE-ACETAMINOPHEN 10-325 MG PO TABS: 1.0000 | ORAL_TABLET | Freq: Four times a day (QID) | ORAL | 0 refills | Status: DC | PRN

## 2020-11-21 NOTE — Telephone Encounter (Signed)
Patient informed. 

## 2020-11-22 NOTE — Telephone Encounter (Signed)
Will you please try to close this encounter? If you already sent in the Rx.

## 2020-11-22 NOTE — Telephone Encounter (Signed)
done

## 2020-11-23 LAB — TOXASSURE SELECT,+ANTIDEPR,UR

## 2020-11-29 ENCOUNTER — Telehealth: Payer: Self-pay

## 2020-11-29 MED ORDER — OXYCODONE-ACETAMINOPHEN 10-325 MG PO TABS: 1.0000 | ORAL_TABLET | Freq: Three times a day (TID) | ORAL | 0 refills | Status: DC | PRN

## 2020-11-29 NOTE — Telephone Encounter (Signed)
Pharmacy already delivered #20 of Oxycodone/APAP to her house. Patient has 10 days left of medication and next appt is 01/01/2021

## 2020-11-29 NOTE — Telephone Encounter (Signed)
PMP was Reviewed> Oxycodone e-scribed today with a post date  

## 2020-11-30 ENCOUNTER — Telehealth: Payer: Self-pay | Admitting: *Deleted

## 2020-11-30 NOTE — Telephone Encounter (Signed)
Urine drug screen for this encounter is consistent for prescribed medication 

## 2020-12-20 DIAGNOSIS — Z6822 Body mass index (BMI) 22.0-22.9, adult: Secondary | ICD-10-CM | POA: Diagnosis not present

## 2020-12-20 DIAGNOSIS — F411 Generalized anxiety disorder: Secondary | ICD-10-CM | POA: Diagnosis not present

## 2020-12-20 DIAGNOSIS — J0101 Acute recurrent maxillary sinusitis: Secondary | ICD-10-CM | POA: Diagnosis not present

## 2020-12-20 DIAGNOSIS — R300 Dysuria: Secondary | ICD-10-CM | POA: Diagnosis not present

## 2020-12-21 DIAGNOSIS — R300 Dysuria: Secondary | ICD-10-CM | POA: Diagnosis not present

## 2020-12-27 DIAGNOSIS — I350 Nonrheumatic aortic (valve) stenosis: Secondary | ICD-10-CM | POA: Diagnosis not present

## 2020-12-27 DIAGNOSIS — J449 Chronic obstructive pulmonary disease, unspecified: Secondary | ICD-10-CM | POA: Diagnosis not present

## 2020-12-27 DIAGNOSIS — R011 Cardiac murmur, unspecified: Secondary | ICD-10-CM | POA: Diagnosis not present

## 2020-12-27 DIAGNOSIS — Z0001 Encounter for general adult medical examination with abnormal findings: Secondary | ICD-10-CM | POA: Diagnosis not present

## 2021-01-01 ENCOUNTER — Encounter: Payer: Medicare Other | Admitting: Physical Medicine & Rehabilitation

## 2021-01-02 ENCOUNTER — Ambulatory Visit: Payer: Medicare Other | Admitting: Registered Nurse

## 2021-01-04 ENCOUNTER — Telehealth: Payer: Self-pay

## 2021-01-04 MED ORDER — OXYCODONE-ACETAMINOPHEN 10-325 MG PO TABS: 1.0000 | ORAL_TABLET | Freq: Three times a day (TID) | ORAL | 0 refills | Status: DC | PRN

## 2021-01-04 NOTE — Telephone Encounter (Signed)
Pt called stating she will be out of her oxycodone on Wednesday , per pmp last filled was 12/04/2020 . She has upcoming appt with you September 20th . She missed her appt with dr.kirsteins on August 23rd she couldn't make it to her appt that's why she will be running out of medication soon .

## 2021-01-04 NOTE — Telephone Encounter (Signed)
PMP was Reviewed.  Oxycodone e-scribed today.  Placed a call to Ms. Kirchman regarding the above. She verbalizes understanding.

## 2021-01-09 DIAGNOSIS — J441 Chronic obstructive pulmonary disease with (acute) exacerbation: Secondary | ICD-10-CM | POA: Diagnosis not present

## 2021-01-09 DIAGNOSIS — I10 Essential (primary) hypertension: Secondary | ICD-10-CM | POA: Diagnosis not present

## 2021-01-09 DIAGNOSIS — Z720 Tobacco use: Secondary | ICD-10-CM | POA: Diagnosis not present

## 2021-01-09 DIAGNOSIS — E7849 Other hyperlipidemia: Secondary | ICD-10-CM | POA: Diagnosis not present

## 2021-01-29 ENCOUNTER — Other Ambulatory Visit: Payer: Self-pay

## 2021-01-29 ENCOUNTER — Encounter: Payer: Self-pay | Admitting: Registered Nurse

## 2021-01-29 ENCOUNTER — Encounter: Payer: Medicare Other | Attending: Physical Medicine & Rehabilitation | Admitting: Registered Nurse

## 2021-01-29 VITALS — BP 112/65 | HR 80 | Temp 98.2°F | Ht 66.0 in | Wt 131.6 lb

## 2021-01-29 DIAGNOSIS — M5136 Other intervertebral disc degeneration, lumbar region: Secondary | ICD-10-CM

## 2021-01-29 DIAGNOSIS — G894 Chronic pain syndrome: Secondary | ICD-10-CM | POA: Diagnosis not present

## 2021-01-29 DIAGNOSIS — M5416 Radiculopathy, lumbar region: Secondary | ICD-10-CM | POA: Diagnosis not present

## 2021-01-29 DIAGNOSIS — M7062 Trochanteric bursitis, left hip: Secondary | ICD-10-CM

## 2021-01-29 DIAGNOSIS — Z5181 Encounter for therapeutic drug level monitoring: Secondary | ICD-10-CM | POA: Diagnosis not present

## 2021-01-29 DIAGNOSIS — Z79891 Long term (current) use of opiate analgesic: Secondary | ICD-10-CM

## 2021-01-29 DIAGNOSIS — R0902 Hypoxemia: Secondary | ICD-10-CM | POA: Diagnosis not present

## 2021-01-29 MED ORDER — OXYCODONE-ACETAMINOPHEN 10-325 MG PO TABS: 1.0000 | ORAL_TABLET | Freq: Three times a day (TID) | ORAL | 0 refills | Status: DC | PRN

## 2021-01-29 NOTE — Patient Instructions (Signed)
Call or send a My Chart Message on 10/20- 03/04/2021, Regarding your medication.

## 2021-01-29 NOTE — Progress Notes (Signed)
Subjective:    Patient ID: Ashley Houston, female    DOB: [birth year], 80 y.o.   MRN: [id number]  HPI: Ashley Houston is a 80 y.o. female who returns for follow up appointment for chronic pain and medication refill. She states her pain is located in her lower back radiating into her left hip. She rates her pain 0 at this time. Her current exercise regime is walking and performing stretching exercises.  Ashley Houston equivalent is 45.00  MME.She is also prescribed Lorazepam by [name]. We have discussed the black box warning of using opioids and benzodiazepines. I highlighted the dangers of using these drugs together and discussed the adverse events including respiratory suppression, overdose, cognitive impairment and importance of compliance with current regimen. We will continue to monitor and adjust as indicated.      Last UDS was Performed on 11/20/2020, it was consistent     Pain Inventory Average Pain 8 Pain Right Now 0 My pain is dull and aching  In the last 24 hours, has pain interfered with the following? General activity 0 Relation with others 0 Enjoyment of life 0 What TIME of day is your pain at its worst? morning , daytime, and evening Sleep (in general) Good  Pain is worse with: walking, bending, sitting, and standing Pain improves with: rest, heat/ice, and medication Relief from Meds: 8  No family history on file. Social History   Socioeconomic History   Marital status: Widowed    Spouse name: Not on file   Number of children: Not on file   Years of education: Not on file   Highest education level: Not on file  Occupational History   Not on file  Tobacco Use   Smoking status: Every Day    Packs/day: 0.50    Types: Cigarettes   Smokeless tobacco: Never  Vaping Use   Vaping Use: Former  Substance and Sexual Activity   Alcohol use: No   Drug use: No   Sexual activity: Not on file  Other Topics Concern   Not on file  Social History  Narrative   Not on file   Social Determinants of Health   Financial Resource Strain: Not on file  Food Insecurity: Not on file  Transportation Needs: Not on file  Physical Activity: Not on file  Stress: Not on file  Social Connections: Not on file   Past Surgical History:  Procedure Laterality Date   ABDOMINAL HYSTERECTOMY     CHOLECYSTECTOMY     TONSILLECTOMY     Past Surgical History:  Procedure Laterality Date   ABDOMINAL HYSTERECTOMY     CHOLECYSTECTOMY     TONSILLECTOMY     Past Medical History:  Diagnosis Date   Anxiety    Chronic diastolic heart failure (HCC)    Chronic pain    hips back and shoulders   COPD (chronic obstructive pulmonary disease) (HCC)    Depression    Hypothyroidism 09/08/2015   Tachycardia    Thyroid disease    There were no vitals taken for this visit.  Opioid Risk Score:   Fall Risk Score:  `1  Depression screen PHQ 2/9  Depression screen [facility] 2/9 01/29/2021 06/27/2020 04/25/2020 01/04/2020 11/02/2019 04/01/2018 03/01/2018  Decreased Interest 0 0 0 0 0 0 0  Down, Depressed, Hopeless 0 0 0 0 0 0 0  PHQ - 2 Score 0 0 0 0 0 0 0  Altered sleeping - - - - 0 - -  Tired, decreased  energy - - - - 0 - -  Change in appetite - - - - 0 - -  Feeling bad or failure about yourself  - - - - 1 - -  Trouble concentrating - - - - 0 - -  Moving slowly or fidgety/restless - - - - 0 - -  Suicidal thoughts - - - - 0 - -  PHQ-9 Score - - - - 1 - -  Difficult doing work/chores - - - - - - -     Review of Systems  Constitutional: Negative.   HENT: Negative.    Eyes: Negative.   Respiratory: Negative.    Cardiovascular: Negative.   Gastrointestinal: Negative.   Endocrine: Negative.   Genitourinary: Negative.   Musculoskeletal:  Positive for back pain.  Skin: Negative.   Allergic/Immunologic: Negative.   Neurological: Negative.   Hematological: Negative.   Psychiatric/Behavioral: Negative.    All other systems reviewed and are negative.      Objective:   Physical Exam Vitals and nursing note reviewed.  Constitutional:      Appearance: Normal appearance.  Cardiovascular:     Rate and Rhythm: Normal rate and regular rhythm.     Pulses: Normal pulses.     Heart sounds: Normal heart sounds.  Pulmonary:     Effort: Pulmonary effort is normal.     Breath sounds: Normal breath sounds.  Musculoskeletal:     Cervical back: Normal range of motion and neck supple.     Comments: Normal Muscle Bulk and Muscle Testing Reveals:  Upper Extremities: Right: Full ROM and Muscle Strength 5/5 Left Upper Extremity: Decreased ROM 90 Degrees  and Muscle Strength 4/5 Lumbar Paraspinal Tenderness: L-4-L-5 Left Greater Trochanter Tenderness Lower Extremities: Full ROM and Muscle Strength 5/5 Arises from Table with ease Narrow Based  Gait     Skin:    General: Skin is warm and dry.  Neurological:     Mental Status: She is alert and oriented to person, place, and time.  Psychiatric:        Mood and Affect: Mood normal.        Behavior: Behavior normal.         Assessment & Plan:  1. Left Lumbar Radiculitis/ Chronic low back pain likely related to lumbar spondylosis and facet arthropathy: Continue HEP as Tolerated. 01/29/2021. 2. OA of Left Knee: .Refilled: Oxycodone 10/325 mg one tablet every 8 hours as needed #90. 01/29/2021 We will continue the opioid monitoring program, this consists of regular clinic visits, examinations, urine drug screen, pill counts as well as use of [place] Controlled Substance Reporting system. A 12 month History has been reviewed on the [place] Controlled Substance Reporting System on 01/29/2021. 3. Muscle Spasms: Continue current medication regimen with Flexeril as needed. 01/29/2021 4. Left Greater Trochanter Bursitis: Scheduled for  Left Hip Injection with Dr Carlis Abbott. Continue to Alternate with  Heat and Ice Therapy. Continue to Monitor. 01/29/2021 5. Oxygen Desaturation: She denied SOB. Refuses  Ed or Urgent Care Evaluation. Ashley Houston states the oxygen reading is low due to her acrylic nails, her daughter in the room.  We will continue to monitor.    F/U in 1 month

## 2021-02-21 ENCOUNTER — Encounter: Payer: Medicare Other | Admitting: Physical Medicine and Rehabilitation

## 2021-03-01 ENCOUNTER — Telehealth: Payer: Self-pay

## 2021-03-01 MED ORDER — OXYCODONE-ACETAMINOPHEN 10-325 MG PO TABS: 1.0000 | ORAL_TABLET | Freq: Three times a day (TID) | ORAL | 0 refills | Status: DC | PRN

## 2021-03-01 NOTE — Telephone Encounter (Signed)
Patient called stating Ashley Houston asked her to call and to get medication refill. Patient next appt is 03/27/21 and last prescription was on 01/29/21.

## 2021-03-01 NOTE — Telephone Encounter (Signed)
PMP was Reviewed, Note was Reviewed.  Oxycodone e-scribed . Ms. Waln will be called regarding the above.

## 2021-03-27 ENCOUNTER — Other Ambulatory Visit: Payer: Self-pay

## 2021-03-27 ENCOUNTER — Encounter: Payer: Self-pay | Admitting: Registered Nurse

## 2021-03-27 ENCOUNTER — Encounter: Payer: Medicare Other | Attending: Physical Medicine & Rehabilitation | Admitting: Registered Nurse

## 2021-03-27 VITALS — BP 146/80 | HR 84 | Temp 98.3°F | Wt 132.2 lb

## 2021-03-27 DIAGNOSIS — Z79891 Long term (current) use of opiate analgesic: Secondary | ICD-10-CM | POA: Diagnosis not present

## 2021-03-27 DIAGNOSIS — G894 Chronic pain syndrome: Secondary | ICD-10-CM | POA: Insufficient documentation

## 2021-03-27 DIAGNOSIS — Z5181 Encounter for therapeutic drug level monitoring: Secondary | ICD-10-CM | POA: Insufficient documentation

## 2021-03-27 DIAGNOSIS — R0902 Hypoxemia: Secondary | ICD-10-CM | POA: Diagnosis not present

## 2021-03-27 DIAGNOSIS — M5136 Other intervertebral disc degeneration, lumbar region: Secondary | ICD-10-CM | POA: Diagnosis not present

## 2021-03-27 DIAGNOSIS — M5416 Radiculopathy, lumbar region: Secondary | ICD-10-CM | POA: Insufficient documentation

## 2021-03-27 DIAGNOSIS — M7062 Trochanteric bursitis, left hip: Secondary | ICD-10-CM | POA: Diagnosis not present

## 2021-03-27 MED ORDER — OXYCODONE-ACETAMINOPHEN 10-325 MG PO TABS: 1.0000 | ORAL_TABLET | Freq: Three times a day (TID) | ORAL | 0 refills | Status: DC | PRN

## 2021-03-27 NOTE — Progress Notes (Signed)
Subjective:    Patient ID: Ashley Houston, female    DOB: [birth year], 80 y.o.   MRN: [id number]  HPI: Ashley Houston is a 80 y.o. female who returns for follow up appointment for chronic pain and medication refill. She states her pain was located in her lower back radiating into her left hip this morning. She rates her pain at this time 0. Her current exercise regime is walking and performing stretching exercises.  Ms. Moffat arrived to office with oxygen desaturation, she states she is unable to carry the oxygen tanks, she is in the process of trying to get a small portable tank. Oxygen saturation was re-checked, she refuses ED evaluation. She has a concentrator at home she states, she was instructed to go straight home and to put her oxygen on, she and her daughter verbalizes understanding.   Ms. Nestler and her daughter was asked to check her oxygen saturation when she get home , after she has her oxygen on and to call in with oxygen saturation number. They verbalize understanding.    Ms. Point Morphine equivalent is 45.00 MME. She is also prescribed Lorazepam  by [name] .We have discussed the black box warning of using opioids and benzodiazepines. I highlighted the dangers of using these drugs together and discussed the adverse events including respiratory suppression, overdose, cognitive impairment and importance of compliance with current regimen. We will continue to monitor and adjust as indicated.    Oral Swab was Performed today.    Pain Inventory Average Pain 8 Pain Right Now 0 My pain is constant, dull, and aching  In the last 24 hours, has pain interfered with the following? General activity 0 Relation with others 0 Enjoyment of life 0 What TIME of day is your pain at its worst? morning  and evening Sleep (in general) Good  Pain is worse with: walking and bending Pain improves with: medication and heat Relief from Meds: 7  No family history on  file. Social History   Socioeconomic History   Marital status: Widowed    Spouse name: Not on file   Number of children: Not on file   Years of education: Not on file   Highest education level: Not on file  Occupational History   Not on file  Tobacco Use   Smoking status: Every Day    Packs/day: 0.50    Types: Cigarettes   Smokeless tobacco: Never  Vaping Use   Vaping Use: Former  Substance and Sexual Activity   Alcohol use: No   Drug use: No   Sexual activity: Not on file  Other Topics Concern   Not on file  Social History Narrative   Not on file   Social Determinants of Health   Financial Resource Strain: Not on file  Food Insecurity: Not on file  Transportation Needs: Not on file  Physical Activity: Not on file  Stress: Not on file  Social Connections: Not on file   Past Surgical History:  Procedure Laterality Date   ABDOMINAL HYSTERECTOMY     CHOLECYSTECTOMY     TONSILLECTOMY     Past Surgical History:  Procedure Laterality Date   ABDOMINAL HYSTERECTOMY     CHOLECYSTECTOMY     TONSILLECTOMY     Past Medical History:  Diagnosis Date   Anxiety    Chronic diastolic heart failure (HCC)    Chronic pain    hips back and shoulders   COPD (chronic obstructive pulmonary disease) (HCC)    Depression  Hypothyroidism 09/08/2015   Tachycardia    Thyroid disease    There were no vitals taken for this visit.  Opioid Risk Score:   Fall Risk Score:  `1  Depression screen PHQ 2/9  Depression screen [facility] 2/9 01/29/2021 06/27/2020 04/25/2020 01/04/2020 11/02/2019 04/01/2018 03/01/2018  Decreased Interest 0 0 0 0 0 0 0  Down, Depressed, Hopeless 0 0 0 0 0 0 0  PHQ - 2 Score 0 0 0 0 0 0 0  Altered sleeping - - - - 0 - -  Tired, decreased energy - - - - 0 - -  Change in appetite - - - - 0 - -  Feeling bad or failure about yourself  - - - - 1 - -  Trouble concentrating - - - - 0 - -  Moving slowly or fidgety/restless - - - - 0 - -  Suicidal thoughts - - - - 0 - -   PHQ-9 Score - - - - 1 - -  Difficult doing work/chores - - - - - - -    Review of Systems  Musculoskeletal:  Positive for back pain and gait problem.       Right knee, left hip  All other systems reviewed and are negative.     Objective:   Physical Exam Vitals and nursing note reviewed.  Constitutional:      Appearance: Normal appearance.  Cardiovascular:     Rate and Rhythm: Normal rate and regular rhythm.     Pulses: Normal pulses.     Heart sounds: Normal heart sounds.  Pulmonary:     Effort: Pulmonary effort is normal.     Breath sounds: Normal breath sounds.  Musculoskeletal:     Cervical back: Normal range of motion and neck supple.     Comments: Normal Muscle Bulk and Muscle Testing Reveals:  Upper Extremities: Full ROM and Muscle Strength 5/5 Lumbar Paraspinal Tenderness: L-4-L-5 Left Greater Trochanter Tenderness Lower Extremities: Full ROM and Muscle Strength 5/5 Arises from Table Slowly Narrow Based  Gait     Skin:    General: Skin is warm and dry.  Neurological:     Mental Status: She is alert and oriented to person, place, and time.  Psychiatric:        Mood and Affect: Mood normal.        Behavior: Behavior normal.         Assessment & Plan:  1. Left Lumbar Radiculitis/ Chronic low back pain likely related to lumbar spondylosis and facet arthropathy: Continue HEP as Tolerated. 03/27/2021. 2. OA of Left Knee: .Refilled: Oxycodone 10/325 mg one tablet every 8 hours as needed #90. Second script given for the following month, to give her time to purchase portable oxygen tank. 03/27/2021 We will continue the opioid monitoring program, this consists of regular clinic visits, examinations, urine drug screen, pill counts as well as use of [place] Controlled Substance Reporting system. A 12 month History has been reviewed on the [place] Controlled Substance Reporting System on 03/27/2021. 3. Muscle Spasms: Continue current medication regimen with  Flexeril as needed. 03/27/2021 4. Left Greater Trochanter Bursitis: Scheduled for  Left Hip Injection with Dr Carlis Abbott. Continue to Alternate with  Heat and Ice Therapy. Continue to Monitor. 03/27/2021 5. Oxygen Desaturation: She denied SOB. Refuses Ed or Urgent Care Evaluation. Ms. Myren states she is in the process of trying to buy portable light weight oxygen tank, her daughter in the room.  See HPI. We will  continue to monitor.    F/U in 2 months

## 2021-03-29 ENCOUNTER — Telehealth: Payer: Self-pay

## 2021-03-29 NOTE — Telephone Encounter (Signed)
Patient called to inform [name], NP that her daughter's pulse oximeter is not working so she is getting one delivered today. The people for the portable oxygen told her that her PCP can send a prescription for it and she is scheduled to see her on Wednesday 04/02/21.

## 2021-04-01 LAB — DRUG TOX MONITOR 1 W/CONF, ORAL FLD
Alprazolam: NEGATIVE ng/mL (ref <0.50)
Amphetamines: NEGATIVE ng/mL (ref <10)
Barbiturates: NEGATIVE ng/mL (ref <10)
Benzodiazepines: POSITIVE ng/mL — AB (ref <0.50)
Buprenorphine: NEGATIVE ng/mL (ref <0.10)
Chlordiazepoxide: NEGATIVE ng/mL (ref <0.50)
Clonazepam: NEGATIVE ng/mL (ref <0.50)
Cocaine: NEGATIVE ng/mL (ref <5.0)
Codeine: NEGATIVE ng/mL (ref <2.5)
Cotinine: 67.1 ng/mL — ABNORMAL HIGH (ref <5.0)
Diazepam: NEGATIVE ng/mL (ref <0.50)
Dihydrocodeine: NEGATIVE ng/mL (ref <2.5)
Fentanyl: NEGATIVE ng/mL (ref <0.10)
Flunitrazepam: NEGATIVE ng/mL (ref <0.50)
Flurazepam: NEGATIVE ng/mL (ref <0.50)
Heroin Metabolite: NEGATIVE ng/mL (ref <1.0)
Hydrocodone: NEGATIVE ng/mL (ref <2.5)
Hydromorphone: NEGATIVE ng/mL (ref <2.5)
Lorazepam: >25 ng/mL — ABNORMAL HIGH (ref <0.50)
MARIJUANA: NEGATIVE ng/mL (ref <2.5)
MDMA: NEGATIVE ng/mL (ref <10)
Meprobamate: NEGATIVE ng/mL (ref <2.5)
Methadone: NEGATIVE ng/mL (ref <5.0)
Midazolam: NEGATIVE ng/mL (ref <0.50)
Morphine: NEGATIVE ng/mL (ref <2.5)
Nicotine Metabolite: POSITIVE ng/mL — AB (ref <5.0)
Nordiazepam: NEGATIVE ng/mL (ref <0.50)
Norhydrocodone: NEGATIVE ng/mL (ref <2.5)
Noroxycodone: >250 ng/mL — ABNORMAL HIGH (ref <2.5)
Opiates: POSITIVE ng/mL — AB (ref <2.5)
Oxazepam: NEGATIVE ng/mL (ref <0.50)
Oxycodone: >250 ng/mL — ABNORMAL HIGH (ref <2.5)
Phencyclidine: NEGATIVE ng/mL (ref <10)
Tapentadol: NEGATIVE ng/mL (ref <5.0)
Temazepam: NEGATIVE ng/mL (ref <0.50)
Triazolam: NEGATIVE ng/mL (ref <0.50)
Zolpidem: NEGATIVE ng/mL (ref <5.0)

## 2021-04-01 LAB — DRUG TOX ALC METAB W/CON, ORAL FLD: Alcohol Metabolite: NEGATIVE ng/mL (ref <25)

## 2021-04-02 ENCOUNTER — Telehealth: Payer: Self-pay | Admitting: *Deleted

## 2021-04-02 NOTE — Telephone Encounter (Signed)
Oral swab drug screen was consistent for prescribed medications.  ?

## 2021-04-10 DIAGNOSIS — J449 Chronic obstructive pulmonary disease, unspecified: Secondary | ICD-10-CM | POA: Diagnosis not present

## 2021-04-10 DIAGNOSIS — R011 Cardiac murmur, unspecified: Secondary | ICD-10-CM | POA: Diagnosis not present

## 2021-04-10 DIAGNOSIS — R531 Weakness: Secondary | ICD-10-CM | POA: Diagnosis not present

## 2021-04-10 DIAGNOSIS — F411 Generalized anxiety disorder: Secondary | ICD-10-CM | POA: Diagnosis not present

## 2021-05-01 ENCOUNTER — Ambulatory Visit: Payer: Medicare Other | Admitting: Registered Nurse

## 2021-05-29 ENCOUNTER — Ambulatory Visit: Payer: Medicare Other | Admitting: Physical Medicine and Rehabilitation

## 2021-06-04 ENCOUNTER — Other Ambulatory Visit: Payer: Self-pay

## 2021-06-04 ENCOUNTER — Telehealth: Payer: Self-pay | Admitting: *Deleted

## 2021-06-04 ENCOUNTER — Encounter: Payer: Medicare Other | Attending: Physical Medicine & Rehabilitation | Admitting: Physical Medicine and Rehabilitation

## 2021-06-04 VITALS — BP 102/55 | HR 74 | Ht 66.0 in | Wt 133.6 lb

## 2021-06-04 DIAGNOSIS — M7062 Trochanteric bursitis, left hip: Secondary | ICD-10-CM | POA: Diagnosis not present

## 2021-06-04 MED ORDER — OXYCODONE-ACETAMINOPHEN 10-325 MG PO TABS: 1.0000 | ORAL_TABLET | Freq: Three times a day (TID) | ORAL | 0 refills | Status: DC | PRN

## 2021-06-04 NOTE — Telephone Encounter (Signed)
Mrs Daughety called for Ashley Houston to send her medication in to [facility]. She said she could not wait on her this morning because she was with her daughter.

## 2021-06-04 NOTE — Progress Notes (Signed)
Trochanteric bursa injection, left.   Indication Trochanteric bursitis. Exam has tenderness over the greater trochanter of the hip. Pain has not responded to conservative care such as exercise therapy and oral medications. Pain interferes with sleep or with mobility Informed consent was obtained after describing risks and benefits of the procedure with the patient these include bleeding bruising and infection. Patient has signed written consent form. Patient placed in a lateral decubitus position with the affected hip superior. Point of maximal pain was palpated marked and prepped with Betadine and entered with a needle to bone contact. Needle slightly withdrawn then 6mg  of betamethasone with 4 cc 1% lidocaine were injected. Patient tolerated procedure well. Post procedure instructions given.

## 2021-06-04 NOTE — Telephone Encounter (Signed)
PMP was Reviewed.  Oxycodone e-scribed today.  Ashley Houston is aware of the above.

## 2021-06-06 ENCOUNTER — Telehealth: Payer: Self-pay | Admitting: Registered Nurse

## 2021-06-06 NOTE — Telephone Encounter (Signed)
Patient was in office the other day and her oxycodone wasn't refilled.  Patient will be out of medication.  Please call patient.

## 2021-06-06 NOTE — Telephone Encounter (Signed)
Return Ms. Hockey call,  Her prescription was sent on 06/04/2021. She verbalizes understanding.  She also stated her daughter passed away, emotional support given.

## 2021-06-11 DIAGNOSIS — E7849 Other hyperlipidemia: Secondary | ICD-10-CM | POA: Diagnosis not present

## 2021-06-11 DIAGNOSIS — R0902 Hypoxemia: Secondary | ICD-10-CM | POA: Diagnosis not present

## 2021-06-11 DIAGNOSIS — I10 Essential (primary) hypertension: Secondary | ICD-10-CM | POA: Diagnosis not present

## 2021-06-11 DIAGNOSIS — I350 Nonrheumatic aortic (valve) stenosis: Secondary | ICD-10-CM | POA: Diagnosis not present

## 2021-07-01 ENCOUNTER — Telehealth: Payer: Self-pay | Admitting: Registered Nurse

## 2021-07-01 MED ORDER — OXYCODONE-ACETAMINOPHEN 10-325 MG PO TABS: 1.0000 | ORAL_TABLET | Freq: Three times a day (TID) | ORAL | 0 refills | Status: DC | PRN

## 2021-07-01 NOTE — Telephone Encounter (Signed)
Ashley Houston is unable to come to appt due to death in the family. Please send in her medication.

## 2021-07-01 NOTE — Telephone Encounter (Signed)
Placed a call to Ashley Houston,  Emotional support given, PMP was reviewed Oxycodone e-scribed. She verbalizes understanding.

## 2021-07-03 ENCOUNTER — Encounter: Payer: Medicare Other | Admitting: Registered Nurse

## 2021-07-21 ENCOUNTER — Encounter (HOSPITAL_COMMUNITY): Payer: Self-pay | Admitting: Emergency Medicine

## 2021-07-21 ENCOUNTER — Inpatient Hospital Stay (HOSPITAL_COMMUNITY)
Admission: EM | Admit: 2021-07-21 | Discharge: 2021-07-24 | DRG: 193 | Disposition: A | Discharge status: Home / Self Care | Payer: Medicare Other | Attending: Family Medicine | Admitting: Family Medicine

## 2021-07-21 ENCOUNTER — Emergency Department (HOSPITAL_COMMUNITY): Payer: Medicare Other

## 2021-07-21 ENCOUNTER — Other Ambulatory Visit: Payer: Self-pay

## 2021-07-21 DIAGNOSIS — Z91199 Patient's noncompliance with other medical treatment and regimen due to unspecified reason: Secondary | ICD-10-CM | POA: Diagnosis not present

## 2021-07-21 DIAGNOSIS — N179 Acute kidney failure, unspecified: Secondary | ICD-10-CM | POA: Diagnosis not present

## 2021-07-21 DIAGNOSIS — Z79899 Other long term (current) drug therapy: Secondary | ICD-10-CM | POA: Diagnosis not present

## 2021-07-21 DIAGNOSIS — E039 Hypothyroidism, unspecified: Secondary | ICD-10-CM | POA: Diagnosis not present

## 2021-07-21 DIAGNOSIS — Z79890 Hormone replacement therapy (postmenopausal): Secondary | ICD-10-CM | POA: Diagnosis not present

## 2021-07-21 DIAGNOSIS — Z720 Tobacco use: Secondary | ICD-10-CM | POA: Diagnosis not present

## 2021-07-21 DIAGNOSIS — R0602 Shortness of breath: Secondary | ICD-10-CM | POA: Diagnosis not present

## 2021-07-21 DIAGNOSIS — E782 Mixed hyperlipidemia: Secondary | ICD-10-CM | POA: Diagnosis not present

## 2021-07-21 DIAGNOSIS — J441 Chronic obstructive pulmonary disease with (acute) exacerbation: Secondary | ICD-10-CM | POA: Diagnosis present

## 2021-07-21 DIAGNOSIS — Z9981 Dependence on supplemental oxygen: Secondary | ICD-10-CM

## 2021-07-21 DIAGNOSIS — J439 Emphysema, unspecified: Secondary | ICD-10-CM | POA: Diagnosis present

## 2021-07-21 DIAGNOSIS — I517 Cardiomegaly: Secondary | ICD-10-CM | POA: Diagnosis not present

## 2021-07-21 DIAGNOSIS — R7989 Other specified abnormal findings of blood chemistry: Secondary | ICD-10-CM | POA: Diagnosis not present

## 2021-07-21 DIAGNOSIS — Z90710 Acquired absence of both cervix and uterus: Secondary | ICD-10-CM | POA: Diagnosis not present

## 2021-07-21 DIAGNOSIS — I110 Hypertensive heart disease with heart failure: Secondary | ICD-10-CM | POA: Diagnosis present

## 2021-07-21 DIAGNOSIS — Z7982 Long term (current) use of aspirin: Secondary | ICD-10-CM

## 2021-07-21 DIAGNOSIS — I5032 Chronic diastolic (congestive) heart failure: Secondary | ICD-10-CM | POA: Diagnosis present

## 2021-07-21 DIAGNOSIS — E785 Hyperlipidemia, unspecified: Secondary | ICD-10-CM

## 2021-07-21 DIAGNOSIS — Z20822 Contact with and (suspected) exposure to covid-19: Secondary | ICD-10-CM | POA: Diagnosis present

## 2021-07-21 DIAGNOSIS — J9621 Acute and chronic respiratory failure with hypoxia: Secondary | ICD-10-CM | POA: Diagnosis present

## 2021-07-21 DIAGNOSIS — I350 Nonrheumatic aortic (valve) stenosis: Secondary | ICD-10-CM

## 2021-07-21 DIAGNOSIS — I5033 Acute on chronic diastolic (congestive) heart failure: Secondary | ICD-10-CM | POA: Diagnosis present

## 2021-07-21 DIAGNOSIS — M1712 Unilateral primary osteoarthritis, left knee: Secondary | ICD-10-CM

## 2021-07-21 DIAGNOSIS — J189 Pneumonia, unspecified organism: Principal | ICD-10-CM | POA: Diagnosis present

## 2021-07-21 DIAGNOSIS — R55 Syncope and collapse: Secondary | ICD-10-CM | POA: Diagnosis not present

## 2021-07-21 DIAGNOSIS — F17210 Nicotine dependence, cigarettes, uncomplicated: Secondary | ICD-10-CM | POA: Diagnosis present

## 2021-07-21 DIAGNOSIS — J9620 Acute and chronic respiratory failure, unspecified whether with hypoxia or hypercapnia: Secondary | ICD-10-CM | POA: Diagnosis present

## 2021-07-21 DIAGNOSIS — R0600 Dyspnea, unspecified: Secondary | ICD-10-CM

## 2021-07-21 LAB — CBC
HCT: 41.2 % (ref 36.0–46.0)
Hemoglobin: 13 g/dL (ref 12.0–15.0)
MCH: 29.8 pg (ref 26.0–34.0)
MCHC: 31.6 g/dL (ref 30.0–36.0)
MCV: 94.5 fL (ref 80.0–100.0)
Platelets: 178 10*3/uL (ref 150–400)
RBC: 4.36 MIL/uL (ref 3.87–5.11)
RDW: 17.6 % — ABNORMAL HIGH (ref 11.5–15.5)
WBC: 13.6 10*3/uL — ABNORMAL HIGH (ref 4.0–10.5)
nRBC: 0 % (ref 0.0–0.2)

## 2021-07-21 LAB — BASIC METABOLIC PANEL
Anion gap: 12 (ref 5–15)
BUN: 31 mg/dL — ABNORMAL HIGH (ref 8–23)
CO2: 31 mmol/L (ref 22–32)
Calcium: 9.8 mg/dL (ref 8.9–10.3)
Chloride: 95 mmol/L — ABNORMAL LOW (ref 98–111)
Creatinine, Ser: 1.28 mg/dL — ABNORMAL HIGH (ref 0.44–1.00)
GFR, Estimated: 43 mL/min — ABNORMAL LOW (ref >60)
Glucose, Bld: 107 mg/dL — ABNORMAL HIGH (ref 70–99)
Potassium: 4.4 mmol/L (ref 3.5–5.1)
Sodium: 138 mmol/L (ref 135–145)

## 2021-07-21 LAB — BLOOD GAS, VENOUS
Acid-Base Excess: 7.4 mmol/L — ABNORMAL HIGH (ref 0.0–2.0)
Bicarbonate: 34.5 mmol/L — ABNORMAL HIGH (ref 20.0–28.0)
Drawn by: 1517
FIO2: 36 %
O2 Saturation: 64.5 %
Patient temperature: 36.7
pCO2, Ven: 56 mmHg (ref 44–60)
pH, Ven: 7.39 (ref 7.25–7.43)
pO2, Ven: 37 mmHg (ref 32–45)

## 2021-07-21 LAB — BRAIN NATRIURETIC PEPTIDE: B Natriuretic Peptide: 136 pg/mL — ABNORMAL HIGH (ref 0.0–100.0)

## 2021-07-21 LAB — RESP PANEL BY RT-PCR (FLU A&B, COVID) ARPGX2
Influenza A by PCR: NEGATIVE
Influenza B by PCR: NEGATIVE
SARS Coronavirus 2 by RT PCR: NEGATIVE

## 2021-07-21 LAB — TROPONIN I (HIGH SENSITIVITY)
Troponin I (High Sensitivity): 8 ng/L (ref <18)
Troponin I (High Sensitivity): 11 ng/L (ref <18)

## 2021-07-21 MED ORDER — IPRATROPIUM-ALBUTEROL 0.5-2.5 (3) MG/3ML IN SOLN
3.0000 mL | RESPIRATORY_TRACT | Status: DC | PRN
Start: 2021-07-21 — End: 2021-07-24
  Administered 2021-07-22 – 2021-07-24 (×3): 3 mL via RESPIRATORY_TRACT
  Filled 2021-07-21 (×3): qty 3

## 2021-07-21 MED ORDER — SODIUM CHLORIDE 0.9 % IV SOLN
500.0000 mg | Freq: Once | INTRAVENOUS | Status: AC
  Administered 2021-07-21: 500 mg via INTRAVENOUS
  Filled 2021-07-21: qty 5

## 2021-07-21 MED ORDER — IPRATROPIUM-ALBUTEROL 0.5-2.5 (3) MG/3ML IN SOLN
RESPIRATORY_TRACT | Status: AC
  Administered 2021-07-21: 3 mL
  Filled 2021-07-21: qty 3

## 2021-07-21 MED ORDER — SODIUM CHLORIDE 0.9 % IV SOLN
1.0000 g | Freq: Once | INTRAVENOUS | Status: AC
  Administered 2021-07-21: 1 g via INTRAVENOUS

## 2021-07-21 MED ORDER — DM-GUAIFENESIN ER 30-600 MG PO TB12
1.0000 | ORAL_TABLET | Freq: Two times a day (BID) | ORAL | Status: DC
Start: 2021-07-22 — End: 2021-07-24
  Administered 2021-07-22 – 2021-07-24 (×5): 1 via ORAL
  Filled 2021-07-21 (×5): qty 1

## 2021-07-21 MED ORDER — METHYLPREDNISOLONE SODIUM SUCC 125 MG IJ SOLR
125.0000 mg | Freq: Once | INTRAMUSCULAR | Status: AC
  Administered 2021-07-21: 125 mg via INTRAVENOUS
  Filled 2021-07-21: qty 2

## 2021-07-21 MED ORDER — MAGNESIUM SULFATE 2 GM/50ML IV SOLN
2.0000 g | Freq: Once | INTRAVENOUS | Status: AC
  Administered 2021-07-21: 2 g via INTRAVENOUS
  Filled 2021-07-21: qty 50

## 2021-07-21 MED ORDER — IPRATROPIUM-ALBUTEROL 0.5-2.5 (3) MG/3ML IN SOLN
3.0000 mL | Freq: Once | RESPIRATORY_TRACT | Status: AC
  Administered 2021-07-21: 3 mL via RESPIRATORY_TRACT
  Filled 2021-07-21: qty 3

## 2021-07-21 MED ORDER — SODIUM CHLORIDE 0.9 % IV SOLN
1.0000 g | INTRAVENOUS | Status: DC
  Administered 2021-07-22 – 2021-07-24 (×3): 1 g via INTRAVENOUS
  Filled 2021-07-21 (×3): qty 10

## 2021-07-21 MED ORDER — ALBUTEROL SULFATE HFA 108 (90 BASE) MCG/ACT IN AERS: 2.0000 | INHALATION_SPRAY | RESPIRATORY_TRACT | Status: DC | PRN

## 2021-07-21 MED ORDER — AZITHROMYCIN 500 MG IV SOLR
500.0000 mg | INTRAVENOUS | Status: DC
  Administered 2021-07-22 – 2021-07-24 (×3): 500 mg via INTRAVENOUS
  Filled 2021-07-21 (×3): qty 5

## 2021-07-21 MED ORDER — ACETAMINOPHEN 325 MG PO TABS: 650.0000 mg | ORAL_TABLET | Freq: Four times a day (QID) | ORAL | Status: DC | PRN

## 2021-07-21 MED ORDER — CEFTRIAXONE SODIUM 1 G IJ SOLR
INTRAMUSCULAR | Status: AC
  Filled 2021-07-21: qty 10

## 2021-07-21 NOTE — Assessment & Plan Note (Addendum)
Stable, patient does not appear fluid overloaded. ?Echocardiogram done on 10/20/2019 showed LVEF of 60 to 70%.  LV has no RWMA.  LV diastolic parameters are consistent with G1 DD ?Home Lopressor will be held at this time due to soft BP ?Continue Zocor, aspirin ? ?

## 2021-07-21 NOTE — ED Provider Notes (Signed)
[facility] EMERGENCY DEPARTMENT Provider Note   CSN: [id number] Arrival date & time: 07/21/21  1710     History  Chief Complaint  Patient presents with   Shortness of Breath    Ashley Houston is a 81 y.o. female.  She has a history of COPD.  She uses 3-1/2 L of oxygen and continues to smoke.  Brought in by her daughter today for increased shortness of breath since this morning.  Also feeling generally more tired and weak.  She has had a nonproductive cough.  No fevers or chills no chest pain or abdominal pain no vomiting diarrhea or urinary symptoms.  No recent falls.  When she presented to triage she was off oxygen and her sats were 65%, improved to mid 80s with her home O2 amount.   [name] and a relative.  Shortness of Breath Severity:  Moderate Onset quality:  Gradual Duration:  1 day Timing:  Constant Progression:  Unchanged Chronicity:  Recurrent Relieved by:  Nothing Worsened by:  Activity Ineffective treatments:  Oxygen and rest Associated symptoms: cough, headaches and wheezing   Associated symptoms: no abdominal pain, no chest pain, no fever, no neck pain, no rash, no sore throat, no sputum production and no vomiting   Risk factors: tobacco use       Home Medications Prior to Admission medications   Medication Sig Start Date End Date Taking? Authorizing Provider  albuterol (VENTOLIN HFA) 108 (90 Base) MCG/ACT inhaler Inhale 2 puffs into the lungs every 6 (six) hours as needed for wheezing or shortness of breath.  10/03/19   [provider]  ASPIRIN ADULT LOW STRENGTH 81 MG EC tablet Take 81 mg by mouth in the morning.  06/18/17   [provider]  Calcium Carbonate-Vitamin D (CALCARB 600/D PO) Take 1 tablet by mouth in the morning.    [provider]  diclofenac sodium (VOLTAREN) 1 % GEL Apply 1 application topically 3 (three) times daily. Left knee Patient taking differently: Apply 1 application topically 3  (three) times daily as needed. Left knee/hip 04/19/18   Ranelle Oyster, MD  doxepin (SINEQUAN) 100 MG capsule Take 100 mg by mouth at bedtime.  07/31/15   [provider]  Ferrous Sulfate (IRON) 325 (65 Fe) MG TABS Take 325 mg by mouth daily.  04/24/17   [provider]  gabapentin (NEURONTIN) 300 MG capsule Take 300 mg by mouth 3 (three) times daily. 06/18/17   [provider]  ipratropium-albuterol (DUONEB) 0.5-2.5 (3) MG/3ML SOLN Take 3 mLs by nebulization 3 (three) times daily. Patient taking differently: Take 3 mLs by nebulization every 6 (six) hours as needed (for shortness of breath). 10/18/15   Richarda Overlie, MD  levothyroxine (SYNTHROID) 50 MCG tablet Take 50 mcg by mouth daily before breakfast.  10/03/19   [provider]  LORazepam (ATIVAN) 2 MG tablet Take 2 mg by mouth 3 (three) times daily. 10/25/20   [provider]  metoprolol tartrate (LOPRESSOR) 25 MG tablet Take 12.5 mg by mouth in the morning.  10/03/19   [provider]  metoprolol tartrate (LOPRESSOR) 50 MG tablet TAKE 1 TABLET BY MOUTH 2 HOURS PRIOR TO THE CARDIAC CT 10/26/19   Wendall Stade, MD  Multiple Vitamin (MULTIVITAMIN WITH MINERALS) TABS tablet Take 1 tablet by mouth 2 (two) times daily.    [provider]  oxyCODONE-acetaminophen (PERCOCET) 10-325 MG tablet Take 1 tablet by mouth every 8 (eight) hours as needed  for pain. Do Not Fill Before 07/04/2021 07/01/21   Jones Bales, NP  simvastatin (ZOCOR) 20 MG tablet Take 20 mg by mouth at bedtime. 06/18/17   [provider]      Allergies    Lipitor [atorvastatin]    Review of Systems   Review of Systems  Constitutional:  Positive for fatigue. Negative for fever.  HENT:  Negative for sore throat.   Respiratory:  Positive for cough, shortness of breath and wheezing. Negative for sputum production.   Cardiovascular:  Negative for chest pain.  Gastrointestinal:  Negative for abdominal pain and  vomiting.  Genitourinary:  Negative for dysuria.  Musculoskeletal:  Negative for neck pain.  Skin:  Negative for rash.  Neurological:  Positive for headaches.   Physical Exam Updated Vital Signs BP (!) 111/56    Pulse 89    Temp 98 F (36.7 C)    Resp 18    Ht 5\' 6"  (1.676 m)    Wt 59.9 kg    SpO2 (!) 81%    BMI 21.31 kg/m  Physical Exam Vitals and nursing note reviewed.  Constitutional:      General: She is not in acute distress.    Appearance: She is well-developed.  HENT:     Head: Normocephalic and atraumatic.  Eyes:     Conjunctiva/sclera: Conjunctivae normal.  Cardiovascular:     Rate and Rhythm: Normal rate and regular rhythm.     Heart sounds: Murmur (SEM) heard.  Pulmonary:     Effort: Pulmonary effort is normal. No respiratory distress.     Breath sounds: Wheezing present.  Abdominal:     Palpations: Abdomen is soft.     Tenderness: There is no abdominal tenderness.  Musculoskeletal:        General: No swelling.     Cervical back: Neck supple.     Right lower leg: No tenderness.     Left lower leg: No tenderness.  Skin:    General: Skin is warm and dry.     Capillary Refill: Capillary refill takes less than 2 seconds.  Neurological:     General: No focal deficit present.     Mental Status: She is alert.    ED Results / Procedures / Treatments   Labs (all labs ordered are listed, but only abnormal results are displayed) Labs Reviewed  BASIC METABOLIC PANEL - Abnormal; Notable for the following components:      Result Value   Chloride 95 (*)    Glucose, Bld 107 (*)    BUN 31 (*)    Creatinine, Ser 1.28 (*)    GFR, Estimated 43 (*)    All other components within normal limits  CBC - Abnormal; Notable for the following components:   WBC 13.6 (*)    RDW 17.6 (*)    All other components within normal limits  BRAIN NATRIURETIC PEPTIDE - Abnormal; Notable for the following components:   B Natriuretic Peptide 136.0 (*)    All other components within normal  limits  BLOOD GAS, VENOUS - Abnormal; Notable for the following components:   Bicarbonate 34.5 (*)    Acid-Base Excess 7.4 (*)    All other components within normal limits  COMPREHENSIVE METABOLIC PANEL - Abnormal; Notable for the following components:   Glucose, Bld 160 (*)    BUN 30 (*)    Creatinine, Ser 1.02 (*)    GFR, Estimated 56 (*)    All other components within normal limits  CBC -  Abnormal; Notable for the following components:   WBC 19.4 (*)    Hemoglobin 11.7 (*)    RDW 17.6 (*)    All other components within normal limits  MAGNESIUM - Abnormal; Notable for the following components:   Magnesium 2.5 (*)    All other components within normal limits  RESP PANEL BY RT-PCR (FLU A&B, COVID) ARPGX2  CULTURE, BLOOD (ROUTINE X 2)  CULTURE, BLOOD (ROUTINE X 2)  EXPECTORATED SPUTUM ASSESSMENT W GRAM STAIN, RFLX TO RESP C  PROCALCITONIN  APTT  PHOSPHORUS  URINALYSIS, ROUTINE W REFLEX MICROSCOPIC  LEGIONELLA PNEUMOPHILA SEROGP 1 UR AG  STREP PNEUMONIAE URINARY ANTIGEN  TROPONIN I (HIGH SENSITIVITY)  TROPONIN I (HIGH SENSITIVITY)    EKG None  Radiology DG Chest 2 View  Result Date: 07/21/2021 CLINICAL DATA:  Shortness of breath.  History of COPD. EXAM: CHEST - 2 VIEW COMPARISON:  10/19/2019 FINDINGS: Hyperinflation. Patient rotated right on the frontal. Mild cardiomegaly. Atherosclerosis in the transverse aorta. No pleural effusion or pneumothorax. Moderate pulmonary interstitial thickening. Increased retrocardiac density on the lateral view is favored to correspond to increased right base density on the frontal radiograph. Osteopenia. IMPRESSION: Retrocardiac airspace disease, likely within the right lower lobe, most consistent with pneumonia. Followup PA and lateral chest X-ray is recommended in 3-4 weeks following trial of antibiotic therapy to ensure resolution and exclude underlying malignancy. Hyperinflation and interstitial thickening, consistent with the clinical history  of COPD/chronic bronchitis. Electronically Signed   By: Jeronimo Greaves M.D.   On: 07/21/2021 18:01    Procedures Procedures    Medications Ordered in ED Medications  albuterol (VENTOLIN HFA) 108 (90 Base) MCG/ACT inhaler 2 puff (has no administration in time range)  enoxaparin (LOVENOX) injection 40 mg (40 mg Subcutaneous Given 07/22/21 0545)  acetaminophen (TYLENOL) tablet 650 mg (has no administration in time range)    Or  acetaminophen (TYLENOL) suppository 650 mg (has no administration in time range)  ondansetron (ZOFRAN) tablet 4 mg (has no administration in time range)    Or  ondansetron (ZOFRAN) injection 4 mg (has no administration in time range)  ipratropium-albuterol (DUONEB) 0.5-2.5 (3) MG/3ML nebulizer solution 3 mL (3 mLs Nebulization Given 07/22/21 0554)  cefTRIAXone (ROCEPHIN) 1 g in sodium chloride 0.9 % 100 mL IVPB (1 g Intravenous New Bag/Given 07/22/21 0802)  azithromycin (ZITHROMAX) 500 mg in sodium chloride 0.9 % 250 mL IVPB (500 mg Intravenous New Bag/Given 07/22/21 0916)  acetaminophen (TYLENOL) tablet 650 mg (has no administration in time range)  dextromethorphan-guaiFENesin (MUCINEX DM) 30-600 MG per 12 hr tablet 1 tablet (1 tablet Oral Given 07/22/21 0801)  ipratropium-albuterol (DUONEB) 0.5-2.5 (3) MG/3ML nebulizer solution 3 mL (3 mLs Nebulization Given 07/22/21 0736)  LORazepam (ATIVAN) tablet 1 mg (has no administration in time range)  oxyCODONE-acetaminophen (PERCOCET) 7.5-325 MG per tablet 1 tablet (has no administration in time range)  ipratropium-albuterol (DUONEB) 0.5-2.5 (3) MG/3ML nebulizer solution 3 mL (3 mLs Nebulization Given 07/21/21 1823)  magnesium sulfate IVPB 2 g 50 mL (0 g Intravenous Stopped 07/21/21 1939)  methylPREDNISolone sodium succinate (SOLU-MEDROL) 125 mg/2 mL injection 125 mg (125 mg Intravenous Given 07/21/21 1847)  cefTRIAXone (ROCEPHIN) 1 g in sodium chloride 0.9 % 100 mL IVPB (0 g Intravenous Stopped 07/22/21 0753)  azithromycin (ZITHROMAX)  500 mg in sodium chloride 0.9 % 250 mL IVPB (0 mg Intravenous Stopped 07/21/21 2042)  ipratropium-albuterol (DUONEB) 0.5-2.5 (3) MG/3ML nebulizer solution (3 mLs  Given 07/21/21 2138)    ED Course/ Medical Decision Making/ A&P Clinical  Course as of 07/22/21 0947  Wynelle LinkSun Jul 21, 2021  1808 Chest x-ray interpreted by me as possible retrocardiac Ultravate.  Awaiting radiology reading. [MB]  1857 Last echo that I can find is 6/21 when she was noted to have severe aortic stenosis. [MB]  2038 Discussed with Dr. Thomes DinningAdefeso [facility] hospitalist who will evaluate the patient for admission. [MB]    Clinical Course User Index [MB] Terrilee FilesButler, Farah Lepak C, MD                           Medical Decision Making Amount and/or Complexity of Data Reviewed Labs: ordered. Radiology: ordered.  Risk Prescription drug management. Decision regarding hospitalization.  Jule Economyhyllis Houston was evaluated in Emergency Department on 07/21/2021 for the symptoms described in the history of present illness. She was evaluated in the context of the global COVID-19 pandemic, which necessitated consideration that the patient might be at risk for infection with the SARS-CoV-2 virus that causes COVID-19. Institutional protocols and algorithms that pertain to the evaluation of patients at risk for COVID-19 are in a state of rapid change based on information released by regulatory bodies including the CDC and federal and state organizations. These policies and algorithms were followed during the patient's care in the ED.  This patient complains of shortness of breath cough fatigue; this involves an extensive number of treatment Options and is a complaint that carries with it a high risk of complications and morbidity. The differential includes pneumonia, PE, pneumothorax, COPD exacerbation, COVID, flu CHF, anemia  I ordered, reviewed and interpreted labs, which included CBC with elevated white count stable hemoglobin, chemistries with mild AKI,  COVID and flu negative, VBG without significant CO2 retention, troponins flat, BNP mildly elevated I ordered medication IV steroids IV antibiotics breathing treatments magnesium and reviewed PMP when indicated. I ordered imaging studies which included chest x-ray and I independently    visualized and interpreted imaging which showed retrocardiac infiltrate Additional history obtained from patient's daughters Previous records obtained and reviewed in epic, no recent admissions I consulted Dr. Thomes DinningAdefeso [facility] hospitalist and discussed lab and imaging findings and discussed disposition.  Cardiac monitoring reviewed, normal sinus rhythm Social determinants considered, ongoing tobacco use Critical Interventions: None  After the interventions stated above, I reevaluated the patient and found patient still to have borderline sats and requiring extra oxygen. Admission and further testing considered, she would benefit from admission to the hospital for further management of pneumonia and COPD exacerbation.          Final Clinical Impression(s) / ED Diagnoses Final diagnoses:  Community acquired pneumonia of right lung, unspecified part of lung  COPD exacerbation (HCC)  Aortic valve stenosis, etiology of cardiac valve disease unspecified  AKI (acute kidney injury) (HCC)    Rx / DC Orders ED Discharge Orders     None         Terrilee FilesButler, Renold Kozar C, MD 07/22/21 [phone]

## 2021-07-21 NOTE — Assessment & Plan Note (Addendum)
BNP 136, though patient does not appear fluid overloaded, it to be reasonable to rule out acute on chronic diastolic CHF ?Continue total input/output, daily weights and fluid restriction ?Continue Cardiac diet  ?Echocardiogram in the morning  ? ? ? ?

## 2021-07-21 NOTE — Assessment & Plan Note (Signed)
Continue Synthroid °

## 2021-07-21 NOTE — Assessment & Plan Note (Signed)
Patient counseled on tobacco abuse cessation ?

## 2021-07-21 NOTE — ED Triage Notes (Signed)
Pt presents today c/o SHOB with cough and fatigue. Pt has hx of COPD. Is supposed to wear oxygen at all times but arrived to ED without any, o2 sat was 66% on RA. Pt states she woke up this morning feeling tired and went back to bed and took a nap. Reports productive cough, states cough is no worse than her normal cough. Denies fevers or other sx. Pt NAD in triage. After applying 02 pt up to 81%  on 4 [address] [place] ?

## 2021-07-21 NOTE — Assessment & Plan Note (Signed)
- 

## 2021-07-21 NOTE — Assessment & Plan Note (Signed)
Continue Zocor 

## 2021-07-21 NOTE — Assessment & Plan Note (Signed)
Chest x-ray showed retrocardiac airspace disease, likely within the right lower lobe, most consistent with pneumonia. ?Patient was started on IV ceftriaxone and IV azithromycin, we shall continue same at this time with plan to de-escalate/discontinue based on blood culture, sputum culture, urine Legionella, strep pneumo and procalcitonin ?Continue Tylenol as needed ?Continue Mucinex, incentive spirometry, flutter valve  ? ? ?

## 2021-07-21 NOTE — H&P (Signed)
History and Physical    Patient: Ashley Houston DOB: October 19, 1940 DOA: 07/21/2021 DOS: the patient was seen and examined on 07/22/2021 PCP: Lanelle Bal, PA-C  Patient coming from: Home  Chief Complaint:  Chief Complaint  Patient presents with   Shortness of Breath   HPI: Ashley Houston is a 81 y.o. female with medical history significant of COPD on supplemental oxygen at 2 LPM, hypothyroidism, chronic diastolic CHF, hyperlipidemia and tobacco abuse who presents to the emergency department due to increased shortness of breath which started this morning.  Patient was sleepy at bedside, though was easily aroused, bowel she was normal unable to provide any history.  History was obtained from ED physician and ED medical record.  Per report, patient was supposed to be on supplemental oxygen 24/7.  She woke up this morning feeling tired, so she went back to bed, she was still having shortness of breath and cough which was nonproductive, so she decided to go to the ED for further evaluation and management.  On arrival to the ED O2 sat was 65%.  There was no report of fever, chills, chest pain, nausea, vomiting, abdominal pain,  ED course: The emergency department, she was tachypneic, BP was 111/56, HR 89 bpm, P with O2 sat of 83 to 89% on 4 LPM of oxygen.  Work-up in the ED showed normal CBC except for leukocytosis, BMP showed BUN/creatinine 31/1.28 (this was 0.8 in June 2021), BNP 136, troponin x1 was negative.  Influenza A, B, SARS coronavirus 2 was negative. Chest x-ray showed retrocardiac airspace disease, likely within the right lower lobe, most consistent with pneumonia. She was treated with IV ceftriaxone and azithromycin, breathing treatment was provided. Magnesium and solu-medrol was provided.  Hospitalist was asked to admit patient for further evaluation and management.  Review of Systems: As mentioned in the history of present illness. All other systems reviewed and are  negative.   Past Medical History:  Diagnosis Date   Anxiety    Chronic diastolic heart failure (HCC)    Chronic pain    hips back and shoulders   COPD (chronic obstructive pulmonary disease) ([place])    Depression    Hypothyroidism 09/08/2015   Tachycardia    Thyroid disease    Past Surgical History:  Procedure Laterality Date   ABDOMINAL HYSTERECTOMY     CHOLECYSTECTOMY     TONSILLECTOMY     Social History:  reports that she has been smoking cigarettes. She has been smoking an average of .5 packs per day. She has never used smokeless tobacco. She reports that she does not drink alcohol and does not use drugs.  Allergies  Allergen Reactions   Lipitor [Atorvastatin] Itching    History reviewed. No pertinent family history.  Prior to Admission medications   Medication Sig Start Date End Date Taking? Authorizing Provider  acetaminophen (TYLENOL) 500 MG tablet Take 500 mg by mouth every 6 (six) hours as needed.   Yes [provider]  albuterol (VENTOLIN HFA) 108 (90 Base) MCG/ACT inhaler Inhale 2 puffs into the lungs every 6 (six) hours as needed for wheezing or shortness of breath.  10/03/19  Yes [provider]  ASPIRIN ADULT LOW STRENGTH 81 MG EC tablet Take 81 mg by mouth in the morning.  06/18/17  Yes [provider]  Calcium Carbonate-Vitamin D (CALCARB 600/D PO) Take 1 tablet by mouth in the morning.   Yes [provider]  diclofenac sodium (VOLTAREN) 1 % GEL Apply 1 application topically 3 (  three) times daily. Left knee Patient taking differently: Apply 1 application. topically 3 (three) times daily as needed. Left knee/hip 04/19/18  Yes Meredith Staggers, MD  doxepin (SINEQUAN) 100 MG capsule Take 100 mg by mouth at bedtime.  07/31/15  Yes [provider]  Ferrous Sulfate (IRON) 325 (65 Fe) MG TABS Take 325 mg by mouth daily.  04/24/17  Yes [provider]  fluticasone (FLONASE) 50 MCG/ACT nasal spray Place into both nostrils  daily.   Yes [provider]  gabapentin (NEURONTIN) 300 MG capsule Take 300 mg by mouth 3 (three) times daily. 06/18/17  Yes [provider]  ipratropium-albuterol (DUONEB) 0.5-2.5 (3) MG/3ML SOLN Take 3 mLs by nebulization 3 (three) times daily. Patient taking differently: Take 3 mLs by nebulization every 6 (six) hours as needed (for shortness of breath). 10/18/15  Yes Reyne Dumas, MD  levothyroxine (SYNTHROID) 50 MCG tablet Take 50 mcg by mouth daily before breakfast.  10/03/19  Yes [provider]  LORazepam (ATIVAN) 2 MG tablet Take 2 mg by mouth 3 (three) times daily. 10/25/20  Yes [provider]  metoprolol tartrate (LOPRESSOR) 25 MG tablet Take 12.5 mg by mouth in the morning.  10/03/19  Yes [provider]  Multiple Vitamin (MULTIVITAMIN WITH MINERALS) TABS tablet Take 1 tablet by mouth 2 (two) times daily.   Yes [provider]  oxyCODONE-acetaminophen (PERCOCET) 10-325 MG tablet Take 1 tablet by mouth every 8 (eight) hours as needed for pain. Do Not Fill Before 07/04/2021 07/01/21  Yes Bayard Hugger, NP  simvastatin (ZOCOR) 20 MG tablet Take 20 mg by mouth at bedtime. 06/18/17  Yes [provider]  metoprolol tartrate (LOPRESSOR) 50 MG tablet TAKE 1 TABLET BY MOUTH 2 HOURS PRIOR TO THE CARDIAC CT Patient not taking: Reported on 07/21/2021 10/26/19   Josue Hector, MD    Physical Exam: Vitals:   07/21/21 2135 07/21/21 2140 07/21/21 2141 07/21/21 2314  BP:    (!) 104/48  Pulse:  96 96 93  Resp:  (!) 22 (!) 22 14  Temp:    98.8 F (37.1 C)  TempSrc:    Oral  SpO2: 90% 91% 90% 91%  Weight:    59.4 kg  Height:    5\' 6"  (1.676 m)   General: Elderly . Awake and alert and oriented x3. Not in any acute distress.  HEENT: NCAT.  PERRLA. EOMI. Sclerae anicteric.  Moist mucosal membranes. Neck: Neck supple without lymphadenopathy. No carotid bruits. No masses palpated.  Cardiovascular: Regular rate with normal S1-S2 sounds. [id number]  systolic ejection murmur at the 2nd right ICS, rubs or gallops auscultated. No JVD.  Respiratory: Clear breath sounds.  No accessory muscle use. Abdomen: Soft, nontender, nondistended. Active bowel sounds. No masses or hepatosplenomegaly  Skin: No rashes, lesions, or ulcerations.  Dry, warm to touch. Musculoskeletal:  2+ dorsalis pedis and radial pulses. Good ROM.  No contractures  Psychiatric: Intact judgment and insight.  Mood appropriate to current condition. Neurologic: No focal neurological deficits. Strength is 5/5 x 4.  CN II - XII grossly intact.  Data Reviewed: There are no new results to review at this time.  Assessment and Plan: * CAP (community acquired pneumonia) Chest x-ray showed retrocardiac airspace disease, likely within the right lower lobe, most consistent with pneumonia. Patient was started on IV ceftriaxone and IV azithromycin, we shall continue same at this time with plan to de-escalate/discontinue based on blood culture, sputum culture, urine Legionella, strep pneumo and procalcitonin  Continue Tylenol as needed Continue Mucinex, incentive spirometry, flutter valve     Hyperlipidemia Continue Zocor  Elevated brain natriuretic peptide (BNP) level BNP 136, though patient does not appear fluid overloaded, it to be reasonable to rule out acute on chronic diastolic CHF Continue total input/output, daily weights and fluid restriction Continue Cardiac diet  Echocardiogram in the morning      AKI (acute kidney injury) ([place]) BUN/creatinine 31/1.28 (this was 0.8 in June 2021) Renally adjust medications, avoid nephrotoxic agents/dehydration/hypotension   Acute on chronic respiratory failure ([place]) Patient was hypoxic despite being on home supplemental oxygen at 4 PM, O2 sats was started to range within 83 to 89%.  This improved at 5 LPM  Hypothyroidism Continue Synthroid  Chronic diastolic heart failure (HCC) Stable, patient does not appear fluid  overloaded. Echocardiogram done on 10/20/2019 showed LVEF of 60 to 70%.  LV has no RWMA.  LV diastolic parameters are consistent with G1 DD Home Lopressor will be held at this time due to soft BP Continue Zocor, aspirin   Tobacco abuse Patient counseled on tobacco abuse cessation  COPD (chronic obstructive pulmonary disease) with emphysema ([place]) Continue DuoNebs as needed      Advance Care Planning:   Code Status: Full Code   Consults: None  Family Communication: None at bedside  Severity of Illness: The appropriate patient status for this patient is INPATIENT. Inpatient status is judged to be reasonable and necessary in order to provide the required intensity of service to ensure the patient's safety. The patient's presenting symptoms, physical exam findings, and initial radiographic and laboratory data in the context of their chronic comorbidities is felt to place them at high risk for further clinical deterioration. Furthermore, it is not anticipated that the patient will be medically stable for discharge from the hospital within 2 midnights of admission.   * I certify that at the point of admission it is my clinical judgment that the patient will require inpatient hospital care spanning beyond 2 midnights from the point of admission due to high intensity of service, high risk for further deterioration and high frequency of surveillance required.*  Author: Bernadette Hoit, DO 07/22/2021 2:24 AM  For on call review [email or web address].

## 2021-07-22 ENCOUNTER — Inpatient Hospital Stay (HOSPITAL_COMMUNITY): Payer: Medicare Other

## 2021-07-22 ENCOUNTER — Other Ambulatory Visit: Payer: Self-pay

## 2021-07-22 DIAGNOSIS — I350 Nonrheumatic aortic (valve) stenosis: Secondary | ICD-10-CM

## 2021-07-22 DIAGNOSIS — I5032 Chronic diastolic (congestive) heart failure: Secondary | ICD-10-CM

## 2021-07-22 LAB — ECHOCARDIOGRAM COMPLETE
AR max vel: 0.59 cm2
AV Area VTI: 0.7 cm2
AV Area mean vel: 0.54 cm2
AV Mean grad: 58.7 mmHg
AV Peak grad: 97.7 mmHg
Ao pk vel: 4.94 m/s
Area-P 1/2: 4.21 cm2
Calc EF: 60 %
Height: 66 in
MV VTI: 3.32 cm2
S' Lateral: 2.8 cm
Single Plane A2C EF: 64.9 %
Single Plane A4C EF: 52.1 %
Weight: 2091.72 oz

## 2021-07-22 LAB — CBC
HCT: 37.6 % (ref 36.0–46.0)
Hemoglobin: 11.7 g/dL — ABNORMAL LOW (ref 12.0–15.0)
MCH: 28.8 pg (ref 26.0–34.0)
MCHC: 31.1 g/dL (ref 30.0–36.0)
MCV: 92.6 fL (ref 80.0–100.0)
Platelets: 171 10*3/uL (ref 150–400)
RBC: 4.06 MIL/uL (ref 3.87–5.11)
RDW: 17.6 % — ABNORMAL HIGH (ref 11.5–15.5)
WBC: 19.4 10*3/uL — ABNORMAL HIGH (ref 4.0–10.5)
nRBC: 0 % (ref 0.0–0.2)

## 2021-07-22 LAB — URINALYSIS, ROUTINE W REFLEX MICROSCOPIC
Bilirubin Urine: NEGATIVE
Glucose, UA: NEGATIVE mg/dL
Hgb urine dipstick: NEGATIVE
Ketones, ur: NEGATIVE mg/dL
Nitrite: NEGATIVE
Protein, ur: NEGATIVE mg/dL
Specific Gravity, Urine: 1.02 (ref 1.005–1.030)
pH: 6 (ref 5.0–8.0)

## 2021-07-22 LAB — COMPREHENSIVE METABOLIC PANEL
ALT: 17 U/L (ref 0–44)
AST: 18 U/L (ref 15–41)
Albumin: 3.6 g/dL (ref 3.5–5.0)
Alkaline Phosphatase: 81 U/L (ref 38–126)
Anion gap: 10 (ref 5–15)
BUN: 30 mg/dL — ABNORMAL HIGH (ref 8–23)
CO2: 29 mmol/L (ref 22–32)
Calcium: 9.6 mg/dL (ref 8.9–10.3)
Chloride: 100 mmol/L (ref 98–111)
Creatinine, Ser: 1.02 mg/dL — ABNORMAL HIGH (ref 0.44–1.00)
GFR, Estimated: 56 mL/min — ABNORMAL LOW (ref >60)
Glucose, Bld: 160 mg/dL — ABNORMAL HIGH (ref 70–99)
Potassium: 4.2 mmol/L (ref 3.5–5.1)
Sodium: 139 mmol/L (ref 135–145)
Total Bilirubin: 0.4 mg/dL (ref 0.3–1.2)
Total Protein: 6.9 g/dL (ref 6.5–8.1)

## 2021-07-22 LAB — APTT: aPTT: 28 seconds (ref 24–36)

## 2021-07-22 LAB — MAGNESIUM: Magnesium: 2.5 mg/dL — ABNORMAL HIGH (ref 1.7–2.4)

## 2021-07-22 LAB — PROCALCITONIN: Procalcitonin: 0.1 ng/mL

## 2021-07-22 LAB — STREP PNEUMONIAE URINARY ANTIGEN: Strep Pneumo Urinary Antigen: NEGATIVE

## 2021-07-22 LAB — PHOSPHORUS: Phosphorus: 3.5 mg/dL (ref 2.5–4.6)

## 2021-07-22 MED ORDER — DOXEPIN HCL 25 MG PO CAPS
100.0000 mg | ORAL_CAPSULE | Freq: Every day | ORAL | Status: DC
Start: 2021-07-22 — End: 2021-07-24
  Administered 2021-07-22 – 2021-07-23 (×2): 100 mg via ORAL
  Filled 2021-07-22 (×2): qty 4

## 2021-07-22 MED ORDER — ONDANSETRON HCL 4 MG/2ML IJ SOLN
4.0000 mg | Freq: Four times a day (QID) | INTRAMUSCULAR | Status: DC | PRN
Start: 2021-07-22 — End: 2021-07-24

## 2021-07-22 MED ORDER — ACETAMINOPHEN 325 MG PO TABS: 650.0000 mg | ORAL_TABLET | Freq: Four times a day (QID) | ORAL | Status: DC | PRN

## 2021-07-22 MED ORDER — LORAZEPAM 1 MG PO TABS
1.0000 mg | ORAL_TABLET | Freq: Three times a day (TID) | ORAL | Status: DC | PRN
  Administered 2021-07-22 – 2021-07-24 (×4): 1 mg via ORAL
  Filled 2021-07-22 (×4): qty 1

## 2021-07-22 MED ORDER — IPRATROPIUM-ALBUTEROL 0.5-2.5 (3) MG/3ML IN SOLN
3.0000 mL | Freq: Three times a day (TID) | RESPIRATORY_TRACT | Status: DC
  Administered 2021-07-22 – 2021-07-24 (×7): 3 mL via RESPIRATORY_TRACT
  Filled 2021-07-22 (×2): qty 3
  Filled 2021-07-22: qty 6
  Filled 2021-07-22 (×3): qty 3

## 2021-07-22 MED ORDER — OXYCODONE-ACETAMINOPHEN 7.5-325 MG PO TABS
1.0000 | ORAL_TABLET | Freq: Three times a day (TID) | ORAL | Status: DC | PRN
  Administered 2021-07-22 – 2021-07-23 (×2): 1 via ORAL
  Filled 2021-07-22 (×2): qty 1

## 2021-07-22 MED ORDER — METHYLPREDNISOLONE SODIUM SUCC 40 MG IJ SOLR
40.0000 mg | Freq: Two times a day (BID) | INTRAMUSCULAR | Status: DC
  Administered 2021-07-22 – 2021-07-24 (×4): 40 mg via INTRAVENOUS
  Filled 2021-07-22 (×4): qty 1

## 2021-07-22 MED ORDER — ONDANSETRON HCL 4 MG PO TABS: 4.0000 mg | ORAL_TABLET | Freq: Four times a day (QID) | ORAL | Status: DC | PRN

## 2021-07-22 MED ORDER — ENOXAPARIN SODIUM 40 MG/0.4ML IJ SOSY
40.0000 mg | PREFILLED_SYRINGE | INTRAMUSCULAR | Status: DC
  Administered 2021-07-22 – 2021-07-24 (×3): 40 mg via SUBCUTANEOUS
  Filled 2021-07-22 (×3): qty 0.4

## 2021-07-22 MED ORDER — ACETAMINOPHEN 650 MG RE SUPP: 650.0000 mg | Freq: Four times a day (QID) | RECTAL | Status: DC | PRN

## 2021-07-22 NOTE — Progress Notes (Signed)
PROGRESS NOTE     Ashley Houston, is a 81 y.o. female, DOB - 14-Jul-1940, [id number]  Admit date - 07/21/2021   Admitting Physician Bernadette Hoit, DO  Outpatient Primary MD for the patient is Lanelle Bal, PA-C  LOS - 1  Chief Complaint  Patient presents with   Shortness of Breath        Brief Narrative:    No notes on file    -Assessment and Plan:  1)CAP (community acquired pneumonia) --Continue Rocephin and azithromycin along with bronchodilators and mucolytic's   2) severe aortic stenosis --- echo from 07/22/2021 noted with doppler  interrogation suggests severe aortic stenosis with AVA 0.75cm2, mean gradient 94mmHg, Vmax 51m/s, and DI 0.21. This has been noted on previous  echoes as well.  -Await cardiology input  3)HFpEF-possible mild acute on chronic diastolic dysfunction CHF Admitted with worsening hypoxia, dyspnea and elevated BNP -Suspect most of patient's respiratory problem/dyspnea and hypoxia is probably related to pneumonia and COPD rather than diastolic dysfunction exacerbation -Hold off on aggressive diuresis at this time -Echo from 07/22/2021 with EF of 65% with severe aortic stenosis -Metoprolol on hold due to soft BP  4)AKI (acute kidney injury) ([place]) -Creatinine improving Renally adjust medications, avoid nephrotoxic agents/dehydration/hypotension  5)Acute on chronic respiratory failure ([place]) PTA patient was on 2 to 3 L at home now requiring up to 5 L due to pneumonia with COPD exacerbation mostly -Treat as above #1  6)Hypothyroidism Continue Synthroid  7) COPD and tobacco abuse-- -Management as above #1, -Solu-Medrol added -Patient is not interested in smoking cessation -Patient admits to noncompliance with home O2 due to need to smoke  Disposition/Need for in-Hospital Stay- patient unable to be discharged at this time due to --- acute on chronic hypoxic respiratory failure secondary to COPD exacerbation and pneumonia--requiring IV  antibiotics steroids and supplemental oxygen  Status is: Inpatient   Disposition: The patient is from: Home              Anticipated d/c is to: Home              Anticipated d/c date is: 2 days              Patient currently is not medically stable to d/c. Barriers: Not Clinically Stable-   Code Status :  -  Code Status: Full Code   Family Communication:    NA (patient is alert, awake and coherent)   DVT Prophylaxis  :   - SCDs  enoxaparin (LOVENOX) injection 40 mg Start: 07/22/21 0600 SCDs Start: 07/22/21 0040   Lab Results  Component Value Date   PLT 171 07/22/2021    Inpatient Medications  Scheduled Meds:  dextromethorphan-guaiFENesin  1 tablet Oral BID   enoxaparin (LOVENOX) injection  40 mg Subcutaneous Q24H   ipratropium-albuterol  3 mL Nebulization TID   Continuous Infusions:  azithromycin 500 mg (07/22/21 0916)   cefTRIAXone (ROCEPHIN)  IV 1 g (07/22/21 0802)   PRN Meds:.acetaminophen **OR** acetaminophen, acetaminophen, albuterol, ipratropium-albuterol, LORazepam, ondansetron **OR** ondansetron (ZOFRAN) IV, oxyCODONE-acetaminophen   Anti-infectives (From admission, onward)    Start     Dose/Rate Route Frequency Ordered Stop   07/22/21 1000  cefTRIAXone (ROCEPHIN) 1 g in sodium chloride 0.9 % 100 mL IVPB        1 g 200 mL/hr over 30 Minutes Intravenous Every 24 hours 07/21/21 2354     07/22/21 1000  azithromycin (ZITHROMAX) 500 mg in sodium chloride 0.9 % 250 mL IVPB  500 mg 250 mL/hr over 60 Minutes Intravenous Every 24 hours 07/21/21 2354     07/21/21 1945  cefTRIAXone (ROCEPHIN) 1 g in sodium chloride 0.9 % 100 mL IVPB        1 g 200 mL/hr over 30 Minutes Intravenous  Once 07/21/21 1934 07/22/21 0753   07/21/21 1945  azithromycin (ZITHROMAX) 500 mg in sodium chloride 0.9 % 250 mL IVPB        500 mg 250 mL/hr over 60 Minutes Intravenous  Once 07/21/21 1934 07/21/21 2042         Subjective: Ashley Houston today has no fevers, no emesis,  No  chest pain,   -Cough and dyspnea persist -Continues to have significant dyspnea on minimal exertion   Objective: Vitals:   07/22/21 0634 07/22/21 0737 07/22/21 1349 07/22/21 1506  BP:   (!) 124/48   Pulse:   94   Resp:   17   Temp:   98.9 F (37.2 C)   TempSrc:   Oral   SpO2:  95% 93% 93%  Weight: 59.3 kg     Height:        Intake/Output Summary (Last 24 hours) at 07/22/2021 2000 Last data filed at 07/22/2021 1741 Gross per 24 hour  Intake 1440 ml  Output 800 ml  Net 640 ml   Filed Weights   07/21/21 1719 07/21/21 2314 07/22/21 0634  Weight: 59.9 kg 59.4 kg 59.3 kg    Physical Exam  Gen:- Awake Alert, some conversational dyspnea HEENT:- [place].AT, No sclera icterus Nose- [place] 5L/min Neck-Supple Neck,No JVD,.  Lungs-diminished breath sounds, scattered wheezes bilaterally  CV- S1, S2 normal, regular , 3/6 SM Abd-  +ve B.Sounds, Abd Soft, No tenderness,    Extremity/Skin:- Trace edema, pedal pulses present  Psych-affect is appropriate, oriented x3 Neuro-generalized weakness, no new focal deficits, no tremors  Data Reviewed: I have personally reviewed following labs and imaging studies  CBC: Recent Labs  Lab 07/21/21 1836 07/22/21 0501  WBC 13.6* 19.4*  HGB 13.0 11.7*  HCT 41.2 37.6  MCV 94.5 92.6  PLT 178 [id number]   Basic Metabolic Panel: Recent Labs  Lab 07/21/21 1836 07/22/21 0501  NA 138 139  K 4.4 4.2  CL 95* 100  CO2 31 29  GLUCOSE 107* 160*  BUN 31* 30*  CREATININE 1.28* 1.02*  CALCIUM 9.8 9.6  MG  --  2.5*  PHOS  --  3.5   GFR: Estimated Creatinine Clearance: 41.9 mL/min (A) (by C-G formula based on SCr of 1.02 mg/dL (H)). Liver Function Tests: Recent Labs  Lab 07/22/21 0501  AST 18  ALT 17  ALKPHOS 81  BILITOT 0.4  PROT 6.9  ALBUMIN 3.6   Cardiac Enzymes: No results for input(s): CKTOTAL, CKMB, CKMBINDEX, TROPONINI in the last 168 hours. BNP (last 3 results) No results for input(s): PROBNP in the last 8760 hours. HbA1C: No results for  input(s): HGBA1C in the last 72 hours. Sepsis Labs: @LABRCNTIP (procalcitonin:4,lacticidven:4) ) Recent Results (from the past 240 hour(s))  Culture, blood (routine x 2)     Status: None (Preliminary result)   Collection Time: 07/21/21  6:37 PM   Specimen: BLOOD LEFT HAND  Result Value Ref Range Status   Specimen Description BLOOD LEFT HAND BOTTLES DRAWN AEROBIC ONLY  Final   Special Requests Blood Culture adequate volume  Final   Culture   Final    NO GROWTH < 24 HOURS Performed at [facility], [address], [place], [place] [address]  Report Status PENDING  Incomplete  Resp Panel by RT-PCR (Flu A&B, Covid) Nasopharyngeal Swab     Status: None   Collection Time: 07/21/21  6:50 PM   Specimen: Nasopharyngeal Swab; Nasopharyngeal(NP) swabs in vial transport medium  Result Value Ref Range Status   SARS Coronavirus 2 by RT PCR NEGATIVE NEGATIVE Final    Comment: (NOTE) SARS-CoV-2 target nucleic acids are NOT DETECTED.  The SARS-CoV-2 RNA is generally detectable in upper respiratory specimens during the acute phase of infection. The lowest concentration of SARS-CoV-2 viral copies this assay can detect is 138 copies/mL. A negative result does not preclude SARS-Cov-2 infection and should not be used as the sole basis for treatment or other patient management decisions. A negative result may occur with  improper specimen collection/handling, submission of specimen other than nasopharyngeal swab, presence of viral mutation(s) within the areas targeted by this assay, and inadequate number of viral copies(<138 copies/mL). A negative result must be combined with clinical observations, patient history, and epidemiological information. The expected result is Negative.  Fact Sheet for Patients:  EntrepreneurPulse.com.au  Fact Sheet for Healthcare Providers:  IncredibleEmployment.be  This test is no t yet approved or cleared by the [place] FDA  and  has been authorized for detection and/or diagnosis of SARS-CoV-2 by FDA under an Emergency Use Authorization (EUA). This EUA will remain  in effect (meaning this test can be used) for the duration of the COVID-19 declaration under Section 564(b)(1) of the Act, 21 U.S.C.section 360bbb-3(b)(1), unless the authorization is terminated  or revoked sooner.       Influenza A by PCR NEGATIVE NEGATIVE Final   Influenza B by PCR NEGATIVE NEGATIVE Final    Comment: (NOTE) The Xpert Xpress SARS-CoV-2/FLU/RSV plus assay is intended as an aid in the diagnosis of influenza from Nasopharyngeal swab specimens and should not be used as a sole basis for treatment. Nasal washings and aspirates are unacceptable for Xpert Xpress SARS-CoV-2/FLU/RSV testing.  Fact Sheet for Patients: EntrepreneurPulse.com.au  Fact Sheet for Healthcare Providers: IncredibleEmployment.be  This test is not yet approved or cleared by the [place] FDA and has been authorized for detection and/or diagnosis of SARS-CoV-2 by FDA under an Emergency Use Authorization (EUA). This EUA will remain in effect (meaning this test can be used) for the duration of the COVID-19 declaration under Section 564(b)(1) of the Act, 21 U.S.C. section 360bbb-3(b)(1), unless the authorization is terminated or revoked.  Performed at [facility], [address] [place], [place] [address]   Culture, blood (routine x 2)     Status: None (Preliminary result)   Collection Time: 07/21/21  8:15 PM   Specimen: Left Antecubital; Blood  Result Value Ref Range Status   Specimen Description LEFT ANTECUBITAL  Final   Special Requests   Final    BOTTLES DRAWN AEROBIC AND ANAEROBIC Blood Culture adequate volume   Culture   Final    NO GROWTH < 24 HOURS Performed at [facility], [address], [place], [place] [address]    Report Status PENDING  Incomplete      Radiology Studies: DG Chest 2 View  Result  Date: 07/21/2021 CLINICAL DATA:  Shortness of breath.  History of COPD. EXAM: CHEST - 2 VIEW COMPARISON:  10/19/2019 FINDINGS: Hyperinflation. Patient rotated right on the frontal. Mild cardiomegaly. Atherosclerosis in the transverse aorta. No pleural effusion or pneumothorax. Moderate pulmonary interstitial thickening. Increased retrocardiac density on the lateral view is favored to correspond to increased right base density on the frontal  radiograph. Osteopenia. IMPRESSION: Retrocardiac airspace disease, likely within the right lower lobe, most consistent with pneumonia. Followup PA and lateral chest X-ray is recommended in 3-4 weeks following trial of antibiotic therapy to ensure resolution and exclude underlying malignancy. Hyperinflation and interstitial thickening, consistent with the clinical history of COPD/chronic bronchitis. Electronically Signed   By: Abigail Miyamoto M.D.   On: 07/21/2021 18:01   ECHOCARDIOGRAM COMPLETE  Result Date: 07/22/2021    ECHOCARDIOGRAM REPORT   Patient Name:   Ashley Houston Date of Exam: 07/22/2021 Medical Rec #:  [id number]        Height:       66.0 in Accession #:    [id number]       Weight:       130.7 lb Date of Birth:  Sep 19, 1940         BSA:          1.669 m Patient Age:    89 years         BP:           110/54 mmHg Patient Gender: F                HR:           94 bpm. Exam Location:  [facility] Procedure: 2D Echo, Cardiac Doppler and Color Doppler Indications:    CHF  History:        Patient has prior history of Echocardiogram examinations, most                 recent 10/20/2019. CHF, COPD, Signs/Symptoms:Syncope; Risk                 Factors:Current Smoker, Hypertension and Dyslipidemia.  Sonographer:    Wenda Low Referring Phys: [id number] OLADAPO ADEFESO IMPRESSIONS  1. Aortic valve is not visualized on short axis view, however, doppler interrogation suggests severe aortic stenosis with AVA 0.75cm2, mean gradient 28mmHg, Vmax 70m/s, and DI 0.21. This has been noted  on previous echoes as well.  2. Left ventricular ejection fraction, by estimation, is 60 to 65%. The left ventricle has normal function. The left ventricle has no regional wall motion abnormalities. There is mild concentric left ventricular hypertrophy. Left ventricular diastolic parameters are consistent with Grade I diastolic dysfunction (impaired relaxation).  3. Right ventricular systolic function is normal. The right ventricular size is normal. Tricuspid regurgitation signal is inadequate for assessing PA pressure.  4. Left atrial size was moderately dilated.  5. The mitral valve is grossly normal. Trivial mitral valve regurgitation.  6. The aortic valve was not well visualized. Aortic valve regurgitation is not visualized. Severe aortic valve stenosis.  7. The inferior vena cava is dilated in size with >50% respiratory variability, suggesting right atrial pressure of 8 mmHg. Comparison(s): There continues to be severe aortic stenosis (prior mean gradient 77mmHg). FINDINGS  Left Ventricle: Left ventricular ejection fraction, by estimation, is 60 to 65%. The left ventricle has normal function. The left ventricle has no regional wall motion abnormalities. The left ventricular internal cavity size was normal in size. There is  mild concentric left ventricular hypertrophy. Left ventricular diastolic parameters are consistent with Grade I diastolic dysfunction (impaired relaxation). Right Ventricle: The right ventricular size is normal. No increase in right ventricular wall thickness. Right ventricular systolic function is normal. Tricuspid regurgitation signal is inadequate for assessing PA pressure. Left Atrium: Left atrial size was moderately dilated. Right Atrium: Right atrial size was normal in size. Pericardium: There is no evidence  of pericardial effusion. Mitral Valve: The mitral valve is grossly normal. There is mild thickening of the mitral valve leaflet(s). There is mild calcification of the mitral valve  leaflet(s). Mild to moderate mitral annular calcification. Trivial mitral valve regurgitation. MV peak gradient, 7.3 mmHg. The mean mitral valve gradient is 3.0 mmHg. Tricuspid Valve: The tricuspid valve is normal in structure. Tricuspid valve regurgitation is trivial. Aortic Valve: Aortic valve is not visualized on short axis view, however, doppler interrogation suggests possible severe aortic stenosis with AVA 0.75cm2, mean gradient 47mmHg, Vmax 31m/s, and DI 0.21. Recommend TEE for further evaluation. The aortic valve was not well visualized. Aortic valve regurgitation is trivial. Severe aortic stenosis is present. Aortic valve mean gradient measures 58.7 mmHg. Aortic valve peak gradient measures 97.7 mmHg. Aortic valve area, by VTI measures 0.70 cm. Pulmonic Valve: The pulmonic valve was not well visualized. Aorta: The aortic root was not well visualized. Venous: The inferior vena cava is dilated in size with greater than 50% respiratory variability, suggesting right atrial pressure of 8 mmHg. IAS/Shunts: The atrial septum is grossly normal.  LEFT VENTRICLE PLAX 2D LVIDd:         4.00 cm     Diastology LVIDs:         2.80 cm     LV e' medial:    7.40 cm/s LV PW:         1.20 cm     LV E/e' medial:  14.9 LV IVS:        1.50 cm     LV e' lateral:   9.46 cm/s LVOT diam:     2.00 cm     LV E/e' lateral: 11.6 LV SV:         76 LV SV Index:   46 LVOT Area:     3.14 cm  LV Volumes (MOD) LV vol d, MOD A2C: 93.6 ml LV vol d, MOD A4C: 71.0 ml LV vol s, MOD A2C: 32.9 ml LV vol s, MOD A4C: 34.0 ml LV SV MOD A2C:     60.7 ml LV SV MOD A4C:     71.0 ml LV SV MOD BP:      49.6 ml RIGHT VENTRICLE RV Basal diam:  3.55 cm RV S prime:     13.80 cm/s TAPSE (M-mode): 2.1 cm LEFT ATRIUM             Index        RIGHT ATRIUM           Index LA diam:        3.40 cm 2.04 cm/m   RA Area:     17.70 cm LA Vol (A2C):   89.6 ml 53.68 ml/m  RA Volume:   52.80 ml  31.63 ml/m LA Vol (A4C):   58.1 ml 34.81 ml/m LA Biplane Vol: 72.1 ml  43.19 ml/m  AORTIC VALVE AV Area (Vmax):    0.59 cm AV Area (Vmean):   0.54 cm AV Area (VTI):     0.70 cm AV Vmax:           494.33 cm/s AV Vmean:          354.000 cm/s AV VTI:            1.094 m AV Peak Grad:      97.7 mmHg AV Mean Grad:      58.7 mmHg LVOT Vmax:         93.40 cm/s LVOT Vmean:  60.500 cm/s LVOT VTI:          0.242 m LVOT/AV VTI ratio: 0.22  AORTA Ao Root diam: 3.60 cm MITRAL VALVE MV Area (PHT): 4.21 cm     SHUNTS MV Area VTI:   3.32 cm     Systemic VTI:  0.24 m MV Peak grad:  7.3 mmHg     Systemic Diam: 2.00 cm MV Mean grad:  3.0 mmHg MV Vmax:       1.35 m/s MV Vmean:      72.9 cm/s MV Decel Time: 180 msec MV E velocity: 110.00 cm/s MV A velocity: 140.00 cm/s MV E/A ratio:  0.79 Ashley Kaufman MD Electronically signed by Ashley Kaufman MD Signature Date/Time: 07/22/2021/5:39:26 PM    Final      Scheduled Meds:  dextromethorphan-guaiFENesin  1 tablet Oral BID   enoxaparin (LOVENOX) injection  40 mg Subcutaneous Q24H   ipratropium-albuterol  3 mL Nebulization TID   Continuous Infusions:  azithromycin 500 mg (07/22/21 0916)   cefTRIAXone (ROCEPHIN)  IV 1 g (07/22/21 0802)     LOS: 1 day    Ashley Houston M.D on 07/22/2021 at 8:00 PM  Go to [email or web address] - for contact info  [facility] - Office  [phone]  If 7PM-7AM, please contact night-coverage [email or web address] Password [facility] 07/22/2021, 8:00 PM

## 2021-07-22 NOTE — Assessment & Plan Note (Signed)
Patient was hypoxic despite being on home supplemental oxygen at 4 PM, O2 sats was started to range within 83 to 89%.  This improved at 5 LPM ?

## 2021-07-22 NOTE — Assessment & Plan Note (Signed)
BUN/creatinine 31/1.28 (this was 0.8 in June 2021) ?Renally adjust medications, avoid nephrotoxic agents/dehydration/hypotension ? ?

## 2021-07-22 NOTE — Progress Notes (Signed)
Dr. Thomes Dinning notified pt O2 sat remaining 88-89% on 4L. Orders given to titrate up O2 via [facility]. O2 titrated to 5L with O2 sat at 95%. Patient resting comfortably, will continue to monitor through continuous pulse ox.  ?

## 2021-07-22 NOTE — Progress Notes (Signed)
?  Transition of Care (TOC) Screening Note ? ? ?Patient Details  ?Name: Ashley Houston ?Date of Birth: 1941-04-05 ? ? ?Transition of Care (TOC) CM/SW Contact:    ?Leitha Bleak, RN ?Phone Number: ?07/22/2021, 2:33 PM ? ?Follow for home health needs. Currently uses home oxygen. ? ?Transition of Care Department [facility]) has reviewed patient and no TOC needs have been identified at this time. We will continue to monitor patient advancement through interdisciplinary progression rounds. If new patient transition needs arise, please place a TOC consult. ? ? ?

## 2021-07-22 NOTE — Progress Notes (Signed)
*  PRELIMINARY RESULTS* ?Echocardiogram ?2D Echocardiogram has been performed. ? ?Ashley Houston ?07/22/2021, 3:00 PM ?

## 2021-07-23 ENCOUNTER — Telehealth: Payer: Self-pay | Admitting: Registered Nurse

## 2021-07-23 DIAGNOSIS — I350 Nonrheumatic aortic (valve) stenosis: Secondary | ICD-10-CM

## 2021-07-23 LAB — LEGIONELLA PNEUMOPHILA SEROGP 1 UR AG: L. pneumophila Serogp 1 Ur Ag: NEGATIVE

## 2021-07-23 NOTE — Telephone Encounter (Signed)
Daughter wanted to let you know that her mother was in Hospital at [facility], with pneumonia and something going on with her heart. ?

## 2021-07-23 NOTE — Progress Notes (Signed)
?PROGRESS NOTE ? ? ? ? ?Ashley Houston, is a 81 y.o. female, DOB - [birth year], NAT:[id number] ? ?Admit date - 07/21/2021   Admitting Physician Frankey Shown, DO ? ?Outpatient Primary MD for the patient is Lianne Moris, PA-C ? ?LOS - 2 ? ?Chief Complaint  ?Patient presents with  ? Shortness of Breath  ?    ? ? ?Brief Narrative:  ?81 y.o. female with medical history significant of COPD on supplemental oxygen at 2 to 3 LPM, hypothyroidism, chronic diastolic CHF, hyperlipidemia and tobacco abuse and history of severe aortic stenosis with prior syncopal episode admitted on 04/30/2022 with acute on chronic hypoxic respiratory failure secondary to pneumonia ?  ?-Assessment and Plan: ? ?1)CAP (community acquired pneumonia) ?--Continue Rocephin and azithromycin along with bronchodilators and mucolytic's ?-Repeat chest x-ray in a.m. ?-Cough dyspnea and hypoxia persist ? ?2) severe aortic stenosis --- echo from 07/22/2021 noted with doppler  ?interrogation suggests severe aortic stenosis with AVA 0.75cm2, mean gradient , Vmax 44m/s, and DI 0.21. This has been noted on previous  echoes as well.  ?-Cardiology consult appreciated ?-Patient with prior history of syncope ?-outpatient follow-up with structural team for consideration of TAVR advised ?-Avoid hypotension in the setting of severe aortic stenosis ? ?3)HFpEF-possible mild acute on chronic diastolic dysfunction CHF ?Admitted with worsening hypoxia, dyspnea and elevated BNP ?-Suspect most of patient's respiratory problem/dyspnea and hypoxia is probably related to pneumonia and COPD rather than diastolic dysfunction exacerbation ?-Hold off on aggressive diuresis at this time ?-Echo from 07/22/2021 with EF of 65% with severe aortic stenosis ?-Metoprolol on hold due to soft BP ? ?4)AKI (acute kidney injury) (HCC) ?-Creatinine improving ?Renally adjust medications, avoid nephrotoxic agents/dehydration/hypotension ? ?5)Acute on chronic respiratory failure (HCC) ?PTA patient  was on 2 to 3 L at home now requiring up to 5 L due to pneumonia with COPD exacerbation mostly ?-Treat as above #1 ?-Continue to attempt to wean down oxygen a bit ? ?6)Hypothyroidism ?Continue Synthroid ? ?7) COPD and tobacco abuse-- ?-Management as above #1, ?-Solu-Medrol added ?-Patient is not interested in smoking cessation ?-Patient admits to noncompliance with home O2 due to need to smoke ? ?Disposition/Need for in-Hospital Stay- patient unable to be discharged at this time due to --- acute on chronic hypoxic respiratory failure secondary to COPD exacerbation and pneumonia--requiring IV antibiotics steroids and supplemental oxygen ?-Possible discharge home on 07/24/2021 pending clinical reevaluation and repeat chest x-ray --- patient already has home O2 ? ?Status is: Inpatient  ? ?Disposition: The patient is from: Home ?             Anticipated d/c is to: Home ?             Anticipated d/c date is: 1 day ?             Patient currently is not medically stable to d/c. ?Barriers: Not Clinically Stable-  ? ?Code Status :  -  Code Status: Full Code  ? ?Family Communication:    NA (patient is alert, awake and coherent)  ? ?DVT Prophylaxis  :   - SCDs  enoxaparin (LOVENOX) injection 40 mg Start: 07/22/21 0600 ?SCDs Start: 07/22/21 0040 ? ? ?Lab Results  ?Component Value Date  ? PLT 171 07/22/2021  ? ? ?Inpatient Medications ? ?Scheduled Meds: ? dextromethorphan-guaiFENesin  1 tablet Oral BID  ? doxepin  100 mg Oral QHS  ? enoxaparin (LOVENOX) injection  40 mg Subcutaneous Q24H  ? ipratropium-albuterol  3 mL Nebulization TID  ? methylPREDNISolone (SOLU-MEDROL)  injection  40 mg Intravenous Q12H  ? ?Continuous Infusions: ? azithromycin 500 mg (07/23/21 0954)  ? cefTRIAXone (ROCEPHIN)  IV 1 g (07/23/21 0853)  ? ?PRN Meds:.acetaminophen **OR** acetaminophen, acetaminophen, albuterol, ipratropium-albuterol, LORazepam, ondansetron **OR** ondansetron (ZOFRAN) IV, oxyCODONE-acetaminophen ? ? ?Anti-infectives (From admission,  onward)  ? ? Start     Dose/Rate Route Frequency Ordered Stop  ? 07/22/21 1000  cefTRIAXone (ROCEPHIN) 1 g in sodium chloride 0.9 % 100 mL IVPB       ? 1 g ?200 mL/hr over 30 Minutes Intravenous Every 24 hours 07/21/21 2354    ? 07/22/21 1000  azithromycin (ZITHROMAX) 500 mg in sodium chloride 0.9 % 250 mL IVPB       ? 500 mg ?250 mL/hr over 60 Minutes Intravenous Every 24 hours 07/21/21 2354    ? 07/21/21 1945  cefTRIAXone (ROCEPHIN) 1 g in sodium chloride 0.9 % 100 mL IVPB       ? 1 g ?200 mL/hr over 30 Minutes Intravenous  Once 07/21/21 1934 07/22/21 0753  ? 07/21/21 1945  azithromycin (ZITHROMAX) 500 mg in sodium chloride 0.9 % 250 mL IVPB       ? 500 mg ?250 mL/hr over 60 Minutes Intravenous  Once 07/21/21 1934 07/21/21 2042  ? ?  ? ?  ? ?Subjective: ?Ashley Houston today has no fevers, no emesis,  No chest pain,   ?-Continues to cough ?-Continues to be wheezy ?-Dyspnea with minimal exertion persist ?-Dyspnea at rest improving ? ? ?Objective: ?Vitals:  ? 07/23/21 0609 07/23/21 0752 07/23/21 1308 07/23/21 1407  ?BP: 133/65  133/80   ?Pulse: 92  87   ?Resp: 20  17   ?Temp: 98 ?F (36.7 ?C)  99.1 ?F (37.3 ?C)   ?TempSrc: Oral     ?SpO2: 96% 95% 94% 96%  ?Weight:      ?Height:      ? ? ?Intake/Output Summary (Last 24 hours) at 07/23/2021 1721 ?Last data filed at 07/23/2021 1300 ?Gross per 24 hour  ?Intake 1094.34 ml  ?Output 800 ml  ?Net 294.34 ml  ? ?Filed Weights  ? 07/21/21 2314 07/22/21 0634 07/23/21 0500  ?Weight: 59.4 kg 59.3 kg 57.9 kg  ? ? ?Physical Exam ? ?Gen:- Awake Alert, some conversational dyspnea ?HEENT:- [place].AT, No sclera icterus ?Nose- [place] 5L/min ?Neck-Supple Neck,No JVD,.  ?Lungs-diminished breath sounds, scattered wheezes bilaterally  ?CV- S1, S2 normal, regular , 3/6 SM ?Abd-  +ve B.Sounds, Abd Soft, No tenderness,    ?Extremity/Skin:- No edema, pedal pulses present  ?Psych-affect is appropriate, oriented x3 ?Neuro-generalized weakness, no new focal deficits, no tremors ? ?Data Reviewed: I have  personally reviewed following labs and imaging studies ? ?CBC: ?Recent Labs  ?Lab 07/21/21 ?1836 07/22/21 ?0501  ?WBC 13.6* 19.4*  ?HGB 13.0 11.7*  ?HCT 41.2 37.6  ?MCV 94.5 92.6  ?PLT 178 171  ? ?Basic Metabolic Panel: ?Recent Labs  ?Lab 07/21/21 ?1836 07/22/21 ?0501  ?NA 138 139  ?K 4.4 4.2  ?CL 95* 100  ?CO2 31 29  ?GLUCOSE 107* 160*  ?BUN 31* 30*  ?CREATININE 1.28* 1.02*  ?CALCIUM 9.8 9.6  ?MG  --  2.5*  ?PHOS  --  3.5  ? ?GFR: ?Estimated Creatinine Clearance: 40.9 mL/min (A) (by C-G formula based on SCr of 1.02 mg/dL (H)). ?Liver Function Tests: ?Recent Labs  ?Lab 07/22/21 ?0501  ?AST 18  ?ALT 17  ?ALKPHOS 81  ?BILITOT 0.4  ?PROT 6.9  ?ALBUMIN 3.6  ? ?Cardiac Enzymes: ?No results for input(s):  CKTOTAL, CKMB, CKMBINDEX, TROPONINI in the last 168 hours. ?BNP (last 3 results) ?No results for input(s): PROBNP in the last 8760 hours. ?HbA1C: ?No results for input(s): HGBA1C in the last 72 hours. ?Sepsis Labs: ?@LABRCNTIP (procalcitonin:4,lacticidven:4) ?) ?Recent Results (from the past 240 hour(s))  ?Culture, blood (routine x 2)     Status: None (Preliminary result)  ? Collection Time: 07/21/21  6:37 PM  ? Specimen: BLOOD LEFT HAND  ?Result Value Ref Range Status  ? Specimen Description BLOOD LEFT HAND BOTTLES DRAWN AEROBIC ONLY  Final  ? Special Requests Blood Culture adequate volume  Final  ? Culture   Final  ?  NO GROWTH < 24 HOURS ?Performed at [facility], [address], [place], [place] ?  ? Report Status PENDING  Incomplete  ?Resp Panel by RT-PCR (Flu A&B, Covid) Nasopharyngeal Swab     Status: None  ? Collection Time: 07/21/21  6:50 PM  ? Specimen: Nasopharyngeal Swab; Nasopharyngeal(NP) swabs in vial transport medium  ?Result Value Ref Range Status  ? SARS Coronavirus 2 by RT PCR NEGATIVE NEGATIVE Final  ?  Comment: (NOTE) ?SARS-CoV-2 target nucleic acids are NOT DETECTED. ? ?The SARS-CoV-2 RNA is generally detectable in upper respiratory ?specimens during the acute phase of infection. The  lowest ?concentration of SARS-CoV-2 viral copies this assay can detect is ?138 copies/mL. A negative result does not preclude SARS-Cov-2 ?infection and should not be used as the sole basis for treatment or ?oth

## 2021-07-23 NOTE — Consult Note (Addendum)
?Cardiology Consultation:  ? ?Patient ID: Ashley Houston ?MRN: [id number]; DOB: 01/14/1941 ? ?Admit date: 07/21/2021 ?Date of Consult: 07/23/2021 ? ?PCP:  Lanelle Bal, PA-C ?  ?[facility] Providers ?Cardiologist:  None Nishan ? ? ?Patient Profile:  ? ?Ashley Houston is a 81 y.o. female with a hx of COPD on 2-3 LO2 at home, tobacco use, hypothyroidism, HFpEF, HLD, AS who is being seen 07/23/2021 for the evaluation of severe AS at the request of Dr. Denton Brick. ? ?History of Present Illness:  ? ?Ms. [name] was seen by Dr. Johnsie Cancel in 2021 for syncope. Echo showed LVEF 60-65% and critical AS with peak gradient 30mmHg AVA 0.52cm2, DVI 0.16. She was recommended TAVR evaluation but was lost to follow-up.  ? ?She smokes 1ppd. Denies alcohol or drug use.  ? ?The patient was recently admitted for PNA. She reported low energy for the last days. Aparently was also gray [employer]. Reported chronic SOB. No chest pain, LLE, orthopnea, pnd. She denies syncope since 2021.  ? ?In the ER BP 111/56, HR 89bpm, O2 83-89% on 4L O2. CBC showed leukocytosis. Scr 1.28, BUN 31. BNP 136. HS trop negative x 1. CXR showed PNA. She was started on IV abx and admitted. B ? ?Echo showed LVEF 60-65% with severe AS and cardiology was asked to see.  ? ? ? ?Past Medical History:  ?Diagnosis Date  ? Anxiety   ? Chronic diastolic heart failure ([place])   ? Chronic pain   ? hips back and shoulders  ? COPD (chronic obstructive pulmonary disease) ([place])   ? Depression   ? Hypothyroidism 09/08/2015  ? Tachycardia   ? Thyroid disease   ? ? ?Past Surgical History:  ?Procedure Laterality Date  ? ABDOMINAL HYSTERECTOMY    ? CHOLECYSTECTOMY    ? TONSILLECTOMY    ?  ? ?Home Medications:  ?Prior to Admission medications   ?Medication Sig Start Date End Date Taking? Authorizing Provider  ?acetaminophen (TYLENOL) 500 MG tablet Take 500 mg by mouth every 6 (six) hours as needed.   Yes [provider]  ?albuterol (VENTOLIN HFA) 108 (90 Base) MCG/ACT inhaler Inhale  2 puffs into the lungs every 6 (six) hours as needed for wheezing or shortness of breath.  10/03/19  Yes [provider]  ?ASPIRIN ADULT LOW STRENGTH 81 MG EC tablet Take 81 mg by mouth in the morning.  06/18/17  Yes [provider]  ?Calcium Carbonate-Vitamin D (CALCARB 600/D PO) Take 1 tablet by mouth in the morning.   Yes [provider]  ?diclofenac sodium (VOLTAREN) 1 % GEL Apply 1 application topically 3 (three) times daily. Left knee ?Patient taking differently: Apply 1 application. topically 3 (three) times daily as needed. Left knee/hip 04/19/18  Yes Meredith Staggers, MD  ?doxepin (SINEQUAN) 100 MG capsule Take 100 mg by mouth at bedtime.  07/31/15  Yes [provider]  ?Ferrous Sulfate (IRON) 325 (65 Fe) MG TABS Take 325 mg by mouth daily.  04/24/17  Yes [provider]  ?fluticasone (FLONASE) 50 MCG/ACT nasal spray Place into both nostrils daily.   Yes [provider]  ?gabapentin (NEURONTIN) 300 MG capsule Take 300 mg by mouth 3 (three) times daily. 06/18/17  Yes [provider]  ?ipratropium-albuterol (DUONEB) 0.5-2.5 (3) MG/3ML SOLN Take 3 mLs by nebulization 3 (three) times daily. ?Patient taking differently: Take 3 mLs by nebulization every 6 (six) hours as needed (for shortness of breath). 10/18/15  Yes Reyne Dumas, MD  ?levothyroxine (SYNTHROID) 50 MCG  tablet Take 50 mcg by mouth daily before breakfast.  10/03/19  Yes [provider]  ?LORazepam (ATIVAN) 2 MG tablet Take 2 mg by mouth 3 (three) times daily. 10/25/20  Yes [provider]  ?metoprolol tartrate (LOPRESSOR) 25 MG tablet Take 12.5 mg by mouth in the morning.  10/03/19  Yes [provider]  ?Multiple Vitamin (MULTIVITAMIN WITH MINERALS) TABS tablet Take 1 tablet by mouth 2 (two) times daily.   Yes [provider]  ?oxyCODONE-acetaminophen (PERCOCET) 10-325 MG tablet Take 1 tablet by mouth every 8 (eight) hours as needed for pain. Do Not Fill Before  07/04/2021 07/01/21  Yes Bayard Hugger, NP  ?simvastatin (ZOCOR) 20 MG tablet Take 20 mg by mouth at bedtime. 06/18/17  Yes [provider]  ?metoprolol tartrate (LOPRESSOR) 50 MG tablet TAKE 1 TABLET BY MOUTH 2 HOURS PRIOR TO THE CARDIAC CT ?Patient not taking: Reported on 07/21/2021 10/26/19   Josue Hector, MD  ? ? ?Inpatient Medications: ?Scheduled Meds: ? dextromethorphan-guaiFENesin  1 tablet Oral BID  ? doxepin  100 mg Oral QHS  ? enoxaparin (LOVENOX) injection  40 mg Subcutaneous Q24H  ? ipratropium-albuterol  3 mL Nebulization TID  ? methylPREDNISolone (SOLU-MEDROL) injection  40 mg Intravenous Q12H  ? ?Continuous Infusions: ? azithromycin 500 mg (07/22/21 0916)  ? cefTRIAXone (ROCEPHIN)  IV 1 g (07/22/21 0802)  ? ?PRN Meds: ?acetaminophen **OR** acetaminophen, acetaminophen, albuterol, ipratropium-albuterol, LORazepam, ondansetron **OR** ondansetron (ZOFRAN) IV, oxyCODONE-acetaminophen ? ?Allergies:    ?Allergies  ?Allergen Reactions  ? Lipitor [Atorvastatin] Itching  ? ? ?Social History:   ?Social History  ? ?Socioeconomic History  ? Marital status: Widowed  ?  Spouse name: Not on file  ? Number of children: Not on file  ? Years of education: Not on file  ? Highest education level: Not on file  ?Occupational History  ? Not on file  ?Tobacco Use  ? Smoking status: Every Day  ?  Packs/day: 0.50  ?  Types: Cigarettes  ? Smokeless tobacco: Never  ?Vaping Use  ? Vaping Use: Former  ?Substance and Sexual Activity  ? Alcohol use: No  ? Drug use: No  ? Sexual activity: Not on file  ?Other Topics Concern  ? Not on file  ?Social History Narrative  ? Not on file  ? ?Social Determinants of Health  ? ?Financial Resource Strain: Not on file  ?Food Insecurity: Not on file  ?Transportation Needs: Not on file  ?Physical Activity: Not on file  ?Stress: Not on file  ?Social Connections: Not on file  ?Intimate Partner Violence: Not on file  ?  ?Family History:   ?History reviewed. No pertinent family history.   ? ?ROS:  ?Please see the history of present illness.  ? ?All other ROS reviewed and negative.    ? ?Physical Exam/Data:  ? ?Vitals:  ? 07/23/21 0429 07/23/21 0500 07/23/21 0609 07/23/21 0752  ?BP:   133/65   ?Pulse:   92   ?Resp:   20   ?Temp:   98 ?F (36.7 ?C)   ?TempSrc:   Oral   ?SpO2: 92%  96% 95%  ?Weight:  57.9 kg    ?Height:      ? ? ?Intake/Output Summary (Last 24 hours) at 07/23/2021 0808 ?Last data filed at 07/22/2021 1741 ?Gross per 24 hour  ?Intake 1070 ml  ?Output 800 ml  ?Net 270 ml  ? ?Last 3 Weights 07/23/2021 07/22/2021 07/21/2021  ?Weight (lbs) 127 lb 11.2 oz 130 lb  11.7 oz 130 lb 15.3 oz  ?Weight (kg) 57.924 kg 59.3 kg 59.4 kg  ?   ?Body mass index is 20.61 kg/m?.  ?General:  frail elderly female ?HEENT: normal ?Neck: no JVD ?Vascular: No carotid bruits; Distal pulses 2+ bilaterally ?Cardiac:  normal S1, S2; RRR; + murmur  ?Lungs:  diffusely diminished ?Abd: soft, nontender, no hepatomegaly  ?Ext: no edema ?Musculoskeletal:  No deformities, BUE and BLE strength normal and equal ?Skin: warm and dry  ?Neuro:  CNs 2-12 intact, no focal abnormalities noted ?Psych:  Normal affect  ? ?EKG:  The EKG was personally reviewed and demonstrates:  NSR 88bpm, 1st degree AV block, PRI 261ms, LAE, nonspecific ST changes ?Telemetry:  Telemetry was personally reviewed and demonstrates:  NSR, HR 90-105, PVCs ? ?Relevant CV Studies: ? ?Echo 07/23/21 ? 1. Aortic valve is not visualized on short axis view, however, doppler  ?interrogation suggests severe aortic stenosis with AVA 0.75cm2, mean  ?gradient 93mmHg, Vmax 38m/s, and DI 0.21. This has been noted on previous  ?echoes as well.  ? 2. Left ventricular ejection fraction, by estimation, is 60 to 65%. The  ?left ventricle has normal function. The left ventricle has no regional  ?wall motion abnormalities. There is mild concentric left ventricular  ?hypertrophy. Left ventricular diastolic  ?parameters are consistent with Grade I diastolic dysfunction (impaired   ?relaxation).  ? 3. Right ventricular systolic function is normal. The right ventricular  ?size is normal. Tricuspid regurgitation signal is inadequate for assessing  ?PA pressure.  ? 4. Left atrial size was moderate

## 2021-07-24 ENCOUNTER — Inpatient Hospital Stay (HOSPITAL_COMMUNITY): Payer: Medicare Other

## 2021-07-24 ENCOUNTER — Encounter: Payer: Medicare Other | Admitting: Registered Nurse

## 2021-07-24 DIAGNOSIS — I350 Nonrheumatic aortic (valve) stenosis: Secondary | ICD-10-CM

## 2021-07-24 DIAGNOSIS — J441 Chronic obstructive pulmonary disease with (acute) exacerbation: Secondary | ICD-10-CM

## 2021-07-24 LAB — CBC
HCT: 38.8 % (ref 36.0–46.0)
Hemoglobin: 12.1 g/dL (ref 12.0–15.0)
MCH: 28.5 pg (ref 26.0–34.0)
MCHC: 31.2 g/dL (ref 30.0–36.0)
MCV: 91.5 fL (ref 80.0–100.0)
Platelets: 214 10*3/uL (ref 150–400)
RBC: 4.24 MIL/uL (ref 3.87–5.11)
RDW: 17.5 % — ABNORMAL HIGH (ref 11.5–15.5)
WBC: 10.3 10*3/uL (ref 4.0–10.5)
nRBC: 0 % (ref 0.0–0.2)

## 2021-07-24 LAB — BASIC METABOLIC PANEL
Anion gap: 10 (ref 5–15)
BUN: 30 mg/dL — ABNORMAL HIGH (ref 8–23)
CO2: 27 mmol/L (ref 22–32)
Calcium: 9.2 mg/dL (ref 8.9–10.3)
Chloride: 103 mmol/L (ref 98–111)
Creatinine, Ser: 0.79 mg/dL (ref 0.44–1.00)
GFR, Estimated: >60 mL/min (ref >60)
Glucose, Bld: 118 mg/dL — ABNORMAL HIGH (ref 70–99)
Potassium: 4.1 mmol/L (ref 3.5–5.1)
Sodium: 140 mmol/L (ref 135–145)

## 2021-07-24 MED ORDER — DM-GUAIFENESIN ER 30-600 MG PO TB12
1.0000 | ORAL_TABLET | Freq: Two times a day (BID) | ORAL | 0 refills | Status: AC | PRN
Start: 2021-07-24 — End: 2021-07-29

## 2021-07-24 MED ORDER — METOPROLOL TARTRATE 25 MG PO TABS: 12.5000 mg | ORAL_TABLET | Freq: Two times a day (BID) | ORAL | Status: AC

## 2021-07-24 MED ORDER — IPRATROPIUM-ALBUTEROL 0.5-2.5 (3) MG/3ML IN SOLN: 3.0000 mL | Freq: Four times a day (QID) | RESPIRATORY_TRACT | Status: AC | PRN

## 2021-07-24 MED ORDER — PREDNISONE 20 MG PO TABS: ORAL_TABLET | ORAL | 0 refills | Status: AC

## 2021-07-24 MED ORDER — DOXYCYCLINE HYCLATE 100 MG PO CAPS: 100.0000 mg | ORAL_CAPSULE | Freq: Two times a day (BID) | ORAL | 0 refills | Status: AC

## 2021-07-24 MED ORDER — IPRATROPIUM-ALBUTEROL 0.5-2.5 (3) MG/3ML IN SOLN: 3.0000 mL | Freq: Two times a day (BID) | RESPIRATORY_TRACT | Status: DC

## 2021-07-24 MED ORDER — DICLOFENAC SODIUM 1 % TD GEL: 1.0000 "application " | Freq: Three times a day (TID) | TRANSDERMAL | Status: AC | PRN

## 2021-07-24 NOTE — Progress Notes (Signed)
Patient discharged home with personal belongings , IV removed from RFA. Reviewed discharge summary and medication instructions with patient and daughter at bedside, Verbalized understanding, Patient transferred via w/c and her daughter picked her up without incidence ? ?

## 2021-07-24 NOTE — Progress Notes (Incomplete)
{  [facility]_TRH_Note:[id number]} 

## 2021-07-24 NOTE — Discharge Summary (Addendum)
Physician Discharge Summary  ?Ashley Houston [id number] DOB: 1940/07/27 DOA: 07/21/2021 ? ?PCP: Lanelle Bal, PA-C ? ?Admit date: 07/21/2021 ?Discharge date: 07/24/2021 ? ?Admitted From:  Home  ?Disposition: Home  ? ?Recommendations for Outpatient Follow-up:  ?Follow up with PCP in 1 weeks ?Follow up with cardiology as scheduled regarding valve  ?Please obtain CXR in 4 weeks to ensure full resolution on pneumonia  ?Please obtain BMP/CBC in one week ? ?Discharge Condition: STABLE   ?CODE STATUS: FULL  ?DIET:  resume prior home diet   ? ?Brief Hospitalization Summary: ?Please see all hospital notes, images, labs for full details of the hospitalization. ?ADMISSION HPI:  Ashley Houston is a 81 y.o. female with medical history significant of COPD on supplemental oxygen at 2 LPM, hypothyroidism, chronic diastolic CHF, hyperlipidemia and tobacco abuse who presents to the emergency department due to increased shortness of breath which started this morning.  Patient was sleepy at bedside, though was easily aroused, bowel she was normal unable to provide any history.  History was obtained from ED physician and ED medical record.  Per report, patient was supposed to be on supplemental oxygen 24/7.  She woke up this morning feeling tired, so she went back to bed, she was still having shortness of breath and cough which was nonproductive, so she decided to go to the ED for further evaluation and management.  On arrival to the ED O2 sat was 65%.  There was no report of fever, chills, chest pain, nausea, vomiting, abdominal pain, ? ?ED course: ?The emergency department, she was tachypneic, BP was 111/56, HR 89 bpm, P with O2 sat of 83 to 89% on 4 LPM of oxygen.  Work-up in the ED showed normal CBC except for leukocytosis, BMP showed BUN/creatinine 31/1.28 (this was 0.8 in June 2021), BNP 136, troponin x1 was negative.  Influenza A, B, SARS coronavirus 2 was negative.  Chest x-ray showed retrocardiac airspace disease, likely  within the right lower lobe, most consistent with pneumonia.  She was treated with IV ceftriaxone and azithromycin, breathing treatment was provided. Magnesium and solu-medrol was provided.  Hospitalist was asked to admit patient for further evaluation and management. ? ?HOSPITAL COURSE BY PROBLEM LIST ? ?1)CAP (community acquired pneumonia) ?--Treated with IV Rocephin and azithromycin along with bronchodilators and mucolytics ?-Repeat chest x-ray shows improvement, recheck in 4 weeks to ensure full resolution of pneumonia.  ?-Cough dyspnea and hypoxia improving.  DC home on oral doxycycline 100 mg BID.  ?  ?2) severe aortic stenosis --- echo from 07/22/2021 noted with doppler  ?interrogation suggests severe aortic stenosis with AVA 0.75cm2, mean gradient 30mmHg, Vmax 29m/s, and DI 0.21. This has been noted on previous  echoes as well.  ?-Cardiology consult appreciated ?-Patient with prior history of syncope ?-outpatient follow-up with structural team for consideration of TAVR advised ?-Avoid hypotension in the setting of severe aortic stenosis ?-cardiology team arranged for outpatient follow up at structural heart clinic  ?  ?3)HFpEF-possible mild acute on chronic diastolic dysfunction CHF ?Admitted with worsening hypoxia, dyspnea and elevated BNP ?-Suspect most of patient's respiratory problem/dyspnea and hypoxia is probably related to pneumonia and COPD rather than diastolic dysfunction exacerbation ?-Hold off on aggressive diuresis at this time ?-Echo from 07/22/2021 with EF of 65% with severe aortic stenosis ?-Metoprolol resumed at discharge.  ?  ?4)AKI (acute kidney injury) ([place]) ?-Creatinine improving ?Renally adjust medications, avoid nephrotoxic agents/dehydration/hypotension ?  ?5)Acute on chronic respiratory failure ([place]) ?PTA patient was on 2 to 3 L at home  now requiring up to 5 L due to pneumonia with COPD exacerbation mostly ?-Treat as above #1 ?-Continue to attempt to wean down oxygen a bit ?   ?6)Hypothyroidism ?Continue Synthroid ?  ?7) COPD and tobacco abuse-- ?-Management as above #1, ?-Solu-Medrol added ?-Patient is not interested in smoking cessation ?-Patient admits to noncompliance with home O2 due to need to smoke ?  ?Discharge Diagnoses:  ?Principal Problem: ?  CAP (community acquired pneumonia) ?Active Problems: ?  COPD (chronic obstructive pulmonary disease) with emphysema ([place]) ?  Tobacco abuse ?  COPD exacerbation ([place]) ?  Chronic diastolic heart failure ([place]) ?  Hypothyroidism ?  Acute on chronic respiratory failure ([place]) ?  AKI (acute kidney injury) ([place]) ?  Elevated brain natriuretic peptide (BNP) level ?  Hyperlipidemia ?  Severe aortic valve stenosis ? ?Discharge Instructions: ? ?Allergies as of 07/24/2021   ? ?   Reactions  ? Lipitor [atorvastatin] Itching  ? ?  ? ?  ?Medication List  ?  ? ?TAKE these medications   ? ?acetaminophen 500 MG tablet ?Commonly known as: TYLENOL ?Take 500 mg by mouth every 6 (six) hours as needed. ?  ?albuterol 108 (90 Base) MCG/ACT inhaler ?Commonly known as: VENTOLIN HFA ?Inhale 2 puffs into the lungs every 6 (six) hours as needed for wheezing or shortness of breath. ?  ?Aspirin Adult Low Strength 81 MG EC tablet ?Generic drug: aspirin ?Take 81 mg by mouth in the morning. ?  ?CALCARB 600/D PO ?Take 1 tablet by mouth in the morning. ?  ?dextromethorphan-guaiFENesin 30-600 MG 12hr tablet ?Commonly known as: Nolan DM ?Take 1 tablet by mouth 2 (two) times daily as needed for up to 5 days for cough. ?  ?diclofenac sodium 1 % Gel ?Commonly known as: VOLTAREN ?Apply 1 application. topically 3 (three) times daily as needed. Left knee/hip ?  ?doxepin 100 MG capsule ?Commonly known as: SINEQUAN ?Take 100 mg by mouth at bedtime. ?  ?doxycycline 100 MG capsule ?Commonly known as: VIBRAMYCIN ?Take 1 capsule (100 mg total) by mouth 2 (two) times daily for 3 days. ?  ?fluticasone 50 MCG/ACT nasal spray ?Commonly known as: FLONASE ?Place into both nostrils daily. ?   ?gabapentin 300 MG capsule ?Commonly known as: NEURONTIN ?Take 300 mg by mouth 3 (three) times daily. ?  ?ipratropium-albuterol 0.5-2.5 (3) MG/3ML Soln ?Commonly known as: DUONEB ?Take 3 mLs by nebulization every 6 (six) hours as needed (for shortness of breath). ?  ?Iron 325 (65 Fe) MG Tabs ?Take 325 mg by mouth daily. ?  ?levothyroxine 50 MCG tablet ?Commonly known as: SYNTHROID ?Take 50 mcg by mouth daily before breakfast. ?  ?LORazepam 2 MG tablet ?Commonly known as: ATIVAN ?Take 2 mg by mouth 3 (three) times daily. ?  ?metoprolol tartrate 25 MG tablet ?Commonly known as: LOPRESSOR ?Take 0.5 tablets (12.5 mg total) by mouth 2 (two) times daily. ?What changed:  ?when to take this ?Another medication with the same name was removed. Continue taking this medication, and follow the directions you see here. ?  ?multivitamin with minerals Tabs tablet ?Take 1 tablet by mouth 2 (two) times daily. ?  ?oxyCODONE-acetaminophen 10-325 MG tablet ?Commonly known as: Percocet ?Take 1 tablet by mouth every 8 (eight) hours as needed for pain. Do Not Fill Before 07/04/2021 ?  ?predniSONE 20 MG tablet ?Commonly known as: DELTASONE ?Take 3 PO QAM x3days, 2 PO QAM x3days, 1 PO QAM x3days ?Start taking on: July 25, 2021 ?  ?simvastatin 20 MG tablet ?Commonly  known as: ZOCOR ?Take 20 mg by mouth at bedtime. ?  ? ?  ? ? Follow-up Information   ? ? Lanelle Bal, PA-C. Schedule an appointment as soon as possible for a visit in 2 week(s).   ?Specialty: Family Medicine ?Why: Hospital Follow Up ?Contact information: ?[address] ?[place] [address] ?[phone] ? ? ?  ?  ? ?  ?  ? ?  ? ?Allergies  ?Allergen Reactions  ? Lipitor [Atorvastatin] Itching  ? ?Allergies as of 07/24/2021   ? ?   Reactions  ? Lipitor [atorvastatin] Itching  ? ?  ? ?  ?Medication List  ?  ? ?TAKE these medications   ? ?acetaminophen 500 MG tablet ?Commonly known as: TYLENOL ?Take 500 mg by mouth every 6 (six) hours as needed. ?  ?albuterol 108 (90 Base) MCG/ACT  inhaler ?Commonly known as: VENTOLIN HFA ?Inhale 2 puffs into the lungs every 6 (six) hours as needed for wheezing or shortness of breath. ?  ?Aspirin Adult Low Strength 81 MG EC tablet ?Generic drug: aspirin ?Take 81 mg b

## 2021-07-26 LAB — CULTURE, BLOOD (ROUTINE X 2)
Culture: NO GROWTH
Culture: NO GROWTH
Special Requests: ADEQUATE
Special Requests: ADEQUATE

## 2021-07-29 ENCOUNTER — Institutional Professional Consult (permissible substitution): Payer: Medicare Other | Admitting: Internal Medicine

## 2021-07-31 DIAGNOSIS — Z6821 Body mass index (BMI) 21.0-21.9, adult: Secondary | ICD-10-CM | POA: Diagnosis not present

## 2021-07-31 DIAGNOSIS — I10 Essential (primary) hypertension: Secondary | ICD-10-CM | POA: Diagnosis not present

## 2021-07-31 DIAGNOSIS — J449 Chronic obstructive pulmonary disease, unspecified: Secondary | ICD-10-CM | POA: Diagnosis not present

## 2021-07-31 DIAGNOSIS — J189 Pneumonia, unspecified organism: Secondary | ICD-10-CM | POA: Diagnosis not present

## 2021-07-31 DIAGNOSIS — R7989 Other specified abnormal findings of blood chemistry: Secondary | ICD-10-CM | POA: Diagnosis not present

## 2021-08-04 NOTE — Progress Notes (Deleted)
? ? ?Patient ID: ?Ashley Houston ?MRN: [id number] ?DOB/AGE: 79/07/1940 79 y.o. ? ?Primary Care Physician:Carroll, Denny PeonErin, PA-C ?Primary Cardiologist: Charlton HawsNishan, Peter, MD ? ? ?FOCUSED CARDIOVASCULAR PROBLEM LIST:   ?1.  Severe aortic stenosis with an aortic valve area of 0.75 cm chronic, mean gradient of 57 mmHg, V-max of 5 m/s; ejection fraction 60 to 65%; sinus rhythm with first-degree AV block ?2.  COPD on supplemental oxygen ?3.  Ongoing tobacco use ?4.  Hypothyroidism ?5.  Hyperlipidemia ? ? ? ?HISTORY OF PRESENT ILLNESS: ?The patient is a 81 y.o. female with the indicated medical history here for recommendations regarding her severe aortic stenosis.  The patient had previously been seen in 2021 due to syncope and was found to have severe aortic stenosis at that time.  Unfortunately she was lost to follow-up.  She was admitted earlier this month with increasing shortness of breath and was treated for pneumonia.  She was seen by Dr. Shari ProwsPemberton in consultation regarding her severe aortic stenosis and was referred for further evaluation. ? ? ?Past Medical History:  ?Diagnosis Date  ? Anxiety   ? Chronic diastolic heart failure (HCC)   ? Chronic pain   ? hips back and shoulders  ? COPD (chronic obstructive pulmonary disease) (HCC)   ? Depression   ? Hypothyroidism 09/08/2015  ? Tachycardia   ? Thyroid disease   ?  ?Past Surgical History:  ?Procedure Laterality Date  ? ABDOMINAL HYSTERECTOMY    ? CHOLECYSTECTOMY    ? TONSILLECTOMY    ?  ?No family history on file.  ?Social History  ? ?Socioeconomic History  ? Marital status: Widowed  ?  Spouse name: Not on file  ? Number of children: Not on file  ? Years of education: Not on file  ? Highest education level: Not on file  ?Occupational History  ? Not on file  ?Tobacco Use  ? Smoking status: Every Day  ?  Packs/day: 0.50  ?  Types: Cigarettes  ? Smokeless tobacco: Never  ?Vaping Use  ? Vaping Use: Former  ?Substance and Sexual Activity  ? Alcohol use: No  ? Drug use: No  ?  Sexual activity: Not on file  ?Other Topics Concern  ? Not on file  ?Social History Narrative  ? Not on file  ? ?Social Determinants of Health  ? ?Financial Resource Strain: Not on file  ?Food Insecurity: Not on file  ?Transportation Needs: Not on file  ?Physical Activity: Not on file  ?Stress: Not on file  ?Social Connections: Not on file  ?Intimate Partner Violence: Not on file  ?  ? ?Prior to Admission medications   ?Medication Sig Start Date End Date Taking? Authorizing Provider  ?acetaminophen (TYLENOL) 500 MG tablet Take 500 mg by mouth every 6 (six) hours as needed.    [provider]  ?albuterol (VENTOLIN HFA) 108 (90 Base) MCG/ACT inhaler Inhale 2 puffs into the lungs every 6 (six) hours as needed for wheezing or shortness of breath.  10/03/19   [provider]  ?ASPIRIN ADULT LOW STRENGTH 81 MG EC tablet Take 81 mg by mouth in the morning.  06/18/17   [provider]  ?Calcium Carbonate-Vitamin D (CALCARB 600/D PO) Take 1 tablet by mouth in the morning.    [provider]  ?diclofenac sodium (VOLTAREN) 1 % GEL Apply 1 application. topically 3 (three) times daily as needed. Left knee/hip 07/24/21   Johnson, Clanford L, MD  ?doxepin (SINEQUAN) 100 MG capsule Take 100 mg by mouth  at bedtime.  07/31/15   [provider]  ?Ferrous Sulfate (IRON) 325 (65 Fe) MG TABS Take 325 mg by mouth daily.  04/24/17   [provider]  ?fluticasone (FLONASE) 50 MCG/ACT nasal spray Place into both nostrils daily.    [provider]  ?gabapentin (NEURONTIN) 300 MG capsule Take 300 mg by mouth 3 (three) times daily. 06/18/17   [provider]  ?ipratropium-albuterol (DUONEB) 0.5-2.5 (3) MG/3ML SOLN Take 3 mLs by nebulization every 6 (six) hours as needed (for shortness of breath). 07/24/21   Cleora Fleet, MD  ?levothyroxine (SYNTHROID) 50 MCG tablet Take 50 mcg by mouth daily before breakfast.  10/03/19   [provider]  ?LORazepam (ATIVAN) 2 MG  tablet Take 2 mg by mouth 3 (three) times daily. 10/25/20   [provider]  ?metoprolol tartrate (LOPRESSOR) 25 MG tablet Take 0.5 tablets (12.5 mg total) by mouth 2 (two) times daily. 07/24/21   Cleora Fleet, MD  ?Multiple Vitamin (MULTIVITAMIN WITH MINERALS) TABS tablet Take 1 tablet by mouth 2 (two) times daily.    [provider]  ?oxyCODONE-acetaminophen (PERCOCET) 10-325 MG tablet Take 1 tablet by mouth every 8 (eight) hours as needed for pain. Do Not Fill Before 07/04/2021 07/01/21   Jones Bales, NP  ?predniSONE (DELTASONE) 20 MG tablet Take 3 PO QAM x3days, 2 PO QAM x3days, 1 PO QAM x3days 07/25/21   Johnson, Clanford L, MD  ?simvastatin (ZOCOR) 20 MG tablet Take 20 mg by mouth at bedtime. 06/18/17   [provider]  ? ? ?Allergies  ?Allergen Reactions  ? Lipitor [Atorvastatin] Itching  ? ? ?REVIEW OF SYSTEMS:  ?General: no fevers/chills/night sweats ?Eyes: no blurry vision, diplopia, or amaurosis ?ENT: no sore throat or hearing loss ?Resp: no cough, wheezing, or hemoptysis ?CV: no edema or palpitations ?GI: no abdominal pain, nausea, vomiting, diarrhea, or constipation ?GU: no dysuria, frequency, or hematuria ?Skin: no rash ?Neuro: no headache, numbness, tingling, or weakness of extremities ?Musculoskeletal: no joint pain or swelling ?Heme: no bleeding, DVT, or easy bruising ?Endo: no polydipsia or polyuria ? ?There were no vitals taken for this visit. ? ?PHYSICAL EXAM: ?GEN:  AO x 3 in no acute distress ?HEENT: normal ?Dentition: Normal*** ?Neck: JVP normal. +2***carotid upstrokes without bruits. No thyromegaly. ?Lungs: equal expansion, clear bilaterally ?CV: Apex is discrete and nondisplaced, RRR without murmur or gallop*** ?Abd: soft, non-tender, non-distended; no bruit; positive bowel sounds ?Ext: no edema, ecchymoses, or cyanosis ?Vascular: 2+ femoral pulses, 2+ radial pulses       ?Skin: warm and dry without rash ?Neuro: CN II-XII grossly intact; motor and sensory  grossly intact ? ? ? ?DATA AND STUDIES: ? ?EKG: March 2023 sinus rhythm with first-degree AV block ? ?2D ECHO: March 2023 demonstrates severe aortic stenosis with mean gradient of 57 to 64 mmHg with a normal ejection fraction and no significant mitral regurgitation ? ?CARDIAC CATH: Pending ? ?STS RISK CALCULATOR: Pending ? ?NHYA CLASS: *** ? ? ? ?ASSESSMENT AND PLAN:  ? ?Severe aortic valve stenosis ? ?Chronic diastolic heart failure (HCC) ? ?Pulmonary emphysema, unspecified emphysema type (HCC) ? ? ?I have personally reviewed the patients imaging data as summarized above. ? ?I have reviewed the natural history of aortic stenosis with the patient and family members who are present today. We have discussed the limitations of medical therapy and the poor prognosis associated with symptomatic aortic stenosis. We have also reviewed potential treatment options, including palliative medical therapy, conventional surgical aortic  valve replacement, and transcatheter aortic valve replacement. We discussed treatment options in the context of this patient's specific comorbid medical conditions.  ? ?All of the patient's questions were answered today. Will make further recommendations based on the results of studies outlined above.  ? ?Total time spent with patient today *** minutes. This includes reviewing records, evaluating the patient and coordinating care.  ? ?Orbie Pyo, MD  ?08/04/2021 9:26 AM    ?[facility] ?[address] [place], [place], [place]  [address] ?Phone: [phone]; Fax: [phone]  ? ? ? ?

## 2021-08-05 ENCOUNTER — Institutional Professional Consult (permissible substitution): Payer: Medicare Other | Admitting: Internal Medicine

## 2021-08-05 DIAGNOSIS — J439 Emphysema, unspecified: Secondary | ICD-10-CM

## 2021-08-05 DIAGNOSIS — I350 Nonrheumatic aortic (valve) stenosis: Secondary | ICD-10-CM

## 2021-08-05 DIAGNOSIS — I5032 Chronic diastolic (congestive) heart failure: Secondary | ICD-10-CM

## 2021-08-06 ENCOUNTER — Telehealth: Payer: Self-pay | Admitting: Physician Assistant

## 2021-08-06 NOTE — Telephone Encounter (Signed)
?  [facility]   ?MULTIDISCIPLINARY HEART VALVE TEAM ? ?Patient did not show up to her apt yesterday with Dr. Lynnette Caffey. Spoke to her daughter, Sedalia Muta, about her and she said she has had worsening dementia and is not interested in pursuing valve surgery at this time. She has our number to call if she changes her mind and would like to be seen.  ? ? ?Cline Crock PA-C  [facility]  ? ?

## 2021-08-07 ENCOUNTER — Other Ambulatory Visit: Payer: Self-pay

## 2021-08-07 ENCOUNTER — Encounter: Payer: Medicare Other | Attending: Physical Medicine & Rehabilitation | Admitting: Registered Nurse

## 2021-08-07 ENCOUNTER — Encounter: Payer: Self-pay | Admitting: Registered Nurse

## 2021-08-07 VITALS — BP 108/64 | HR 68 | Temp 98.6°F | Ht 66.0 in | Wt 128.0 lb

## 2021-08-07 DIAGNOSIS — M1712 Unilateral primary osteoarthritis, left knee: Secondary | ICD-10-CM | POA: Diagnosis not present

## 2021-08-07 DIAGNOSIS — Z5181 Encounter for therapeutic drug level monitoring: Secondary | ICD-10-CM | POA: Insufficient documentation

## 2021-08-07 DIAGNOSIS — G894 Chronic pain syndrome: Secondary | ICD-10-CM | POA: Insufficient documentation

## 2021-08-07 DIAGNOSIS — Z79891 Long term (current) use of opiate analgesic: Secondary | ICD-10-CM | POA: Diagnosis not present

## 2021-08-07 DIAGNOSIS — M5416 Radiculopathy, lumbar region: Secondary | ICD-10-CM | POA: Insufficient documentation

## 2021-08-07 DIAGNOSIS — M5136 Other intervertebral disc degeneration, lumbar region: Secondary | ICD-10-CM | POA: Diagnosis not present

## 2021-08-07 LAB — UNLABELED: Test Ordered On Req: 9397693975

## 2021-08-07 MED ORDER — OXYCODONE-ACETAMINOPHEN 10-325 MG PO TABS: 1.0000 | ORAL_TABLET | Freq: Three times a day (TID) | ORAL | 0 refills | Status: AC | PRN

## 2021-08-07 MED ORDER — OXYCODONE-ACETAMINOPHEN 10-325 MG PO TABS: 1.0000 | ORAL_TABLET | Freq: Three times a day (TID) | ORAL | 0 refills | Status: DC | PRN

## 2021-08-07 NOTE — Progress Notes (Signed)
? ?Subjective:  ? ? Patient ID: Ashley Houston, female    DOB: 1940/10/24, 81 y.o.   MRN: [id number] ? ?HPI: Ashley Houston is a 81 y.o. female who returns for follow up appointment for chronic pain and medication refill. She states her  pain is located in her lower back radiating into her left hip and left knee pain. She  rates her pain 6. Her current exercise regime is walking with her cane.  ? ?Ms. Denunzio was admitted to [facility] on  07/21/2021 for Community Acquired Pneumonia. She was discharged Home on 07/27/2021. Discharge summary was reviewed. ? ?Ms. Claw Morphine equivalent is 45.00 MME.   Oral Swab was Performed today.  ? ?Pain Inventory ?Average Pain 9 ?Pain Right Now 6 ?My pain is intermittent, dull, and aching ? ?In the last 24 hours, has pain interfered with the following? ?General activity 4 ?Relation with others 2 ?Enjoyment of life 5 ?What TIME of day is your pain at its worst? morning , daytime, and evening ?Sleep (in general) Good ? ?Pain is worse with: walking, bending, sitting, and standing ?Pain improves with: rest, pacing activities, medication, and heat ?Relief from Meds: 9 ? ?History reviewed. No pertinent family history. ?Social History  ? ?Socioeconomic History  ? Marital status: Widowed  ?  Spouse name: Not on file  ? Number of children: Not on file  ? Years of education: Not on file  ? Highest education level: Not on file  ?Occupational History  ? Not on file  ?Tobacco Use  ? Smoking status: Every Day  ?  Packs/day: 0.50  ?  Types: Cigarettes  ? Smokeless tobacco: Never  ?Vaping Use  ? Vaping Use: Former  ?Substance and Sexual Activity  ? Alcohol use: No  ? Drug use: No  ? Sexual activity: Not on file  ?Other Topics Concern  ? Not on file  ?Social History Narrative  ? Not on file  ? ?Social Determinants of Health  ? ?Financial Resource Strain: Not on file  ?Food Insecurity: Not on file  ?Transportation Needs: Not on file  ?Physical Activity: Not on file  ?Stress: Not on file   ?Social Connections: Not on file  ? ?Past Surgical History:  ?Procedure Laterality Date  ? ABDOMINAL HYSTERECTOMY    ? CHOLECYSTECTOMY    ? TONSILLECTOMY    ? ?Past Surgical History:  ?Procedure Laterality Date  ? ABDOMINAL HYSTERECTOMY    ? CHOLECYSTECTOMY    ? TONSILLECTOMY    ? ?Past Medical History:  ?Diagnosis Date  ? Anxiety   ? Chronic diastolic heart failure (HCC)   ? Chronic pain   ? hips back and shoulders  ? COPD (chronic obstructive pulmonary disease) (HCC)   ? Depression   ? Hypothyroidism 09/08/2015  ? Tachycardia   ? Thyroid disease   ? ?BP 108/64   Pulse 68   Ht 5\' 6"  (1.676 m)   Wt 128 lb (58.1 kg)   BMI 20.66 kg/m?  ? ?Opioid Risk Score:   ?Fall Risk Score:  `1 ? ?Depression screen PHQ 2/9 ? ? ?  08/07/2021  ? 10:55 AM 03/27/2021  ? 12:44 PM 01/29/2021  ? 12:59 PM 06/27/2020  ? 12:52 PM 04/25/2020  ? 11:11 AM 01/04/2020  ? 11:02 AM 11/02/2019  ? 12:58 PM  ?Depression screen PHQ 2/9  ?Decreased Interest 0 0 0 0 0 0 0  ?Down, Depressed, Hopeless  0 0 0 0 0 0  ?PHQ - 2 Score 0 0 0 0  0 0 0  ?Altered sleeping       0  ?Tired, decreased energy       0  ?Change in appetite       0  ?Feeling bad or failure about yourself        1  ?Trouble concentrating       0  ?Moving slowly or fidgety/restless       0  ?Suicidal thoughts       0  ?PHQ-9 Score       1  ? ? ?Review of Systems  ?Musculoskeletal:  Positive for back pain and gait problem.  ?     Left knee a pain ?Left hip pain ?Left knee pain  ?All other systems reviewed and are negative. ? ?   ?Objective:  ? Physical Exam ?Vitals and nursing note reviewed.  ?Constitutional:   ?   Appearance: Normal appearance.  ?Cardiovascular:  ?   Rate and Rhythm: Normal rate and regular rhythm.  ?   Pulses: Normal pulses.  ?   Heart sounds: Normal heart sounds.  ?Pulmonary:  ?   Effort: Pulmonary effort is normal.  ?   Breath sounds: Normal breath sounds.  ?Musculoskeletal:  ?   Cervical back: Normal range of motion and neck supple.  ?   Comments: Normal Muscle Bulk and  Muscle Testing Reveals:  ?Upper Extremities: Full ROM and Muscle Strength 5/5 ?Lumbar Paraspinal Tenderness: L-4-L-5 ?Left Greater Trochanter Tenderness ?Lower Extremities: Full ROM and Muscle Strength 5/5 ?Arises from Table slowly using cane for support ?Antalgic Gait  ?   ?Skin: ?   General: Skin is warm and dry.  ?Neurological:  ?   Mental Status: She is alert and oriented to person, place, and time.  ?Psychiatric:     ?   Mood and Affect: Mood normal.     ?   Behavior: Behavior normal.  ? ? ? ? ?   ?Assessment & Plan:  ?1. Left Lumbar Radiculitis/ Chronic low back pain likely related to lumbar spondylosis and facet arthropathy: Continue HEP as Tolerated. 08/07/2021. ?2. OA of Left Knee: .Refilled: Oxycodone 10/325 mg one tablet every 8 hours as needed #90. Second script given for the following month, to give her time to purchase portable oxygen tank. 08/07/2021 ?We will continue the opioid monitoring program, this consists of regular clinic visits, examinations, urine drug screen, pill counts as well as use of [place] Controlled Substance Reporting system. A 12 month History has been reviewed on the [place] Controlled Substance Reporting System on 08/07/2021. ?3. Muscle Spasms: Continue current medication regimen with Flexeril as needed. 08/07/2021 ?4. Left Greater Trochanter Bursitis: Continue to Alternate with  Heat and Ice Therapy. Continue to Monitor. 08/07/2021 ? ?F/U in 2 months ?  ? ? ? ? ? ? ? ?

## 2021-08-08 LAB — PAT ID TIQ DOC

## 2021-08-08 LAB — DRUG TOX ALC METAB W/CON, ORAL FLD

## 2021-08-08 LAB — DRUG TOX MONITOR 1 W/CONF, ORAL FLD

## 2021-08-27 DIAGNOSIS — E875 Hyperkalemia: Secondary | ICD-10-CM | POA: Diagnosis not present

## 2021-09-03 DIAGNOSIS — I10 Essential (primary) hypertension: Secondary | ICD-10-CM | POA: Diagnosis not present

## 2021-09-03 DIAGNOSIS — D649 Anemia, unspecified: Secondary | ICD-10-CM | POA: Diagnosis not present

## 2021-09-03 DIAGNOSIS — I350 Nonrheumatic aortic (valve) stenosis: Secondary | ICD-10-CM | POA: Diagnosis not present

## 2021-09-03 DIAGNOSIS — K5900 Constipation, unspecified: Secondary | ICD-10-CM | POA: Diagnosis not present

## 2021-09-17 ENCOUNTER — Inpatient Hospital Stay: Admission: AD | Admit: 2021-09-17 | Payer: Medicare Other | Admitting: Internal Medicine

## 2021-09-17 DIAGNOSIS — I214 Non-ST elevation (NSTEMI) myocardial infarction: Secondary | ICD-10-CM | POA: Diagnosis not present

## 2021-09-17 DIAGNOSIS — J189 Pneumonia, unspecified organism: Secondary | ICD-10-CM | POA: Diagnosis not present

## 2021-09-17 DIAGNOSIS — Z20822 Contact with and (suspected) exposure to covid-19: Secondary | ICD-10-CM | POA: Diagnosis not present

## 2021-09-17 DIAGNOSIS — I509 Heart failure, unspecified: Secondary | ICD-10-CM | POA: Diagnosis not present

## 2021-09-17 DIAGNOSIS — J90 Pleural effusion, not elsewhere classified: Secondary | ICD-10-CM | POA: Diagnosis not present

## 2021-09-17 DIAGNOSIS — R0600 Dyspnea, unspecified: Secondary | ICD-10-CM | POA: Diagnosis not present

## 2021-09-18 DIAGNOSIS — J9691 Respiratory failure, unspecified with hypoxia: Secondary | ICD-10-CM | POA: Diagnosis not present

## 2021-09-18 DIAGNOSIS — E876 Hypokalemia: Secondary | ICD-10-CM | POA: Diagnosis not present

## 2021-09-18 DIAGNOSIS — J811 Chronic pulmonary edema: Secondary | ICD-10-CM | POA: Diagnosis not present

## 2021-09-18 DIAGNOSIS — I5030 Unspecified diastolic (congestive) heart failure: Secondary | ICD-10-CM | POA: Diagnosis not present

## 2021-09-18 DIAGNOSIS — I350 Nonrheumatic aortic (valve) stenosis: Secondary | ICD-10-CM | POA: Diagnosis not present

## 2021-09-18 DIAGNOSIS — R0902 Hypoxemia: Secondary | ICD-10-CM | POA: Diagnosis not present

## 2021-09-18 DIAGNOSIS — I214 Non-ST elevation (NSTEMI) myocardial infarction: Secondary | ICD-10-CM | POA: Diagnosis not present

## 2021-09-19 DIAGNOSIS — I5030 Unspecified diastolic (congestive) heart failure: Secondary | ICD-10-CM | POA: Diagnosis not present

## 2021-09-19 DIAGNOSIS — R0902 Hypoxemia: Secondary | ICD-10-CM | POA: Diagnosis not present

## 2021-09-19 DIAGNOSIS — I701 Atherosclerosis of renal artery: Secondary | ICD-10-CM | POA: Diagnosis not present

## 2021-09-19 DIAGNOSIS — I350 Nonrheumatic aortic (valve) stenosis: Secondary | ICD-10-CM | POA: Diagnosis not present

## 2021-09-19 DIAGNOSIS — I4891 Unspecified atrial fibrillation: Secondary | ICD-10-CM | POA: Diagnosis not present

## 2021-09-20 DIAGNOSIS — I5031 Acute diastolic (congestive) heart failure: Secondary | ICD-10-CM | POA: Diagnosis not present

## 2021-09-20 DIAGNOSIS — R0902 Hypoxemia: Secondary | ICD-10-CM | POA: Diagnosis not present

## 2021-09-20 DIAGNOSIS — I350 Nonrheumatic aortic (valve) stenosis: Secondary | ICD-10-CM | POA: Diagnosis not present

## 2021-09-20 DIAGNOSIS — I4891 Unspecified atrial fibrillation: Secondary | ICD-10-CM | POA: Diagnosis not present

## 2021-09-21 DIAGNOSIS — I5031 Acute diastolic (congestive) heart failure: Secondary | ICD-10-CM | POA: Diagnosis not present

## 2021-09-21 DIAGNOSIS — I350 Nonrheumatic aortic (valve) stenosis: Secondary | ICD-10-CM | POA: Diagnosis not present

## 2021-09-21 DIAGNOSIS — R0902 Hypoxemia: Secondary | ICD-10-CM | POA: Diagnosis not present

## 2021-09-21 DIAGNOSIS — I4891 Unspecified atrial fibrillation: Secondary | ICD-10-CM | POA: Diagnosis not present

## 2021-09-22 DIAGNOSIS — I4891 Unspecified atrial fibrillation: Secondary | ICD-10-CM | POA: Diagnosis not present

## 2021-09-22 DIAGNOSIS — I5031 Acute diastolic (congestive) heart failure: Secondary | ICD-10-CM | POA: Diagnosis not present

## 2021-09-22 DIAGNOSIS — R0902 Hypoxemia: Secondary | ICD-10-CM | POA: Diagnosis not present

## 2021-09-22 DIAGNOSIS — I350 Nonrheumatic aortic (valve) stenosis: Secondary | ICD-10-CM | POA: Diagnosis not present

## 2021-09-22 DIAGNOSIS — R000 Tachycardia, unspecified: Secondary | ICD-10-CM | POA: Diagnosis not present

## 2021-09-23 DIAGNOSIS — I4891 Unspecified atrial fibrillation: Secondary | ICD-10-CM | POA: Diagnosis not present

## 2021-09-23 DIAGNOSIS — R0902 Hypoxemia: Secondary | ICD-10-CM | POA: Diagnosis not present

## 2021-09-23 DIAGNOSIS — I350 Nonrheumatic aortic (valve) stenosis: Secondary | ICD-10-CM | POA: Diagnosis not present

## 2021-09-23 DIAGNOSIS — I5031 Acute diastolic (congestive) heart failure: Secondary | ICD-10-CM | POA: Diagnosis not present

## 2021-09-24 DIAGNOSIS — I5031 Acute diastolic (congestive) heart failure: Secondary | ICD-10-CM | POA: Diagnosis not present

## 2021-09-24 DIAGNOSIS — Z952 Presence of prosthetic heart valve: Secondary | ICD-10-CM | POA: Diagnosis not present

## 2021-09-24 DIAGNOSIS — I4891 Unspecified atrial fibrillation: Secondary | ICD-10-CM | POA: Diagnosis not present

## 2021-09-24 DIAGNOSIS — I350 Nonrheumatic aortic (valve) stenosis: Secondary | ICD-10-CM | POA: Diagnosis not present

## 2021-09-24 DIAGNOSIS — R0902 Hypoxemia: Secondary | ICD-10-CM | POA: Diagnosis not present

## 2021-09-24 DIAGNOSIS — I509 Heart failure, unspecified: Secondary | ICD-10-CM | POA: Diagnosis not present

## 2021-09-24 DIAGNOSIS — Z006 Encounter for examination for normal comparison and control in clinical research program: Secondary | ICD-10-CM | POA: Diagnosis not present

## 2021-09-25 DIAGNOSIS — J984 Other disorders of lung: Secondary | ICD-10-CM | POA: Diagnosis not present

## 2021-09-25 DIAGNOSIS — I4891 Unspecified atrial fibrillation: Secondary | ICD-10-CM | POA: Diagnosis not present

## 2021-09-25 DIAGNOSIS — Z952 Presence of prosthetic heart valve: Secondary | ICD-10-CM | POA: Diagnosis not present

## 2021-09-25 DIAGNOSIS — J811 Chronic pulmonary edema: Secondary | ICD-10-CM | POA: Diagnosis not present

## 2021-09-25 DIAGNOSIS — I6523 Occlusion and stenosis of bilateral carotid arteries: Secondary | ICD-10-CM | POA: Diagnosis not present

## 2021-09-25 DIAGNOSIS — R569 Unspecified convulsions: Secondary | ICD-10-CM | POA: Diagnosis not present

## 2021-09-25 DIAGNOSIS — I350 Nonrheumatic aortic (valve) stenosis: Secondary | ICD-10-CM | POA: Diagnosis not present

## 2021-09-25 DIAGNOSIS — J90 Pleural effusion, not elsewhere classified: Secondary | ICD-10-CM | POA: Diagnosis not present

## 2021-09-25 DIAGNOSIS — N179 Acute kidney failure, unspecified: Secondary | ICD-10-CM | POA: Diagnosis not present

## 2021-09-25 DIAGNOSIS — I6782 Cerebral ischemia: Secondary | ICD-10-CM | POA: Diagnosis not present

## 2021-09-25 DIAGNOSIS — I5030 Unspecified diastolic (congestive) heart failure: Secondary | ICD-10-CM | POA: Diagnosis not present

## 2021-09-25 DIAGNOSIS — Z9889 Other specified postprocedural states: Secondary | ICD-10-CM | POA: Diagnosis not present

## 2021-09-25 DIAGNOSIS — R0602 Shortness of breath: Secondary | ICD-10-CM | POA: Diagnosis not present

## 2021-09-25 DIAGNOSIS — J9811 Atelectasis: Secondary | ICD-10-CM | POA: Diagnosis not present

## 2021-09-25 DIAGNOSIS — I6381 Other cerebral infarction due to occlusion or stenosis of small artery: Secondary | ICD-10-CM | POA: Diagnosis not present

## 2021-09-25 DIAGNOSIS — R109 Unspecified abdominal pain: Secondary | ICD-10-CM | POA: Diagnosis not present

## 2021-09-26 DIAGNOSIS — R0602 Shortness of breath: Secondary | ICD-10-CM | POA: Diagnosis not present

## 2021-09-26 DIAGNOSIS — N179 Acute kidney failure, unspecified: Secondary | ICD-10-CM | POA: Diagnosis not present

## 2021-09-26 DIAGNOSIS — I4891 Unspecified atrial fibrillation: Secondary | ICD-10-CM | POA: Diagnosis not present

## 2021-09-26 DIAGNOSIS — I5030 Unspecified diastolic (congestive) heart failure: Secondary | ICD-10-CM | POA: Diagnosis not present

## 2021-09-26 DIAGNOSIS — I350 Nonrheumatic aortic (valve) stenosis: Secondary | ICD-10-CM | POA: Diagnosis not present

## 2021-09-27 DIAGNOSIS — I4891 Unspecified atrial fibrillation: Secondary | ICD-10-CM | POA: Diagnosis not present

## 2021-09-27 DIAGNOSIS — I350 Nonrheumatic aortic (valve) stenosis: Secondary | ICD-10-CM | POA: Diagnosis not present

## 2021-09-27 DIAGNOSIS — J9621 Acute and chronic respiratory failure with hypoxia: Secondary | ICD-10-CM | POA: Diagnosis not present

## 2021-09-27 DIAGNOSIS — R4182 Altered mental status, unspecified: Secondary | ICD-10-CM | POA: Diagnosis not present

## 2021-09-27 DIAGNOSIS — R0603 Acute respiratory distress: Secondary | ICD-10-CM | POA: Diagnosis not present

## 2021-10-02 ENCOUNTER — Encounter: Payer: Medicare Other | Admitting: Registered Nurse

## 2021-10-10 DEATH — deceased

## 2021-12-23 ENCOUNTER — Ambulatory Visit: Payer: Medicare Other | Admitting: Physical Medicine and Rehabilitation

## 2022-11-28 IMAGING — DX DG LUMBAR SPINE 2-3V
3 series · 3 of 3 positions shown · non-contrast
Comparison: September 21, 2019

CLINICAL DATA: Chronic low back pain

EXAM:
LUMBAR SPINE - 2-3 VIEW

[l-spine ap]
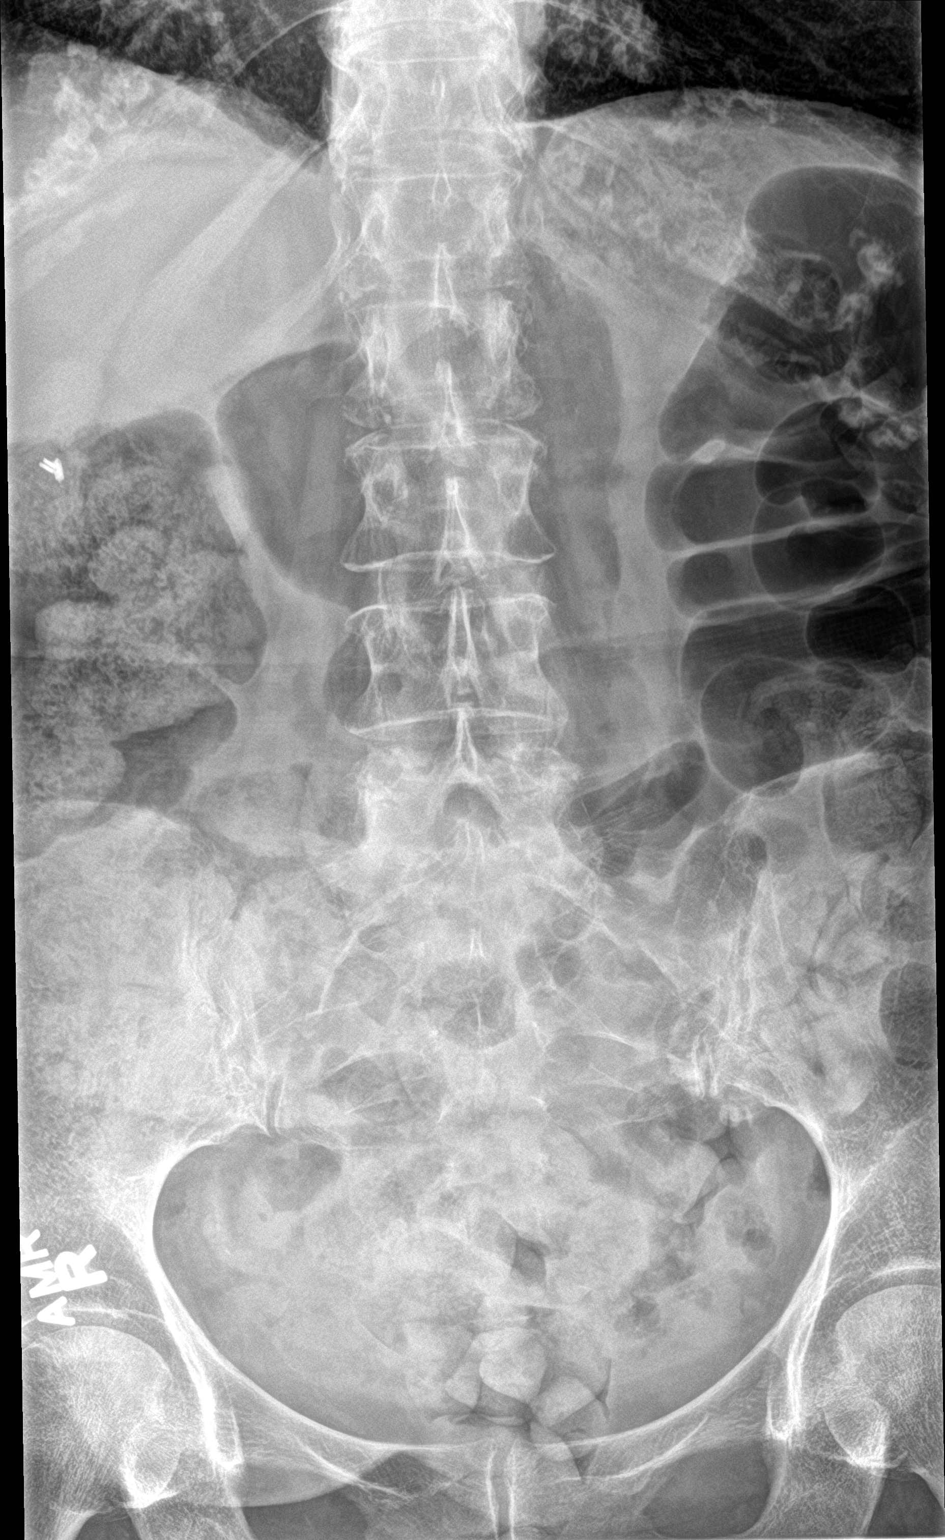

[l-spine lat]
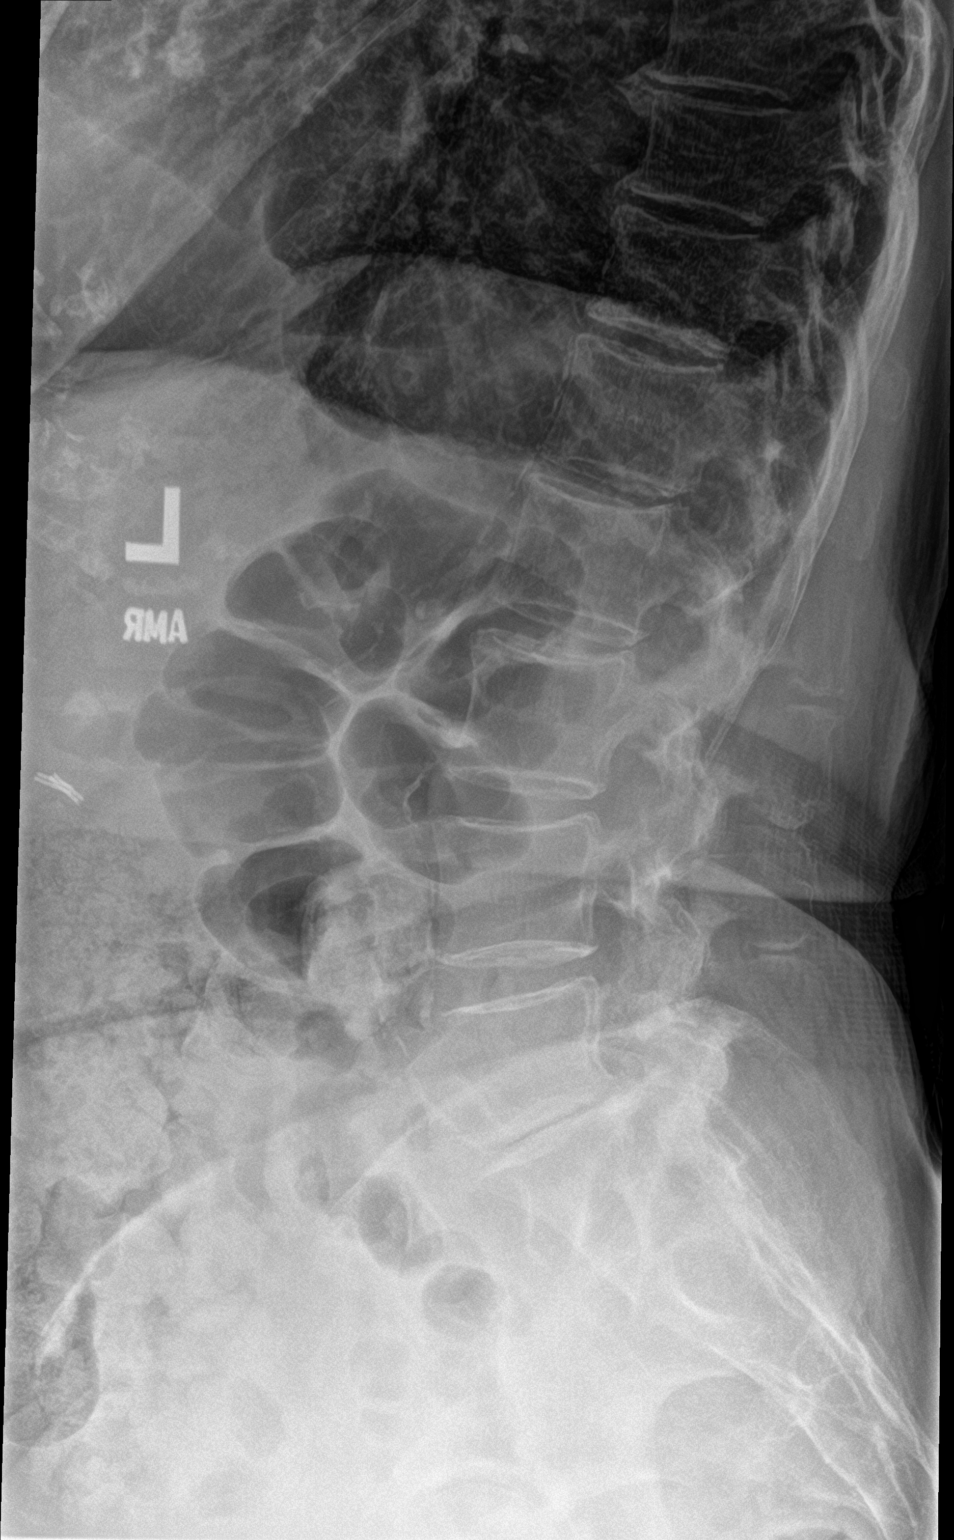

[l-spine spot]
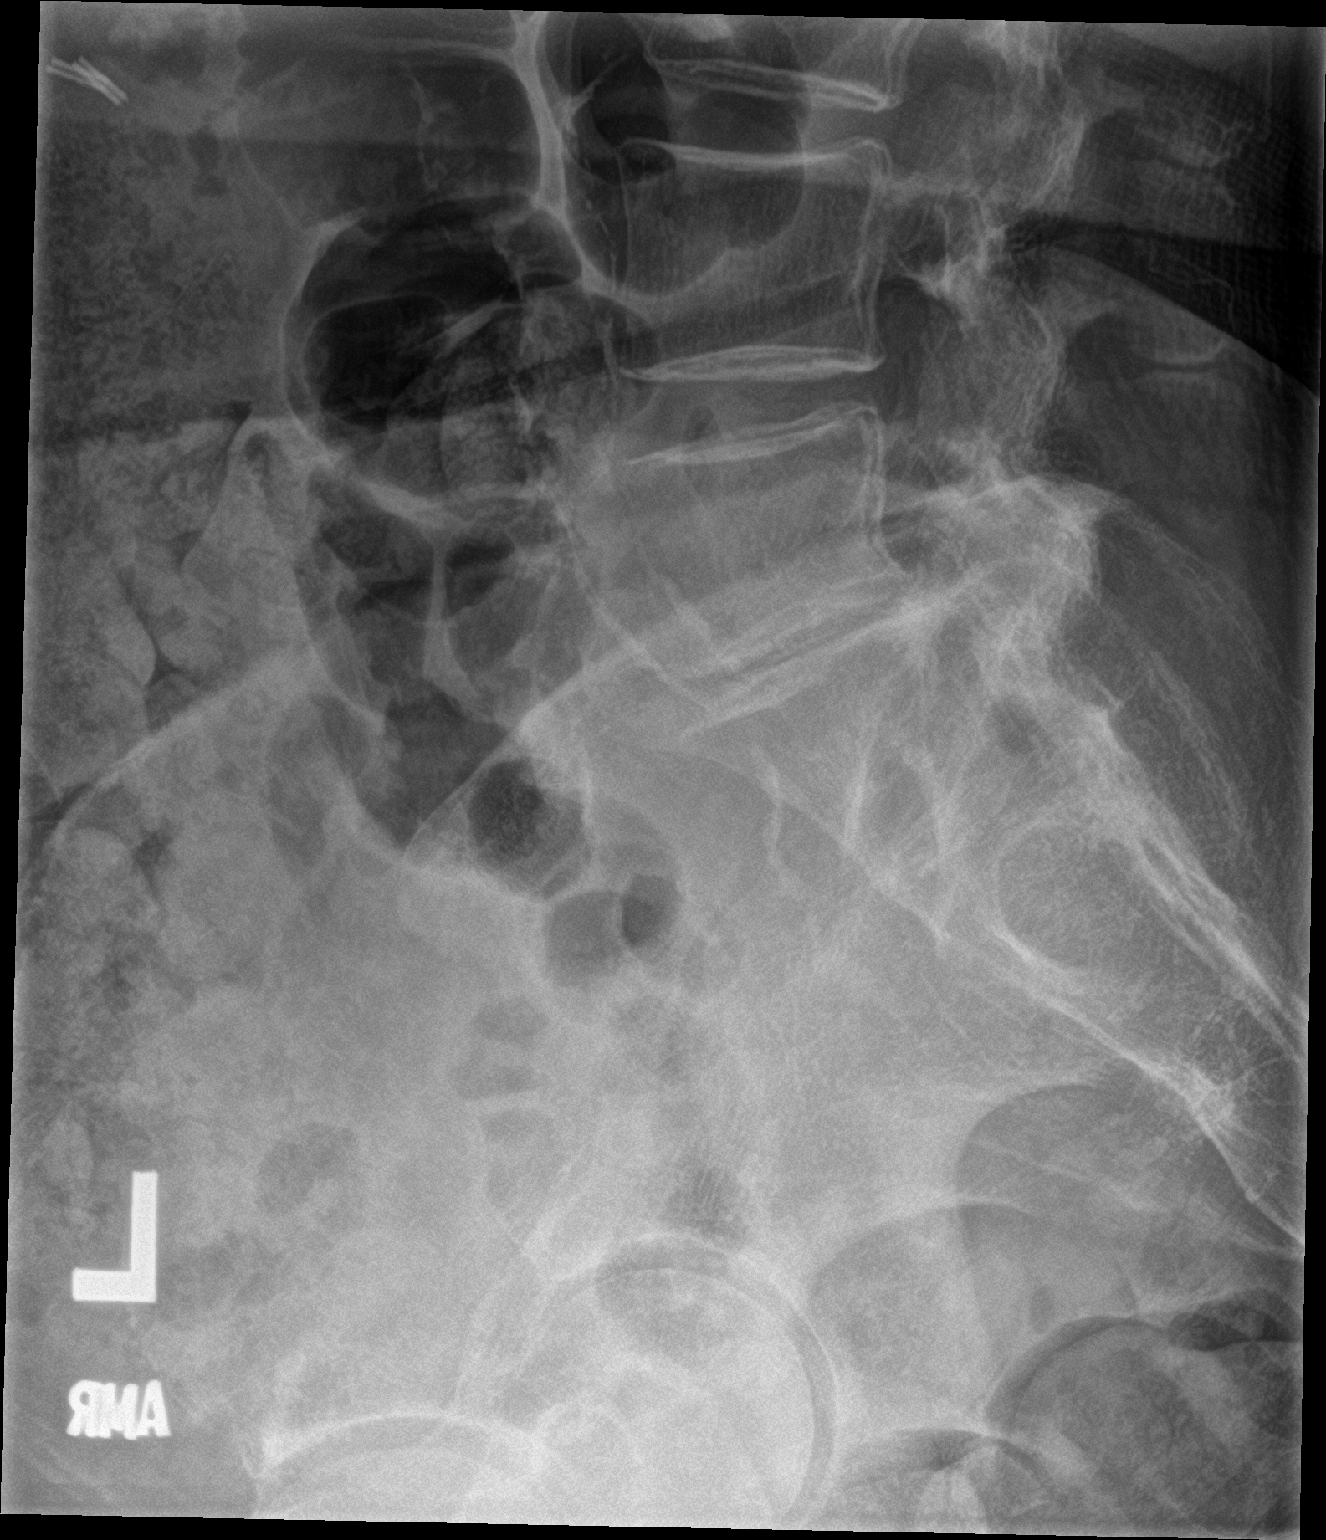

[3 of 3 positions shown; findings below may reference images not displayed]

FINDINGS: Frontal, lateral and spot lumbosacral lateral images were obtained.
There are 5 non-rib-bearing lumbar type vertebral bodies. There is
no appreciable fracture or spondylolisthesis. There is moderately
severe disc space narrowing at L5-S1. Other disc spaces appear
unremarkable. Facet osteoarthritic change noted at L5-S1
bilaterally. There is calcification within the aorta.
IMPRESSION: There is osteoarthritic change at L5-S1. Other disc spaces appear
unremarkable. No fracture or spondylolisthesis.

Aortic Atherosclerosis ([id number]).
